# Patient Record
Sex: Female | Born: 1945 | Race: White | Hispanic: No | Smoking: Never smoker
Health system: Southern US, Community
[De-identification: ages and names within clinical notes are randomized; demographics above are authoritative.]

## PROBLEM LIST (undated history)

## (undated) DIAGNOSIS — K7460 Unspecified cirrhosis of liver: Secondary | ICD-10-CM

## (undated) DIAGNOSIS — R0600 Dyspnea, unspecified: Secondary | ICD-10-CM

## (undated) DIAGNOSIS — M109 Gout, unspecified: Secondary | ICD-10-CM

## (undated) DIAGNOSIS — R0609 Other forms of dyspnea: Secondary | ICD-10-CM

## (undated) DIAGNOSIS — E785 Hyperlipidemia, unspecified: Secondary | ICD-10-CM

## (undated) DIAGNOSIS — E119 Type 2 diabetes mellitus without complications: Secondary | ICD-10-CM

## (undated) DIAGNOSIS — E039 Hypothyroidism, unspecified: Secondary | ICD-10-CM

## (undated) DIAGNOSIS — I10 Essential (primary) hypertension: Secondary | ICD-10-CM

## (undated) DIAGNOSIS — R000 Tachycardia, unspecified: Secondary | ICD-10-CM

## (undated) DIAGNOSIS — K31819 Angiodysplasia of stomach and duodenum without bleeding: Secondary | ICD-10-CM

## (undated) HISTORY — DX: Tachycardia, unspecified: R00.0

## (undated) HISTORY — DX: Type 2 diabetes mellitus without complications: E11.9

## (undated) HISTORY — DX: Other forms of dyspnea: R06.09

## (undated) HISTORY — DX: Dyspnea, unspecified: R06.00

## (undated) HISTORY — DX: Angiodysplasia of stomach and duodenum without bleeding: K31.819

## (undated) HISTORY — DX: Essential (primary) hypertension: I10

## (undated) HISTORY — DX: Hypothyroidism, unspecified: E03.9

## (undated) HISTORY — DX: Gout, unspecified: M10.9

## (undated) HISTORY — PX: BREAST REDUCTION SURGERY: SHX8

## (undated) HISTORY — DX: Unspecified cirrhosis of liver: K74.60

## (undated) HISTORY — PX: REDUCTION MAMMAPLASTY: SUR839

## (undated) HISTORY — DX: Hyperlipidemia, unspecified: E78.5

## (undated) HISTORY — PX: OTHER SURGICAL HISTORY: SHX169

## (undated) HISTORY — PX: CHOLECYSTECTOMY: SHX55

---

## 2000-03-07 ENCOUNTER — Encounter: Payer: Self-pay | Admitting: Family Medicine

## 2000-03-07 ENCOUNTER — Encounter: Admission: RE | Admit: 2000-03-07 | Discharge: 2000-03-07 | Payer: Self-pay | Admitting: Family Medicine

## 2001-03-13 ENCOUNTER — Encounter: Payer: Self-pay | Admitting: Family Medicine

## 2001-03-13 ENCOUNTER — Encounter: Admission: RE | Admit: 2001-03-13 | Discharge: 2001-03-13 | Payer: Self-pay | Admitting: Family Medicine

## 2002-03-18 ENCOUNTER — Encounter: Payer: Self-pay | Admitting: Family Medicine

## 2002-03-18 ENCOUNTER — Encounter: Admission: RE | Admit: 2002-03-18 | Discharge: 2002-03-18 | Payer: Self-pay | Admitting: Family Medicine

## 2003-03-27 ENCOUNTER — Encounter: Payer: Self-pay | Admitting: Internal Medicine

## 2003-03-27 ENCOUNTER — Encounter: Admission: RE | Admit: 2003-03-27 | Discharge: 2003-03-27 | Payer: Self-pay | Admitting: Internal Medicine

## 2004-03-28 ENCOUNTER — Ambulatory Visit (HOSPITAL_COMMUNITY): Admission: RE | Admit: 2004-03-28 | Discharge: 2004-03-28 | Payer: Self-pay | Admitting: Internal Medicine

## 2004-03-30 ENCOUNTER — Encounter (INDEPENDENT_AMBULATORY_CARE_PROVIDER_SITE_OTHER): Payer: Self-pay | Admitting: Specialist

## 2004-03-30 ENCOUNTER — Encounter: Admission: RE | Admit: 2004-03-30 | Discharge: 2004-03-30 | Payer: Self-pay | Admitting: Internal Medicine

## 2004-09-20 ENCOUNTER — Ambulatory Visit: Payer: Self-pay | Admitting: Internal Medicine

## 2005-05-01 ENCOUNTER — Ambulatory Visit (HOSPITAL_COMMUNITY): Admission: RE | Admit: 2005-05-01 | Discharge: 2005-05-01 | Payer: Self-pay | Admitting: Internal Medicine

## 2005-07-20 ENCOUNTER — Ambulatory Visit: Payer: Self-pay | Admitting: Internal Medicine

## 2006-05-25 ENCOUNTER — Ambulatory Visit (HOSPITAL_COMMUNITY): Admission: RE | Admit: 2006-05-25 | Discharge: 2006-05-25 | Payer: Self-pay | Admitting: Family Medicine

## 2007-05-28 ENCOUNTER — Ambulatory Visit (HOSPITAL_COMMUNITY): Admission: RE | Admit: 2007-05-28 | Discharge: 2007-05-28 | Payer: Self-pay | Admitting: Family Medicine

## 2008-06-09 ENCOUNTER — Ambulatory Visit (HOSPITAL_COMMUNITY): Admission: RE | Admit: 2008-06-09 | Discharge: 2008-06-09 | Payer: Self-pay | Admitting: Family Medicine

## 2009-07-28 ENCOUNTER — Ambulatory Visit (HOSPITAL_COMMUNITY): Admission: RE | Admit: 2009-07-28 | Discharge: 2009-07-28 | Payer: Self-pay | Admitting: Family Medicine

## 2010-08-02 ENCOUNTER — Ambulatory Visit (HOSPITAL_COMMUNITY): Admission: RE | Admit: 2010-08-02 | Discharge: 2010-08-02 | Payer: Self-pay | Admitting: Family Medicine

## 2010-11-07 ENCOUNTER — Encounter
Admission: RE | Admit: 2010-11-07 | Discharge: 2010-11-07 | Payer: Self-pay | Source: Home / Self Care | Attending: Obstetrics and Gynecology | Admitting: Obstetrics and Gynecology

## 2011-01-02 ENCOUNTER — Other Ambulatory Visit: Payer: Self-pay | Admitting: Cardiovascular Disease

## 2011-01-02 DIAGNOSIS — I10 Essential (primary) hypertension: Secondary | ICD-10-CM

## 2011-01-03 ENCOUNTER — Other Ambulatory Visit: Payer: Self-pay | Admitting: *Deleted

## 2011-01-03 DIAGNOSIS — I10 Essential (primary) hypertension: Secondary | ICD-10-CM

## 2011-01-03 NOTE — Telephone Encounter (Signed)
Pt requesting refill on

## 2011-01-05 NOTE — Telephone Encounter (Signed)
[facility] Cardiology

## 2011-07-18 ENCOUNTER — Other Ambulatory Visit: Payer: Self-pay | Admitting: Family Medicine

## 2011-07-18 DIAGNOSIS — R7989 Other specified abnormal findings of blood chemistry: Secondary | ICD-10-CM

## 2011-07-20 ENCOUNTER — Ambulatory Visit
Admission: RE | Admit: 2011-07-20 | Discharge: 2011-07-20 | Disposition: A | Discharge status: Home / Self Care | Payer: Medicare Other | Source: Ambulatory Visit | Attending: Family Medicine | Admitting: Family Medicine

## 2011-07-20 DIAGNOSIS — R7989 Other specified abnormal findings of blood chemistry: Secondary | ICD-10-CM

## 2012-01-06 ENCOUNTER — Other Ambulatory Visit: Payer: Self-pay | Admitting: Cardiovascular Disease

## 2012-01-08 NOTE — Telephone Encounter (Signed)
Pt needs appointment then refill can be made Fax Received. Refill Completed. Alvis Pulcini Chowoe (R.M.A)   

## 2012-03-21 ENCOUNTER — Encounter: Payer: Self-pay | Admitting: *Deleted

## 2012-04-17 ENCOUNTER — Other Ambulatory Visit (HOSPITAL_COMMUNITY): Payer: Self-pay | Admitting: Family Medicine

## 2012-04-17 DIAGNOSIS — Z1231 Encounter for screening mammogram for malignant neoplasm of breast: Secondary | ICD-10-CM

## 2012-05-07 ENCOUNTER — Ambulatory Visit (HOSPITAL_COMMUNITY)
Admission: RE | Admit: 2012-05-07 | Discharge: 2012-05-07 | Disposition: A | Discharge status: Home / Self Care | Payer: Medicare Other | Source: Ambulatory Visit | Attending: Family Medicine | Admitting: Family Medicine

## 2012-05-07 DIAGNOSIS — Z1231 Encounter for screening mammogram for malignant neoplasm of breast: Secondary | ICD-10-CM | POA: Insufficient documentation

## 2012-12-02 ENCOUNTER — Encounter: Payer: Self-pay | Admitting: Cardiovascular Disease

## 2013-06-24 ENCOUNTER — Other Ambulatory Visit (HOSPITAL_COMMUNITY): Payer: Self-pay | Admitting: Family Medicine

## 2013-06-24 DIAGNOSIS — Z1231 Encounter for screening mammogram for malignant neoplasm of breast: Secondary | ICD-10-CM

## 2013-07-03 ENCOUNTER — Ambulatory Visit (HOSPITAL_COMMUNITY)
Admission: RE | Admit: 2013-07-03 | Discharge: 2013-07-03 | Disposition: A | Discharge status: Home / Self Care | Payer: Medicare Other | Source: Ambulatory Visit | Attending: Family Medicine | Admitting: Family Medicine

## 2013-07-03 ENCOUNTER — Other Ambulatory Visit (HOSPITAL_COMMUNITY): Payer: Self-pay | Admitting: Family Medicine

## 2013-07-03 DIAGNOSIS — Z1231 Encounter for screening mammogram for malignant neoplasm of breast: Secondary | ICD-10-CM

## 2013-07-16 ENCOUNTER — Encounter: Payer: Self-pay | Admitting: Gastroenterology

## 2014-07-23 ENCOUNTER — Other Ambulatory Visit (HOSPITAL_COMMUNITY): Payer: Self-pay | Admitting: Family Medicine

## 2014-07-23 DIAGNOSIS — Z1231 Encounter for screening mammogram for malignant neoplasm of breast: Secondary | ICD-10-CM

## 2014-08-05 ENCOUNTER — Ambulatory Visit (HOSPITAL_COMMUNITY)
Admission: RE | Admit: 2014-08-05 | Discharge: 2014-08-05 | Disposition: A | Discharge status: Home / Self Care | Payer: Medicare HMO | Source: Ambulatory Visit | Attending: Family Medicine | Admitting: Family Medicine

## 2014-08-05 DIAGNOSIS — Z1231 Encounter for screening mammogram for malignant neoplasm of breast: Secondary | ICD-10-CM

## 2015-12-03 ENCOUNTER — Other Ambulatory Visit: Payer: Self-pay | Admitting: Family Medicine

## 2015-12-03 DIAGNOSIS — E2839 Other primary ovarian failure: Secondary | ICD-10-CM

## 2015-12-03 DIAGNOSIS — Z1231 Encounter for screening mammogram for malignant neoplasm of breast: Secondary | ICD-10-CM

## 2015-12-03 DIAGNOSIS — R5381 Other malaise: Secondary | ICD-10-CM

## 2015-12-28 ENCOUNTER — Ambulatory Visit
Admission: RE | Admit: 2015-12-28 | Discharge: 2015-12-28 | Disposition: A | Discharge status: Home / Self Care | Payer: Medicare Other | Source: Ambulatory Visit | Attending: Family Medicine | Admitting: Family Medicine

## 2015-12-28 DIAGNOSIS — E2839 Other primary ovarian failure: Secondary | ICD-10-CM

## 2015-12-28 DIAGNOSIS — Z1231 Encounter for screening mammogram for malignant neoplasm of breast: Secondary | ICD-10-CM

## 2017-08-21 ENCOUNTER — Other Ambulatory Visit: Payer: Self-pay | Admitting: Family Medicine

## 2017-08-21 DIAGNOSIS — Z1231 Encounter for screening mammogram for malignant neoplasm of breast: Secondary | ICD-10-CM

## 2017-09-20 ENCOUNTER — Ambulatory Visit: Payer: Medicare Other

## 2017-10-19 ENCOUNTER — Ambulatory Visit
Admission: RE | Admit: 2017-10-19 | Discharge: 2017-10-19 | Disposition: A | Discharge status: Home / Self Care | Payer: Medicare Other | Source: Ambulatory Visit | Attending: Family Medicine | Admitting: Family Medicine

## 2017-10-19 DIAGNOSIS — Z1231 Encounter for screening mammogram for malignant neoplasm of breast: Secondary | ICD-10-CM

## 2019-09-30 ENCOUNTER — Other Ambulatory Visit: Payer: Self-pay | Admitting: Family Medicine

## 2019-09-30 DIAGNOSIS — Z1231 Encounter for screening mammogram for malignant neoplasm of breast: Secondary | ICD-10-CM

## 2020-09-09 ENCOUNTER — Other Ambulatory Visit: Payer: Self-pay | Admitting: Family Medicine

## 2020-09-09 DIAGNOSIS — Z1231 Encounter for screening mammogram for malignant neoplasm of breast: Secondary | ICD-10-CM

## 2020-09-10 ENCOUNTER — Ambulatory Visit
Admission: RE | Admit: 2020-09-10 | Discharge: 2020-09-10 | Disposition: A | Discharge status: Home / Self Care | Payer: Medicare Other | Source: Ambulatory Visit | Attending: Family Medicine | Admitting: Family Medicine

## 2020-09-10 ENCOUNTER — Other Ambulatory Visit: Payer: Self-pay

## 2020-09-10 DIAGNOSIS — Z1231 Encounter for screening mammogram for malignant neoplasm of breast: Secondary | ICD-10-CM

## 2020-10-29 DIAGNOSIS — I10 Essential (primary) hypertension: Secondary | ICD-10-CM | POA: Diagnosis not present

## 2020-10-29 DIAGNOSIS — E782 Mixed hyperlipidemia: Secondary | ICD-10-CM | POA: Diagnosis not present

## 2020-10-29 DIAGNOSIS — E1165 Type 2 diabetes mellitus with hyperglycemia: Secondary | ICD-10-CM | POA: Diagnosis not present

## 2020-10-29 DIAGNOSIS — E1169 Type 2 diabetes mellitus with other specified complication: Secondary | ICD-10-CM | POA: Diagnosis not present

## 2020-10-29 DIAGNOSIS — N1831 Chronic kidney disease, stage 3a: Secondary | ICD-10-CM | POA: Diagnosis not present

## 2020-10-29 DIAGNOSIS — E039 Hypothyroidism, unspecified: Secondary | ICD-10-CM | POA: Diagnosis not present

## 2020-12-03 DIAGNOSIS — E039 Hypothyroidism, unspecified: Secondary | ICD-10-CM | POA: Diagnosis not present

## 2020-12-03 DIAGNOSIS — E1169 Type 2 diabetes mellitus with other specified complication: Secondary | ICD-10-CM | POA: Diagnosis not present

## 2020-12-03 DIAGNOSIS — E1165 Type 2 diabetes mellitus with hyperglycemia: Secondary | ICD-10-CM | POA: Diagnosis not present

## 2020-12-03 DIAGNOSIS — I10 Essential (primary) hypertension: Secondary | ICD-10-CM | POA: Diagnosis not present

## 2020-12-03 DIAGNOSIS — E782 Mixed hyperlipidemia: Secondary | ICD-10-CM | POA: Diagnosis not present

## 2020-12-03 DIAGNOSIS — N1831 Chronic kidney disease, stage 3a: Secondary | ICD-10-CM | POA: Diagnosis not present

## 2021-02-04 DIAGNOSIS — E782 Mixed hyperlipidemia: Secondary | ICD-10-CM | POA: Diagnosis not present

## 2021-02-04 DIAGNOSIS — D649 Anemia, unspecified: Secondary | ICD-10-CM | POA: Diagnosis not present

## 2021-02-04 DIAGNOSIS — E1169 Type 2 diabetes mellitus with other specified complication: Secondary | ICD-10-CM | POA: Diagnosis not present

## 2021-02-04 DIAGNOSIS — E1165 Type 2 diabetes mellitus with hyperglycemia: Secondary | ICD-10-CM | POA: Diagnosis not present

## 2021-02-04 DIAGNOSIS — I10 Essential (primary) hypertension: Secondary | ICD-10-CM | POA: Diagnosis not present

## 2021-02-04 DIAGNOSIS — E039 Hypothyroidism, unspecified: Secondary | ICD-10-CM | POA: Diagnosis not present

## 2021-02-04 DIAGNOSIS — N1831 Chronic kidney disease, stage 3a: Secondary | ICD-10-CM | POA: Diagnosis not present

## 2021-04-29 DIAGNOSIS — Z7984 Long term (current) use of oral hypoglycemic drugs: Secondary | ICD-10-CM | POA: Diagnosis not present

## 2021-04-29 DIAGNOSIS — M109 Gout, unspecified: Secondary | ICD-10-CM | POA: Diagnosis not present

## 2021-04-29 DIAGNOSIS — I10 Essential (primary) hypertension: Secondary | ICD-10-CM | POA: Diagnosis not present

## 2021-04-29 DIAGNOSIS — E782 Mixed hyperlipidemia: Secondary | ICD-10-CM | POA: Diagnosis not present

## 2021-04-29 DIAGNOSIS — Z9181 History of falling: Secondary | ICD-10-CM | POA: Diagnosis not present

## 2021-04-29 DIAGNOSIS — E1169 Type 2 diabetes mellitus with other specified complication: Secondary | ICD-10-CM | POA: Diagnosis not present

## 2021-04-29 DIAGNOSIS — E039 Hypothyroidism, unspecified: Secondary | ICD-10-CM | POA: Diagnosis not present

## 2021-04-30 ENCOUNTER — Encounter (HOSPITAL_COMMUNITY): Payer: Self-pay | Admitting: Student

## 2021-04-30 ENCOUNTER — Other Ambulatory Visit: Payer: Self-pay

## 2021-04-30 ENCOUNTER — Inpatient Hospital Stay (HOSPITAL_COMMUNITY)
Admission: EM | Admit: 2021-04-30 | Discharge: 2021-05-03 | DRG: 378 | Disposition: A | Discharge status: Home / Self Care | Payer: Medicare Other | Source: Ambulatory Visit | Attending: Student | Admitting: Student

## 2021-04-30 DIAGNOSIS — K31811 Angiodysplasia of stomach and duodenum with bleeding: Principal | ICD-10-CM | POA: Diagnosis present

## 2021-04-30 DIAGNOSIS — R195 Other fecal abnormalities: Secondary | ICD-10-CM | POA: Diagnosis not present

## 2021-04-30 DIAGNOSIS — R17 Unspecified jaundice: Secondary | ICD-10-CM | POA: Diagnosis not present

## 2021-04-30 DIAGNOSIS — D649 Anemia, unspecified: Secondary | ICD-10-CM

## 2021-04-30 DIAGNOSIS — Z79890 Hormone replacement therapy (postmenopausal): Secondary | ICD-10-CM | POA: Diagnosis not present

## 2021-04-30 DIAGNOSIS — K31819 Angiodysplasia of stomach and duodenum without bleeding: Secondary | ICD-10-CM | POA: Diagnosis not present

## 2021-04-30 DIAGNOSIS — D696 Thrombocytopenia, unspecified: Secondary | ICD-10-CM | POA: Diagnosis present

## 2021-04-30 DIAGNOSIS — D689 Coagulation defect, unspecified: Secondary | ICD-10-CM | POA: Diagnosis not present

## 2021-04-30 DIAGNOSIS — E876 Hypokalemia: Secondary | ICD-10-CM | POA: Diagnosis present

## 2021-04-30 DIAGNOSIS — Z6833 Body mass index (BMI) 33.0-33.9, adult: Secondary | ICD-10-CM | POA: Diagnosis not present

## 2021-04-30 DIAGNOSIS — Z79899 Other long term (current) drug therapy: Secondary | ICD-10-CM | POA: Diagnosis not present

## 2021-04-30 DIAGNOSIS — W1830XA Fall on same level, unspecified, initial encounter: Secondary | ICD-10-CM | POA: Diagnosis present

## 2021-04-30 DIAGNOSIS — K7460 Unspecified cirrhosis of liver: Secondary | ICD-10-CM | POA: Diagnosis present

## 2021-04-30 DIAGNOSIS — M109 Gout, unspecified: Secondary | ICD-10-CM | POA: Diagnosis not present

## 2021-04-30 DIAGNOSIS — E785 Hyperlipidemia, unspecified: Secondary | ICD-10-CM | POA: Diagnosis present

## 2021-04-30 DIAGNOSIS — Z7982 Long term (current) use of aspirin: Secondary | ICD-10-CM | POA: Diagnosis not present

## 2021-04-30 DIAGNOSIS — Z791 Long term (current) use of non-steroidal anti-inflammatories (NSAID): Secondary | ICD-10-CM | POA: Diagnosis not present

## 2021-04-30 DIAGNOSIS — I10 Essential (primary) hypertension: Secondary | ICD-10-CM | POA: Diagnosis not present

## 2021-04-30 DIAGNOSIS — K769 Liver disease, unspecified: Secondary | ICD-10-CM | POA: Diagnosis not present

## 2021-04-30 DIAGNOSIS — D62 Acute posthemorrhagic anemia: Secondary | ICD-10-CM | POA: Diagnosis present

## 2021-04-30 DIAGNOSIS — E039 Hypothyroidism, unspecified: Secondary | ICD-10-CM | POA: Diagnosis not present

## 2021-04-30 DIAGNOSIS — K922 Gastrointestinal hemorrhage, unspecified: Secondary | ICD-10-CM | POA: Diagnosis not present

## 2021-04-30 DIAGNOSIS — R7989 Other specified abnormal findings of blood chemistry: Secondary | ICD-10-CM | POA: Diagnosis not present

## 2021-04-30 DIAGNOSIS — B159 Hepatitis A without hepatic coma: Secondary | ICD-10-CM | POA: Diagnosis present

## 2021-04-30 DIAGNOSIS — Z20822 Contact with and (suspected) exposure to covid-19: Secondary | ICD-10-CM | POA: Diagnosis present

## 2021-04-30 DIAGNOSIS — Z9049 Acquired absence of other specified parts of digestive tract: Secondary | ICD-10-CM

## 2021-04-30 DIAGNOSIS — Z6834 Body mass index (BMI) 34.0-34.9, adult: Secondary | ICD-10-CM

## 2021-04-30 DIAGNOSIS — E661 Drug-induced obesity: Secondary | ICD-10-CM | POA: Diagnosis not present

## 2021-04-30 DIAGNOSIS — K648 Other hemorrhoids: Secondary | ICD-10-CM | POA: Diagnosis present

## 2021-04-30 DIAGNOSIS — D509 Iron deficiency anemia, unspecified: Secondary | ICD-10-CM | POA: Diagnosis not present

## 2021-04-30 DIAGNOSIS — K760 Fatty (change of) liver, not elsewhere classified: Secondary | ICD-10-CM | POA: Diagnosis present

## 2021-04-30 DIAGNOSIS — E669 Obesity, unspecified: Secondary | ICD-10-CM | POA: Diagnosis present

## 2021-04-30 DIAGNOSIS — Z8249 Family history of ischemic heart disease and other diseases of the circulatory system: Secondary | ICD-10-CM

## 2021-04-30 DIAGNOSIS — K644 Residual hemorrhoidal skin tags: Secondary | ICD-10-CM | POA: Diagnosis present

## 2021-04-30 HISTORY — DX: Gastrointestinal hemorrhage, unspecified: K92.2

## 2021-04-30 LAB — CBC WITH DIFFERENTIAL/PLATELET
Abs Immature Granulocytes: 0.02 10*3/uL (ref 0.00–0.07)
Basophils Absolute: 0.1 10*3/uL (ref 0.0–0.1)
Basophils Relative: 1 %
Eosinophils Absolute: 0.4 10*3/uL (ref 0.0–0.5)
Eosinophils Relative: 5 %
HCT: 27.2 % — ABNORMAL LOW (ref 36.0–46.0)
Hemoglobin: 8.2 g/dL — ABNORMAL LOW (ref 12.0–15.0)
Immature Granulocytes: 0 %
Lymphocytes Relative: 34 %
Lymphs Abs: 2.8 10*3/uL (ref 0.7–4.0)
MCH: 24.8 pg — ABNORMAL LOW (ref 26.0–34.0)
MCHC: 30.1 g/dL (ref 30.0–36.0)
MCV: 82.4 fL (ref 80.0–100.0)
Monocytes Absolute: 0.6 10*3/uL (ref 0.1–1.0)
Monocytes Relative: 8 %
Neutro Abs: 4.2 10*3/uL (ref 1.7–7.7)
Neutrophils Relative %: 52 %
Platelets: 165 10*3/uL (ref 150–400)
RBC: 3.3 MIL/uL — ABNORMAL LOW (ref 3.87–5.11)
RDW: 20.2 % — ABNORMAL HIGH (ref 11.5–15.5)
WBC: 8.1 10*3/uL (ref 4.0–10.5)
nRBC: 0 % (ref 0.0–0.2)

## 2021-04-30 LAB — COMPREHENSIVE METABOLIC PANEL
ALT: 31 U/L (ref 0–44)
AST: 69 U/L — ABNORMAL HIGH (ref 15–41)
Albumin: 2.8 g/dL — ABNORMAL LOW (ref 3.5–5.0)
Alkaline Phosphatase: 89 U/L (ref 38–126)
Anion gap: 9 (ref 5–15)
BUN: 13 mg/dL (ref 8–23)
CO2: 21 mmol/L — ABNORMAL LOW (ref 22–32)
Calcium: 9.2 mg/dL (ref 8.9–10.3)
Chloride: 108 mmol/L (ref 98–111)
Creatinine, Ser: 0.87 mg/dL (ref 0.44–1.00)
GFR, Estimated: >60 mL/min (ref >60)
Glucose, Bld: 121 mg/dL — ABNORMAL HIGH (ref 70–99)
Potassium: 3.9 mmol/L (ref 3.5–5.1)
Sodium: 138 mmol/L (ref 135–145)
Total Bilirubin: 3.5 mg/dL — ABNORMAL HIGH (ref 0.3–1.2)
Total Protein: 6.8 g/dL (ref 6.5–8.1)

## 2021-04-30 LAB — SAMPLE TO BLOOD BANK

## 2021-04-30 LAB — POC OCCULT BLOOD, ED: Fecal Occult Bld: POSITIVE — AB

## 2021-04-30 NOTE — H&P (Signed)
History and Physical    Ann Mcconnell [id number] DOB: [birth year] DOA: 04/30/2021  PCP: Maury Dus, MD  Patient coming from: Home  I have personally briefly reviewed patient's old medical records in [facility].  Chief Complaint: low hemoglobin, sent by doctor's office  HPI: Ann Mcconnell is a 75 y.o. female with medical history significant for hypothyroidism, hypertension, who presents to the emergency department on 04/30/2021 due to being sent by her doctor's office because of a finding of low hemoglobin on labs.  Patient initially went to her doctor's office due to progressively worsening fatigue and dyspnea on exertion.  She reports the fatigue and dyspnea began a year ago but they have both been progressively worsening and constantly present. Symptoms are alleviated by nothing and exacerbated by exertion/walking. Associated symptoms: She has decreased appetite and decreased oral intake but no weight loss. She has lightheadedness. Occasional palpitations especially after walking. No chest pain. No cough or wheezing. No abdominal pain, nausea, vomiting, diarrhea, bloody stool, or black/tarry stool. She has leg swelling that has been present for awhile. She feels unsteady on her feet and fell about 1 week ago, was unable to get up without assistance. She was evaluated by primary care and had labs drawn. On the day of admission she was called and told to go to the emergency department because her hemoglobin was low. Patient reports no history of Gi bleeding, GERD, or anemia.She had a colonoscopy in the past by [facility]; she thinks it was normal. She had an EGD in the past and does not think there were any abnormalities.    ED Course: Hemoglobin was 8.2, Total bilirubin 3.5. Patient was hemoccult positive.   Review of Systems: As per HPI otherwise all other systems reviewed and are unremarable.  NEUROLOGICAL: No headache or focal weakness.   Past Medical History:  Diagnosis  Date   DOE (dyspnea on exertion)    Gout    HLD (hyperlipidemia)    HTN (hypertension)    Hypothyroidism    Tachycardia     Past Surgical History:  Procedure Laterality Date   Arm Surgery     BREAST REDUCTION SURGERY     CHOLECYSTECTOMY     REDUCTION MAMMAPLASTY      Social History  reports that she has never smoked. She has never used smokeless tobacco. She reports that she does not drink alcohol and does not use drugs.  No Known Allergies  Family History  Problem Relation Age of Onset   Heart attack Other    Lymphoma Other    COPD Other    Thyroid disease Other    Sleep apnea Other      Home Medications  Prior to Admission medications   Medication Sig Start Date End Date Taking? Authorizing Provider  allopurinol (ZYLOPRIM) 300 MG tablet Take 300 mg by mouth daily.    [provider]  amLODipine (NORVASC) 5 MG tablet Take 5 mg by mouth daily.    [provider]  aspirin 81 MG tablet Take 81 mg by mouth daily.    [provider]  busPIRone (BUSPAR) 10 MG tablet Take 10 mg by mouth daily.    [provider]  indomethacin (INDOCIN) 50 MG capsule Take 50 mg by mouth 3 (three) times daily with meals.    [provider]  levothyroxine (SYNTHROID, LEVOTHROID) 50 MCG tablet Take 50 mcg by mouth daily.    [provider]  lisinopril (PRINIVIL,ZESTRIL) 20 MG tablet TAKE ONE  TABLET BY MOUTH TWICE DAILY 01/06/12   Nahser, Wonda Cheng, MD  metoprolol succinate (TOPROL-XL) 50 MG 24 hr tablet Take 25 mg by mouth daily. Take with or immediately following a meal.    [provider]  pravastatin (PRAVACHOL) 40 MG tablet Take 40 mg by mouth daily.    [provider]    Physical Exam: Vitals:   04/30/21 1316 04/30/21 1318 04/30/21 1716 04/30/21 2219  BP: 120/61  (!) 148/105 (!) 154/69  Pulse: 66  60 62  Resp: '18  18 16  '$ Temp: 98.7 F (37.1 C)     TempSrc: Oral     SpO2: 99%  100% 100%  Weight:  99.8 kg     Height:  '5\' 7"'$  (1.702 m)      Constitutional: NAD, calm, comfortable, ill-appearing. Vitals:   04/30/21 1316 04/30/21 1318 04/30/21 1716 04/30/21 2219  BP: 120/61  (!) 148/105 (!) 154/69  Pulse: 66  60 62  Resp: '18  18 16  '$ Temp: 98.7 F (37.1 C)     TempSrc: Oral     SpO2: 99%  100% 100%  Weight:  99.8 kg    Height:  '5\' 7"'$  (1.702 m)     Eyes: Pupils equal and round, lids and conjunctivae without icterus or erythema. ENMT: Mucous membranes are dry. Posterior pharynx clear of any exudate or lesions. Nares patent without discharge or bleeding.  Normocephalic, atraumatic.  Normal dentition.  Neck: normal, supple, no masses, trachea midline.  Thyroid nontender, no masses appreciated, no thyromegaly. Respiratory: clear to auscultation bilaterally. Chest wall movements are symmetric. No wheezing, no crackles.  No rhonchi.  Normal respiratory effort. No accessory muscle use.  Cardiovascular: Regular rate and rhythm, no murmurs / rubs / gallops. Pulses: DP pulses 2+ bilaterally. No carotid bruits.  Capillary refill less than 3 seconds. Edema: 1+ bilaterally. Murmur 2/6 systolic. GI: soft, non-distended, normal active bowel sounds. No hepatosplenomegaly. No rigidity, rebound, or guarding. Non-tender. No masses palpated.  Musculoskeletal: no clubbing / cyanosis. No joint deformity upper and lower extremities. Good ROM, no contractures. Normal muscle tone.  No tenderness or deformity in the back bilaterally. Integument: no rashes, lesions, ulcers. No induration. Clean, dry, intact. Pale. Neurologic: CN 2-12 grossly intact. Sensation grossly intact to light touch. DTR 2+ bilaterally.  Babinski: Toes downgoing bilaterally.  Strength 5/5 in all 4.  Intact rapid alternating movements bilaterally.  No pronator drift. Psychiatric: Normal judgment and insight. Alert and oriented x 3. Normal mood.  Normal and appropriate affect. Lymphatic: No cervical lymphadenopathy. No supraclavicular  lymphadenopathy.   Labs on Admission: I have personally reviewed the following labs and imaging studies.  CBC: Recent Labs  Lab 04/30/21 1411  WBC 8.1  NEUTROABS 4.2  HGB 8.2*  HCT 27.2*  MCV 82.4  PLT 123XX123    Basic Metabolic Panel: Recent Labs  Lab 04/30/21 1411  NA 138  K 3.9  CL 108  CO2 21*  GLUCOSE 121*  BUN 13  CREATININE 0.87  CALCIUM 9.2    GFR: Estimated Creatinine Clearance: 67.8 mL/min (by C-G formula based on SCr of 0.87 mg/dL).  Liver Function Tests: Recent Labs  Lab 04/30/21 1411  AST 69*  ALT 31  ALKPHOS 89  BILITOT 3.5*  PROT 6.8  ALBUMIN 2.8*     Radiological Exams on Admission: No results found. No imaging on admission.   Assessment/Plan Principal Problem:   Acute upper GI bleed Active Problems:   Anemia associated with acute blood loss  Elevated bilirubin   Hypothyroidism (acquired)   Primary hypertension   Gout    Principal Problem:    Acute upper GI bleed May be the cause of some of her symptoms. Has dyspnea on exertion. Patient is significantly symptomatic at this time. Hemoglobin level is low.  Plan:   Start IV pantoprazole q12 hours.  Monitor H/H.  Consult GI. If Hemoglobin is less than 7, then: Transfuse PRBCs.    Active Problems:    Anemia associated with acute blood loss Plan: Monitor H/H.  If Hemoglobin is less than 7, then: Transfuse PRBCs.     Elevated bilirubin Plan: GI consult. Recheck in AM.    Hypothyroidism (acquired) Plan: Synthroid.    Primary hypertension Plan: Continue home medications.    Gout Plan: Continue home allopurinol.    DVT prophylaxis: Lovenox.  Code Status:   Full Code Family Communication:  with granddaughter at bedside    Disposition Plan:   Patient is from:  Home  Anticipated DC to:  Home  Anticipated DC date:  05/01/21  Anticipated DC barriers: Timing of GI procedures and results  Consults called:  Call GI consult in AM. Patient reports that although she is a  patient of [facility] that she had a colonoscopy by [facility].   Admission status:  Observation   Severity of Illness: The appropriate patient status for this patient is OBSERVATION. Observation status is judged to be reasonable and necessary in order to provide the required intensity of service to ensure the patient's safety. The patient's presenting symptoms, physical exam findings, and initial radiographic and laboratory data in the context of their medical condition is felt to place them at decreased risk for further clinical deterioration. Furthermore, it is anticipated that the patient will be medically stable for discharge from the hospital within 2 midnights of admission. The following factors support the patient status of observation.   " The patient's presenting symptoms include dyspnea, lightheadedness, fatigue. " The physical exam findings include pale. " The initial radiographic and laboratory data are notable for Hb 8.2 and hemoccult positive stool.    Tacey Ruiz MD [facility]  How to contact the [facility] Attending or Consulting provider [place] or covering provider during after hours [place], for this patient?   Check the care team in [facility] and look for a) attending/consulting [facility] provider listed and b) the [facility] team listed Log into [email or web address] and use [place]'s universal password to access. If you do not have the password, please contact the hospital operator. Locate the [facility] provider you are looking for under [facility] and page to a number that you can be directly reached. If you still have difficulty reaching the provider, please page the [facility] (Director on Call) for the Hospitalists listed on amion for assistance.  04/30/2021, 11:01 PM

## 2021-04-30 NOTE — ED Triage Notes (Signed)
States she was referred to the ED by EMS, was experiencing some weakness so she went to her PCP yesterday, got a call that her HMG, RBCs and other labs were abnormal and she should come to the ED. Denies blood in stool or abd pain.

## 2021-04-30 NOTE — ED Provider Notes (Signed)
[facility] DEPT Provider Note   CSN: [id number] Arrival date & time: 04/30/21  1256     History Chief Complaint  Patient presents with   abnormal labs    Ann Mcconnell is a 75 y.o. female.  Patient presents to the emergency department with a chief complaint of low hemoglobin.  She was notified by her doctor's office that her hemoglobin was low yesterday.  She states that she had blood work done after feeling fatigued.  She also reports shortness of breath with activity.  She states that if she stands for too long, her heart races.  States that she became weak and fell to the ground approximately 1 week ago.  She denies seeing any dark, black, or bloody stools.  She takes a baby aspirin daily, but is not otherwise anticoagulated.  She states that last colonoscopy was performed by [facility].  The history is provided by the patient. No language interpreter was used.      Past Medical History:  Diagnosis Date   DOE (dyspnea on exertion)    Gout    HLD (hyperlipidemia)    HTN (hypertension)    Hypothyroidism    Tachycardia     There are no problems to display for this patient.   Past Surgical History:  Procedure Laterality Date   Arm Surgery     BREAST REDUCTION SURGERY     CHOLECYSTECTOMY     REDUCTION MAMMAPLASTY       OB History   No obstetric history on file.     Family History  Problem Relation Age of Onset   Heart attack Other    Lymphoma Other    COPD Other    Thyroid disease Other    Sleep apnea Other     Social History   Tobacco Use   Smoking status: Never  Substance Use Topics   Alcohol use: No    Home Medications Prior to Admission medications   Medication Sig Start Date End Date Taking? Authorizing Provider  allopurinol (ZYLOPRIM) 300 MG tablet Take 300 mg by mouth daily.    [provider]  amLODipine (NORVASC) 5 MG tablet Take 5 mg by mouth daily.    [provider]  aspirin 81 MG  tablet Take 81 mg by mouth daily.    [provider]  busPIRone (BUSPAR) 10 MG tablet Take 10 mg by mouth daily.    [provider]  indomethacin (INDOCIN) 50 MG capsule Take 50 mg by mouth 3 (three) times daily with meals.    [provider]  levothyroxine (SYNTHROID, LEVOTHROID) 50 MCG tablet Take 50 mcg by mouth daily.    [provider]  lisinopril (PRINIVIL,ZESTRIL) 20 MG tablet TAKE ONE TABLET BY MOUTH TWICE DAILY 01/06/12   Nahser, Wonda Cheng, MD  metoprolol succinate (TOPROL-XL) 50 MG 24 hr tablet Take 25 mg by mouth daily. Take with or immediately following a meal.    [provider]  pravastatin (PRAVACHOL) 40 MG tablet Take 40 mg by mouth daily.    [provider]    Allergies    Patient has no known allergies.  Review of Systems   Review of Systems  All other systems reviewed and are negative.  Physical Exam Updated Vital Signs BP (!) 154/69   Pulse 62   Temp 98.7 F (37.1 C) (Oral)   Resp 16   Ht '5\' 7"'$  (1.702 m)   Wt 99.8 kg   SpO2 100%  BMI 34.46 kg/m   Physical Exam Vitals and nursing note reviewed.  Constitutional:      General: She is not in acute distress.    Appearance: She is well-developed.  HENT:     Head: Normocephalic and atraumatic.  Eyes:     Conjunctiva/sclera: Conjunctivae normal.  Cardiovascular:     Rate and Rhythm: Normal rate and regular rhythm.     Heart sounds: No murmur heard. Pulmonary:     Effort: Pulmonary effort is normal. No respiratory distress.     Breath sounds: Normal breath sounds.  Abdominal:     Palpations: Abdomen is soft.     Tenderness: There is no abdominal tenderness.  Genitourinary:    CommentsAsencion Partridge, nurse tech, chaperone present for DRE Musculoskeletal:     Cervical back: Neck supple.  Skin:    General: Skin is warm and dry.  Neurological:     Mental Status: She is alert and oriented to person, place, and time.  Psychiatric:        Mood and Affect: Mood  normal.        Behavior: Behavior normal.    ED Results / Procedures / Treatments   Labs (all labs ordered are listed, but only abnormal results are displayed) Labs Reviewed  COMPREHENSIVE METABOLIC PANEL - Abnormal; Notable for the following components:      Result Value   CO2 21 (*)    Glucose, Bld 121 (*)    Albumin 2.8 (*)    AST 69 (*)    Total Bilirubin 3.5 (*)    All other components within normal limits  CBC WITH DIFFERENTIAL/PLATELET - Abnormal; Notable for the following components:   RBC 3.30 (*)    Hemoglobin 8.2 (*)    HCT 27.2 (*)    MCH 24.8 (*)    RDW 20.2 (*)    All other components within normal limits  POC OCCULT BLOOD, ED - Abnormal; Notable for the following components:   Fecal Occult Bld POSITIVE (*)    All other components within normal limits  SAMPLE TO BLOOD BANK    EKG None  Radiology No results found.  Procedures Procedures   Medications Ordered in ED Medications - No data to display  ED Course  I have reviewed the triage vital signs and the nursing notes.  Pertinent labs & imaging results that were available during my care of the patient were reviewed by me and considered in my medical decision making (see chart for details).    MDM Rules/Calculators/A&P                           Patient here with weakness and fatigue.  Notified by her doctor's office yesterday that her hemoglobin was low.  Had a fall due to weakness approximately 1 week ago.  Reports shortness of breath and racing heart with activity.  Hemoglobin is noted to be 8.2.  She is Hemoccult positive.  Last colonoscopy on record was with  and she states that she has used [facility] despite her PCP being with [facility].  Will consult hospitalist for admission for GI bleed and symptomatic anemia.  Consultants: Appreciate Dr. Dena Billet for admitting.   Secure chat sent to Dr. Havery Moros, [facility], requesting AM consult. Final Clinical Impression(s) / ED Diagnoses Final  diagnoses:  Gastrointestinal hemorrhage, unspecified gastrointestinal hemorrhage type  Symptomatic anemia    Rx / DC Orders ED Discharge Orders     None  Montine Circle, PA-C 04/30/21 2306    Regan Lemming, MD 04/30/21 [phone]

## 2021-04-30 NOTE — ED Provider Notes (Signed)
Emergency Medicine Provider Triage Evaluation Note  Ann Mcconnell , a 75 y.o. female  was evaluated in triage.  Pt complains of abnormal labs. Received call from PCP that hgb & plt were low and told to come to the ED. Has had weeks of fatigue, feels dizzy with activity sometimes.  Review of Systems  Positive: Fatigue, dizzy Negative: Chest pain, syncope, melena, hematochezia.   Physical Exam  BP 120/61 (BP Location: Right Arm)   Pulse 66   Temp 98.7 F (37.1 C) (Oral)   Resp 18   Ht '5\' 7"'$  (1.702 m)   Wt 99.8 kg   SpO2 99%   BMI 34.46 kg/m  Gen:   Awake, no distress   Resp:  Normal effort  MSK:   Moves extremities without difficulty   Medical Decision Making  Medically screening exam initiated at 1:22 PM.  Appropriate orders placed.  Ann Mcconnell was informed that the remainder of the evaluation will be completed by another provider, this initial triage assessment does not replace that evaluation, and the importance of remaining in the ED until their evaluation is complete.  Abnormal labs outpatient- unable to view labs in chart- basic labs ordered, sample sent to blood bank for possible type and screen.    Leafy Kindle 04/30/21 1324    Regan Lemming, MD 04/30/21 1437

## 2021-05-01 ENCOUNTER — Observation Stay (HOSPITAL_COMMUNITY): Payer: Medicare Other

## 2021-05-01 ENCOUNTER — Encounter (HOSPITAL_COMMUNITY): Payer: Self-pay | Admitting: Internal Medicine

## 2021-05-01 DIAGNOSIS — K7460 Unspecified cirrhosis of liver: Secondary | ICD-10-CM | POA: Diagnosis not present

## 2021-05-01 DIAGNOSIS — Z9049 Acquired absence of other specified parts of digestive tract: Secondary | ICD-10-CM | POA: Diagnosis not present

## 2021-05-01 DIAGNOSIS — R195 Other fecal abnormalities: Secondary | ICD-10-CM | POA: Insufficient documentation

## 2021-05-01 DIAGNOSIS — K769 Liver disease, unspecified: Secondary | ICD-10-CM | POA: Diagnosis not present

## 2021-05-01 DIAGNOSIS — D649 Anemia, unspecified: Secondary | ICD-10-CM | POA: Diagnosis not present

## 2021-05-01 DIAGNOSIS — R7989 Other specified abnormal findings of blood chemistry: Secondary | ICD-10-CM | POA: Diagnosis not present

## 2021-05-01 LAB — HEMOGLOBIN AND HEMATOCRIT, BLOOD
HCT: 25.8 % — ABNORMAL LOW (ref 36.0–46.0)
HCT: 30.6 % — ABNORMAL LOW (ref 36.0–46.0)
Hemoglobin: 7.9 g/dL — ABNORMAL LOW (ref 12.0–15.0)
Hemoglobin: 9.1 g/dL — ABNORMAL LOW (ref 12.0–15.0)

## 2021-05-01 LAB — COMPREHENSIVE METABOLIC PANEL
ALT: 26 U/L (ref 0–44)
AST: 59 U/L — ABNORMAL HIGH (ref 15–41)
Albumin: 2.3 g/dL — ABNORMAL LOW (ref 3.5–5.0)
Alkaline Phosphatase: 74 U/L (ref 38–126)
Anion gap: 10 (ref 5–15)
BUN: 16 mg/dL (ref 8–23)
CO2: 20 mmol/L — ABNORMAL LOW (ref 22–32)
Calcium: 8.7 mg/dL — ABNORMAL LOW (ref 8.9–10.3)
Chloride: 107 mmol/L (ref 98–111)
Creatinine, Ser: 0.98 mg/dL (ref 0.44–1.00)
GFR, Estimated: >60 mL/min (ref >60)
Glucose, Bld: 93 mg/dL (ref 70–99)
Potassium: 3.4 mmol/L — ABNORMAL LOW (ref 3.5–5.1)
Sodium: 137 mmol/L (ref 135–145)
Total Bilirubin: 3.1 mg/dL — ABNORMAL HIGH (ref 0.3–1.2)
Total Protein: 5.6 g/dL — ABNORMAL LOW (ref 6.5–8.1)

## 2021-05-01 LAB — CBC
HCT: 22.1 % — ABNORMAL LOW (ref 36.0–46.0)
Hemoglobin: 6.7 g/dL — CL (ref 12.0–15.0)
MCH: 25 pg — ABNORMAL LOW (ref 26.0–34.0)
MCHC: 30.3 g/dL (ref 30.0–36.0)
MCV: 82.5 fL (ref 80.0–100.0)
Platelets: 113 10*3/uL — ABNORMAL LOW (ref 150–400)
RBC: 2.68 MIL/uL — ABNORMAL LOW (ref 3.87–5.11)
RDW: 20.1 % — ABNORMAL HIGH (ref 11.5–15.5)
WBC: 6.1 10*3/uL (ref 4.0–10.5)
nRBC: 0 % (ref 0.0–0.2)

## 2021-05-01 LAB — PROTIME-INR
INR: 1.7 — ABNORMAL HIGH (ref 0.8–1.2)
Prothrombin Time: 20.1 seconds — ABNORMAL HIGH (ref 11.4–15.2)

## 2021-05-01 LAB — LACTATE DEHYDROGENASE: LDH: 99 U/L (ref 98–192)

## 2021-05-01 LAB — FERRITIN: Ferritin: 15 ng/mL (ref 11–307)

## 2021-05-01 LAB — APTT: aPTT: 42 seconds — ABNORMAL HIGH (ref 24–36)

## 2021-05-01 LAB — PREPARE RBC (CROSSMATCH)

## 2021-05-01 LAB — FOLATE: Folate: 11.9 ng/mL (ref >5.9)

## 2021-05-01 LAB — IRON AND TIBC
Iron: 83 ug/dL (ref 28–170)
Saturation Ratios: 23 % (ref 10.4–31.8)
TIBC: 354 ug/dL (ref 250–450)
UIBC: 271 ug/dL

## 2021-05-01 LAB — VITAMIN B12: Vitamin B-12: 1334 pg/mL — ABNORMAL HIGH (ref 180–914)

## 2021-05-01 LAB — BILIRUBIN, DIRECT: Bilirubin, Direct: 1.1 mg/dL — ABNORMAL HIGH (ref 0.0–0.2)

## 2021-05-01 LAB — RETICULOCYTES
Immature Retic Fract: 30.7 % — ABNORMAL HIGH (ref 2.3–15.9)
RBC.: 2.61 MIL/uL — ABNORMAL LOW (ref 3.87–5.11)
Retic Count, Absolute: 49.3 10*3/uL (ref 19.0–186.0)
Retic Ct Pct: 1.9 % (ref 0.4–3.1)

## 2021-05-01 LAB — RESP PANEL BY RT-PCR (FLU A&B, COVID) ARPGX2
Influenza A by PCR: NEGATIVE
Influenza B by PCR: NEGATIVE
SARS Coronavirus 2 by RT PCR: NEGATIVE

## 2021-05-01 LAB — ABO/RH: ABO/RH(D): O POS

## 2021-05-01 LAB — MAGNESIUM: Magnesium: 1.7 mg/dL (ref 1.7–2.4)

## 2021-05-01 LAB — CK: Total CK: 110 U/L (ref 38–234)

## 2021-05-01 MED ORDER — ALLOPURINOL 300 MG PO TABS
300.0000 mg | ORAL_TABLET | Freq: Every day | ORAL | Status: DC
  Administered 2021-05-01 – 2021-05-03 (×3): 300 mg via ORAL
  Filled 2021-05-01 (×3): qty 1

## 2021-05-01 MED ORDER — PRAVASTATIN SODIUM 20 MG PO TABS
40.0000 mg | ORAL_TABLET | Freq: Every day | ORAL | Status: DC
  Administered 2021-05-01 – 2021-05-03 (×3): 40 mg via ORAL
  Filled 2021-05-01 (×3): qty 2

## 2021-05-01 MED ORDER — ONDANSETRON HCL 4 MG/2ML IJ SOLN: 4.0000 mg | Freq: Four times a day (QID) | INTRAMUSCULAR | Status: DC | PRN

## 2021-05-01 MED ORDER — PANTOPRAZOLE SODIUM 40 MG IV SOLR
40.0000 mg | Freq: Two times a day (BID) | INTRAVENOUS | Status: DC
  Administered 2021-05-01 – 2021-05-03 (×6): 40 mg via INTRAVENOUS
  Filled 2021-05-01 (×5): qty 40

## 2021-05-01 MED ORDER — ACETAMINOPHEN 650 MG RE SUPP: 650.0000 mg | Freq: Four times a day (QID) | RECTAL | Status: DC | PRN

## 2021-05-01 MED ORDER — HYDROCODONE-ACETAMINOPHEN 5-325 MG PO TABS: 1.0000 | ORAL_TABLET | ORAL | Status: DC | PRN

## 2021-05-01 MED ORDER — ALLOPURINOL 300 MG PO TABS: 300.0000 mg | ORAL_TABLET | Freq: Every day | ORAL | Status: DC

## 2021-05-01 MED ORDER — AMLODIPINE BESYLATE 5 MG PO TABS
5.0000 mg | ORAL_TABLET | Freq: Every day | ORAL | Status: DC
  Filled 2021-05-01: qty 1

## 2021-05-01 MED ORDER — ACETAMINOPHEN 325 MG PO TABS: 650.0000 mg | ORAL_TABLET | Freq: Four times a day (QID) | ORAL | Status: DC | PRN

## 2021-05-01 MED ORDER — LEVOTHYROXINE SODIUM 50 MCG PO TABS
50.0000 ug | ORAL_TABLET | Freq: Every day | ORAL | Status: DC
  Administered 2021-05-01: 50 ug via ORAL
  Filled 2021-05-01: qty 1

## 2021-05-01 MED ORDER — MAGNESIUM SULFATE 2 GM/50ML IV SOLN
2.0000 g | Freq: Once | INTRAVENOUS | Status: AC
  Administered 2021-05-01: 2 g via INTRAVENOUS
  Filled 2021-05-01: qty 50

## 2021-05-01 MED ORDER — PEG-KCL-NACL-NASULF-NA ASC-C 100 G PO SOLR
0.5000 | Freq: Once | ORAL | Status: AC
  Administered 2021-05-01: 100 g via ORAL
  Filled 2021-05-01: qty 1

## 2021-05-01 MED ORDER — LISINOPRIL 20 MG PO TABS
20.0000 mg | ORAL_TABLET | Freq: Two times a day (BID) | ORAL | Status: DC
  Administered 2021-05-01: 20 mg via ORAL
  Filled 2021-05-01 (×2): qty 1

## 2021-05-01 MED ORDER — SODIUM CHLORIDE 0.9 % IV SOLN: INTRAVENOUS | Status: DC

## 2021-05-01 MED ORDER — PEG-KCL-NACL-NASULF-NA ASC-C 100 G PO SOLR: 1.0000 | Freq: Once | ORAL | Status: DC

## 2021-05-01 MED ORDER — ONDANSETRON HCL 4 MG PO TABS: 4.0000 mg | ORAL_TABLET | Freq: Four times a day (QID) | ORAL | Status: DC | PRN

## 2021-05-01 MED ORDER — SODIUM CHLORIDE 0.9% IV SOLUTION: Freq: Once | INTRAVENOUS | Status: AC

## 2021-05-01 MED ORDER — POTASSIUM CHLORIDE CRYS ER 20 MEQ PO TBCR
40.0000 meq | EXTENDED_RELEASE_TABLET | ORAL | Status: AC
Start: 2021-05-01 — End: 2021-05-01
  Administered 2021-05-01: 40 meq via ORAL
  Filled 2021-05-01: qty 2

## 2021-05-01 MED ORDER — METOPROLOL SUCCINATE ER 25 MG PO TB24
25.0000 mg | ORAL_TABLET | Freq: Every day | ORAL | Status: DC
  Administered 2021-05-02 – 2021-05-03 (×2): 25 mg via ORAL
  Filled 2021-05-01 (×3): qty 1

## 2021-05-01 MED ORDER — MORPHINE SULFATE (PF) 2 MG/ML IV SOLN: 2.0000 mg | INTRAVENOUS | Status: DC | PRN

## 2021-05-01 MED ORDER — SENNOSIDES-DOCUSATE SODIUM 8.6-50 MG PO TABS: 1.0000 | ORAL_TABLET | Freq: Every evening | ORAL | Status: DC | PRN

## 2021-05-01 MED ORDER — BISACODYL 5 MG PO TBEC: 5.0000 mg | DELAYED_RELEASE_TABLET | Freq: Every day | ORAL | Status: DC | PRN

## 2021-05-01 MED ORDER — BUSPIRONE HCL 10 MG PO TABS
10.0000 mg | ORAL_TABLET | Freq: Every day | ORAL | Status: DC
  Administered 2021-05-01 – 2021-05-03 (×3): 10 mg via ORAL
  Filled 2021-05-01 (×3): qty 1

## 2021-05-01 MED ORDER — LEVOTHYROXINE SODIUM 100 MCG PO TABS
100.0000 ug | ORAL_TABLET | Freq: Every day | ORAL | Status: DC
  Administered 2021-05-02 – 2021-05-03 (×2): 100 ug via ORAL
  Filled 2021-05-01 (×2): qty 1

## 2021-05-01 NOTE — Progress Notes (Signed)
PROGRESS NOTE  Ann Mcconnell [id number] DOB: Dec 15, 1945   PCP: Maury Dus, MD  Patient is from: Home.  Lives with husband.  Independent at baseline.  DOA: 04/30/2021 LOS: 0  Chief complaints:  Chief Complaint  Patient presents with   abnormal labs     Brief Narrative / Interim history: 75 year old F with PMH of hypothyroidism, HTN and fatty liver disease directed to ED by PCP for low hemoglobin.  She was seen by PCP for progressive dyspnea on exertion, lightheadedness and fatigue, and had a CBC that was significant for low hemoglobin, and she was directed to ED.  Patient denies melena, hematochezia or easy bruising.  Not on blood thinner or NSAIDs.  She denies GERD or heartburn.   In ED, Hgb 8.2 but dropped further to 6.7.  Hemoccult positive.  Total bili 3.5 with indirect predominance.  GI consulted.  Subjective: Seen and examined earlier this morning.  No major events overnight of this morning.  No complaints other than weakness.  Denies chest pain or shortness of breath at rest.  Denies nausea or vomiting.  Denies melena or hematochezia.  Objective: Vitals:   05/01/21 0923 05/01/21 0923 05/01/21 1049 05/01/21 1050  BP: 120/67 (!) 123/56 (!) 114/57   Pulse: 67 66  67  Resp: 17 18    Temp: 98 F (36.7 C) 98.3 F (36.8 C)    TempSrc: Oral Oral    SpO2: 98% 98%    Weight:      Height:        Intake/Output Summary (Last 24 hours) at 05/01/2021 1055 Last data filed at 05/01/2021 0923 Gross per 24 hour  Intake 630 ml  Output --  Net 630 ml   Filed Weights   04/30/21 1318  Weight: 99.8 kg    Examination:  GENERAL: No apparent distress.  Nontoxic. HEENT: MMM.  Vision and hearing grossly intact.  NECK: Supple.  No apparent JVD.  RESP: 96% on RA.  No IWOB.  Fair aeration bilaterally. CVS:  RRR. Heart sounds normal.  ABD/GI/GU: BS+. Abd soft, NTND.  MSK/EXT:  Moves extremities. No apparent deformity. No edema.  SKIN: no apparent skin lesion or wound NEURO:  Awake, alert and oriented appropriately.  No apparent focal neuro deficit. PSYCH: Calm. Normal affect.   Procedures:  None  Microbiology summarized: [id number] and influenza PCR nonreactive.  Assessment & Plan: Acute blood loss anemia-Unknown baseline.  Differential diagnosis include GI bleed (positive Hemoccult), esophageal varices (possible cirrhosis on RUQ [place]) and hemolysis (hyperbilirubinemia with indirect prominence).  Since she has progressive symptoms over the last 6 months.  Denies melena or hematochezia.  Her normal LDH argues against hemolysis.  Anemia panel with some degree of iron deficiency.  Hemodynamically stable. Recent Labs    04/30/21 1411 05/01/21 0430  HGB 8.2* 6.7*  -Follow haptoglobin. -Save blood smear -Transfuse 1 unit -Monitor H&H -Continue IV Protonix -Appreciate help by GI-plan for EGD and Colo on 7/25  Mildly elevated AST/hyperbilirubinemia/coagulopathy-denies alcohol use.  Has history of fatty liver.  CK within normal.  RUQ ultrasound concerning for liver cirrhosis. -Check CK -Recheck hepatic panel  Hypothyroidism -Check TSH -Continue home Synthroid  Hypokalemia/hypomagnesemia -Replenish and recheck  Thrombocytopenia: Could be due to cirrhosis. Recent Labs  Lab 04/30/21 1411 05/01/21 0430  PLT 165 113*  -Continue monitoring  Class I obesity Body mass index is 34.46 kg/m.         DVT prophylaxis:  SCDs Start: 05/01/21 0319 Place and maintain sequential compression device Start: 04/30/21  2301  Code Status: Full code Family Communication: Patient and/or RN. Available if any question.  Level of care: Med-Surg Status is: Observation  The patient will require care spanning > 2 midnights and should be moved to inpatient because: Ongoing diagnostic testing needed not appropriate for outpatient work up, IV treatments appropriate due to intensity of illness or inability to take PO, and Inpatient level of care appropriate due to severity of  illness  Dispo: The patient is from: Home              Anticipated d/c is to: Home              Patient currently is not medically stable to d/c.   Difficult to place patient No       Consultants:  Gastroenterology   Sch Meds:  Scheduled Meds:  allopurinol  300 mg Oral Daily   busPIRone  10 mg Oral Daily   levothyroxine  50 mcg Oral Daily   metoprolol succinate  25 mg Oral Daily   pantoprazole (PROTONIX) IV  40 mg Intravenous BID   potassium chloride  40 mEq Oral Q4H   pravastatin  40 mg Oral Daily   Continuous Infusions:  magnesium sulfate bolus IVPB     PRN Meds:.acetaminophen **OR** acetaminophen, bisacodyl, HYDROcodone-acetaminophen, morphine injection, ondansetron **OR** ondansetron (ZOFRAN) IV, senna-docusate  Antimicrobials: Anti-infectives (From admission, onward)    None        I have personally reviewed the following labs and images: CBC: Recent Labs  Lab 04/30/21 1411 05/01/21 0430  WBC 8.1 6.1  NEUTROABS 4.2  --   HGB 8.2* 6.7*  HCT 27.2* 22.1*  MCV 82.4 82.5  PLT 165 113*   BMP &GFR Recent Labs  Lab 04/30/21 1411 05/01/21 0430 05/01/21 0720  NA 138 137  --   K 3.9 3.4*  --   CL 108 107  --   CO2 21* 20*  --   GLUCOSE 121* 93  --   BUN 13 16  --   CREATININE 0.87 0.98  --   CALCIUM 9.2 8.7*  --   MG  --   --  1.7   Estimated Creatinine Clearance: 60.2 mL/min (by C-G formula based on SCr of 0.98 mg/dL). Liver & Pancreas: Recent Labs  Lab 04/30/21 1411 05/01/21 0430  AST 69* 59*  ALT 31 26  ALKPHOS 89 74  BILITOT 3.5* 3.1*  PROT 6.8 5.6*  ALBUMIN 2.8* 2.3*   No results for input(s): LIPASE, AMYLASE in the last 168 hours. No results for input(s): AMMONIA in the last 168 hours. Diabetic: No results for input(s): HGBA1C in the last 72 hours. No results for input(s): GLUCAP in the last 168 hours. Cardiac Enzymes: Recent Labs  Lab 05/01/21 0720  CKTOTAL 110   No results for input(s): PROBNP in the last 8760  hours. Coagulation Profile: Recent Labs  Lab 05/01/21 0430  INR 1.7*   Thyroid Function Tests: No results for input(s): TSH, T4TOTAL, FREET4, T3FREE, THYROIDAB in the last 72 hours. Lipid Profile: No results for input(s): CHOL, HDL, LDLCALC, TRIG, CHOLHDL, LDLDIRECT in the last 72 hours. Anemia Panel: Recent Labs    05/01/21 0721  VITAMINB12 1,334*  FOLATE 11.9  FERRITIN 15  TIBC 354  IRON 83  RETICCTPCT 1.9   Urine analysis: No results found for: COLORURINE, APPEARANCEUR, LABSPEC, PHURINE, GLUCOSEU, HGBUR, BILIRUBINUR, KETONESUR, PROTEINUR, UROBILINOGEN, NITRITE, LEUKOCYTESUR Sepsis Labs: Invalid input(s): PROCALCITONIN, LACTICIDVEN  Microbiology: Recent Results (from the past 240 hour(s))  Resp  Panel by RT-PCR (Flu A&B, Covid) Nasopharyngeal Swab     Status: None   Collection Time: 04/30/21 10:47 PM   Specimen: Nasopharyngeal Swab; Nasopharyngeal(NP) swabs in vial transport medium  Result Value Ref Range Status   SARS Coronavirus 2 by RT PCR NEGATIVE NEGATIVE Final    Comment: (NOTE) SARS-CoV-2 target nucleic acids are NOT DETECTED.  The SARS-CoV-2 RNA is generally detectable in upper respiratory specimens during the acute phase of infection. The lowest concentration of SARS-CoV-2 viral copies this assay can detect is 138 copies/mL. A negative result does not preclude SARS-Cov-2 infection and should not be used as the sole basis for treatment or other patient management decisions. A negative result may occur with  improper specimen collection/handling, submission of specimen other than nasopharyngeal swab, presence of viral mutation(s) within the areas targeted by this assay, and inadequate number of viral copies(<138 copies/mL). A negative result must be combined with clinical observations, patient history, and epidemiological information. The expected result is Negative.  Fact Sheet for Patients:  EntrepreneurPulse.com.au  Fact Sheet for  Healthcare Providers:  IncredibleEmployment.be  This test is no t yet approved or cleared by the [place] FDA and  has been authorized for detection and/or diagnosis of SARS-CoV-2 by FDA under an Emergency Use Authorization (EUA). This EUA will remain  in effect (meaning this test can be used) for the duration of the COVID-19 declaration under Section 564(b)(1) of the Act, 21 U.S.C.section 360bbb-3(b)(1), unless the authorization is terminated  or revoked sooner.       Influenza A by PCR NEGATIVE NEGATIVE Final   Influenza B by PCR NEGATIVE NEGATIVE Final    Comment: (NOTE) The Xpert Xpress SARS-CoV-2/FLU/RSV plus assay is intended as an aid in the diagnosis of influenza from Nasopharyngeal swab specimens and should not be used as a sole basis for treatment. Nasal washings and aspirates are unacceptable for Xpert Xpress SARS-CoV-2/FLU/RSV testing.  Fact Sheet for Patients: EntrepreneurPulse.com.au  Fact Sheet for Healthcare Providers: IncredibleEmployment.be  This test is not yet approved or cleared by the [place] FDA and has been authorized for detection and/or diagnosis of SARS-CoV-2 by FDA under an Emergency Use Authorization (EUA). This EUA will remain in effect (meaning this test can be used) for the duration of the COVID-19 declaration under Section 564(b)(1) of the Act, 21 U.S.C. section 360bbb-3(b)(1), unless the authorization is terminated or revoked.  Performed at [facility], [place] [address], [place], [place] [address]     Radiology Studies: US Abdomen Complete  Result Date: 05/01/2021 CLINICAL DATA:  Elevated liver function tests and any EXAM: ABDOMEN ULTRASOUND COMPLETE COMPARISON:  [facility] 07/20/2011 FINDINGS: Gallbladder: History of cholecystectomy Common bile duct: Diameter: 5 mm Liver: Broad fissures and lobulated liver surface. No evidence of mass. Portal vein is patent on  color Doppler imaging with normal direction of blood flow towards the liver. IVC: Limited visualization which is negative for pathologic finding Pancreas: Obscured by bowel gas Spleen: 350 cc volume, upper normal.  No focal abnormality. Right Kidney: Length: 10.6. Echogenicity within normal limits. No mass or hydronephrosis visualized. Left Kidney: Length: 11.4. Echogenicity within normal limits. No mass or hydronephrosis visualized. Abdominal aorta: Obscured by bowel gas. IMPRESSION: 1. No acute finding. 2. Lobulated liver surface that is suspicious for cirrhosis. 3. Bowel gas obscures the aorta and pancreas. Electronically Signed   By: Monte Fantasia M.D.   On: 05/01/2021 09:52       Nathanyl Andujo T. Wellman  If 7PM-7AM, please contact  night-coverage [email or web address] 05/01/2021, 10:55 AM

## 2021-05-01 NOTE — Consult Note (Signed)
 Referring Provider: Dr. Taye Gonfa Primary Care Physician:  Reade, Robert, MD with [facility]  Primary Gastroenterologist:  Dr. Kaplan in 2004  Reason for Consultation: Anemia  HPI: Ann Mcconnell is a 75 y.o. female with a past medical history of hypertension, hyperlipidemia and gout. Past cholecystectomy, breast reduction surgery and left arm surgery.   She developed fatigue approximately 6 months ago which has progressively worsened.  She also noted periods of her heart racing if she stood up for too long for the past few months.  She had a near syncopal episode at home on Friday 7/22 when she became profoundly weak and fell on the floor and laid there for 1-1/2 hours until she had enough strength to stand back up.  She did not lose consciousness.  She was seen by her PCP a few days ago and laboratory studies showed anemia and she was sent directly to [facility] ED 04/30/2021 for further evaluation.  Labs in the ED showed a hemoglobin level of 8.2 (baseline Hg unknown, labs from PCP not in Epic or Care Everywhere). Hematocrit 27.2.  MCV 82.4.  Platelet 165.  WBC 8.1.  BUN 13.  Creatinine 0.87.  Alk phos 89.  Albumin 2.8.  AST 69.  ALT 31.  Total bili 3.5.  SARS coronavirus 2 negative.  FOBT positive.  Her hemoglobin level dropped to 6.7 at 4:30 AM today.  Total bili 3.1.  Direct bili 1.1.  Indirect bili 2.0 indicating hemolysis.  INR 1.7.  PTT 42.  AST down to 59.  Currently, she complains of persistent fatigue.  No chest pain or palpitations at this time.  No shortness of breath.  She has dyspnea with exertion for the past few months.  No cough or hemoptysis.  No dysphagia or heartburn.  No upper or lower abdominal pain.  No nausea or vomiting.  She is passing a normal formed brown bowel movement most days.  No rectal bleeding or black stools.  She stopped taking aspirin 6 months ago because she is thought her blood was too thin.  No NSAID use.  She underwent a colonoscopy  by Dr. Patterson 09/30/2003 which was normal.  She stated undergoing an EGD in 2004 as well, ever, these records are not in epic or Care Everywhere he cannot recall why this procedure was done or what the results showed.  No weight loss.  No fever, sweats or chills.  She is a non-smoker.  No alcohol use.  No family history of esophageal or gastric cancer.  Her son has familial polyposis with at least one cancerous polyp which she reported was paternally driven.  She lives by herself and her son lives in [place].   Past Medical History:  Diagnosis Date   DOE (dyspnea on exertion)    Gout    HLD (hyperlipidemia)    HTN (hypertension)    Hypothyroidism    Tachycardia     Past Surgical History:  Procedure Laterality Date   Arm Surgery     BREAST REDUCTION SURGERY     CHOLECYSTECTOMY     REDUCTION MAMMAPLASTY      Prior to Admission medications   Medication Sig Start Date End Date Taking? Authorizing Provider  allopurinol (ZYLOPRIM) 300 MG tablet Take 300 mg by mouth daily.    [provider]  amLODipine (NORVASC) 5 MG tablet Take 5 mg by mouth daily.    [provider]  aspirin 81 MG tablet Take 81 mg by mouth   daily.    [provider]  busPIRone (BUSPAR) 10 MG tablet Take 10 mg by mouth daily.    [provider]  indomethacin (INDOCIN) 50 MG capsule Take 50 mg by mouth 3 (three) times daily with meals.    [provider]  levothyroxine (SYNTHROID, LEVOTHROID) 50 MCG tablet Take 50 mcg by mouth daily.    [provider]  lisinopril (PRINIVIL,ZESTRIL) 20 MG tablet TAKE ONE TABLET BY MOUTH TWICE DAILY 01/06/12   Nahser, Philip J, MD  metoprolol succinate (TOPROL-XL) 100 MG 24 hr tablet Take 100 mg by mouth daily. Take with or immediately following a meal.    [provider]  pravastatin (PRAVACHOL) 40 MG tablet Take 40 mg by mouth daily.    [provider]    Current Facility-Administered Medications   Medication Dose Route Frequency Provider Last Rate Last Admin   acetaminophen (TYLENOL) tablet 650 mg  650 mg Oral Q6H PRN Garing, Kendall, MD       Or   acetaminophen (TYLENOL) suppository 650 mg  650 mg Rectal Q6H PRN Garing, Kendall, MD       allopurinol (ZYLOPRIM) tablet 300 mg  300 mg Oral Daily Garing, Kendall, MD       amLODipine (NORVASC) tablet 5 mg  5 mg Oral Daily Garing, Kendall, MD       bisacodyl (DULCOLAX) EC tablet 5 mg  5 mg Oral Daily PRN Garing, Kendall, MD       busPIRone (BUSPAR) tablet 10 mg  10 mg Oral Daily Garing, Kendall, MD       HYDROcodone-acetaminophen (NORCO/VICODIN) 5-325 MG per tablet 1-2 tablet  1-2 tablet Oral Q4H PRN Garing, Kendall, MD       levothyroxine (SYNTHROID) tablet 50 mcg  50 mcg Oral Daily Garing, Kendall, MD   50 mcg at 05/01/21 0511   lisinopril (ZESTRIL) tablet 20 mg  20 mg Oral BID Garing, Kendall, MD   20 mg at 05/01/21 0427   metoprolol succinate (TOPROL-XL) 24 hr tablet 25 mg  25 mg Oral Daily Garing, Kendall, MD       morphine 2 MG/ML injection 2 mg  2 mg Intravenous Q2H PRN Garing, Kendall, MD       ondansetron (ZOFRAN) tablet 4 mg  4 mg Oral Q6H PRN Garing, Kendall, MD       Or   ondansetron (ZOFRAN) injection 4 mg  4 mg Intravenous Q6H PRN Garing, Kendall, MD       pantoprazole (PROTONIX) injection 40 mg  40 mg Intravenous BID Garing, Kendall, MD   40 mg at 05/01/21 0059   pravastatin (PRAVACHOL) tablet 40 mg  40 mg Oral Daily Garing, Kendall, MD       senna-docusate (Senokot-S) tablet 1 tablet  1 tablet Oral QHS PRN Garing, Kendall, MD       Current Outpatient Medications  Medication Sig Dispense Refill   allopurinol (ZYLOPRIM) 300 MG tablet Take 300 mg by mouth daily.     amLODipine (NORVASC) 5 MG tablet Take 5 mg by mouth daily.     aspirin 81 MG tablet Take 81 mg by mouth daily.     busPIRone (BUSPAR) 10 MG tablet Take 10 mg by mouth daily.     indomethacin (INDOCIN) 50 MG capsule Take 50 mg by mouth 3 (three) times daily with  meals.     levothyroxine (SYNTHROID, LEVOTHROID) 50 MCG tablet Take 50 mcg by mouth daily.     lisinopril (PRINIVIL,ZESTRIL) 20 MG tablet TAKE ONE   TABLET BY MOUTH TWICE DAILY 60 tablet 0   metoprolol succinate (TOPROL-XL) 100 MG 24 hr tablet Take 100 mg by mouth daily. Take with or immediately following a meal.     pravastatin (PRAVACHOL) 40 MG tablet Take 40 mg by mouth daily.      Allergies as of 04/30/2021   (No Known Allergies)    Family History  Problem Relation Age of Onset   Heart attack Other    Lymphoma Other    COPD Other    Thyroid disease Other    Sleep apnea Other     Social History   Socioeconomic History   Marital status: Married    Spouse name: Not on file   Number of children: 1   Years of education: Not on file   Highest education level: Not on file  Occupational History   Occupation: retired  Tobacco Use   Smoking status: Never   Smokeless tobacco: Never  Substance and Sexual Activity   Alcohol use: No   Drug use: Never   Sexual activity: Not on file  Other Topics Concern   Not on file  Social History Narrative   Not on file   Social Determinants of Health   Financial Resource Strain: Not on file  Food Insecurity: Not on file  Transportation Needs: Not on file  Physical Activity: Not on file  Stress: Not on file  Social Connections: Not on file  Intimate Partner Violence: Not on file    Review of Systems: Gen: Denies fever, sweats or chills. No weight loss.  CV: See HPI. Resp: Denies cough, shortness of breath of hemoptysis.  GI: See HPI. GU : Denies urinary burning, blood in urine, heat urinary frequency or incontinence. MS: Denies joint pain, muscles aches or weakness. Derm: + Itchiness LLE.  Psych: Denies depression, anxiety or memory loss. Heme: Denies easy bruising, bleeding. Neuro:  Denies headaches, dizziness or paresthesias. Endo:  Denies any problems with DM, thyroid or adrenal function.  Physical Exam: Vital signs in last  24 hours: Temp:  [98.1 F (36.7 C)-98.7 F (37.1 C)] 98.1 F (36.7 C) (07/24 0154) Pulse Rate:  [60-70] 70 (07/24 0600) Resp:  [15-20] 15 (07/24 0600) BP: (107-154)/(51-113) 120/56 (07/24 0600) SpO2:  [96 %-100 %] 96 % (07/24 0600) Weight:  [99.8 kg] 99.8 kg (07/23 1318)   General: 75-year-old female fatigued appearing in no acute distress. Head:  Normocephalic and atraumatic. Eyes:  No scleral icterus. Conjunctiva pink. Ears:  Normal auditory acuity. Nose:  No deformity, discharge or lesions. Mouth:  Dentition intact. No ulcers or lesions.  Neck:  Supple. No lymphadenopathy or thyromegaly.  Lungs: Breath sounds clear throughout. Heart: Regular rate and rhythm, no murmurs. Abdomen: Soft, nondistended.  Fullness to the right mid abdomen without discrete palpable mass.  Nontender.  Positive bowel sounds all 4 quadrants.  No HSM. Rectal: Deferred.  FOBT positive. Musculoskeletal:  Symmetrical without gross deformities.  Pulses:  Normal pulses noted. Extremities: Right lower extremity with trace edema.  Left lower extremity with 1+ edema to tibial area with erythema and mild excoriations negative Homans' sign. Neurologic:  Alert and  oriented x4. No focal deficits.  Skin:  Intact without significant lesions or rashes. Psych:  Alert and cooperative. Normal mood and affect.  Intake/Output from previous day: No intake/output data recorded. Intake/Output this shift: No intake/output data recorded.  Lab Results: Recent Labs    04/30/21 1411 05/01/21 0430  WBC 8.1 6.1  HGB 8.2* 6.7*  HCT 27.2* 22.1*    PLT 165 113*   BMET Recent Labs    04/30/21 1411 05/01/21 0430  NA 138 137  K 3.9 3.4*  CL 108 107  CO2 21* 20*  GLUCOSE 121* 93  BUN 13 16  CREATININE 0.87 0.98  CALCIUM 9.2 8.7*   LFT Recent Labs    05/01/21 0430  PROT 5.6*  ALBUMIN 2.3*  AST 59*  ALT 26  ALKPHOS 74  BILITOT 3.1*  BILIDIR 1.1*   PT/INR Recent Labs    05/01/21 0430  LABPROT 20.1*  INR  1.7*   Hepatitis Panel No results for input(s): HEPBSAG, HCVAB, HEPAIGM, HEPBIGM in the last 72 hours.    Studies/Results: No results found.  IMPRESSION/PLAN:  1.  75-year-old with fatigue which has progressively worsened over the past 6 months with generalized weakness, 1 episode of near syncope and heart racing when standing for too long for a few months. Laboratory studies by PCP showed normocytic anemia. Admission hemoglobin 8.2 (baseline hemoglobin level unknown, comparison values not found in epic or Care Everywhere ). Hg down to 6.7 overnight.  BUN 13 -> 16. FOBT positive without overt GI bleeding.  1 unit PRBCs ordered, not yet transfused. -Check H&H posttransfusion and every 6 hours for the next 24 hours -Transfuse for hemoglobin less than 7 -NPO for now -Tentatively schedule EGD and colonoscopy for Monday 05/02/2021, await further recommendations per Dr. Armbruster -Pantoprazole 40 mg IV every 24 hours  2.  Mildly elevated AST levels. No alcohol use.  Fatty liver per sonogram 07/2011. -Abdominal sonogram rule out liver disease/cirrhosis -Repeat hepatic panel in a.m.  3. Elevated total bilirubin level, indirect bili consistent with hemolysis.  4.  Coagulopathy, unclear etiology query fatty liver disease/cirrhosis.  INR 1.7.  Not on anticoagulation.  5.  Thrombocytopenia, query fatty liver disease/cirrhosis    Lam Bjorklund M Kennedy-Smith  05/01/2021, 8:28AM      

## 2021-05-01 NOTE — ED Notes (Signed)
Patient is not in pain at this time. Patient is ready for transport.

## 2021-05-01 NOTE — H&P (View-Only) (Signed)
Referring Provider: Dr. Wendee Beavers Primary Care Physician:  Maury Dus, MD with [facility]  Primary Gastroenterologist:  Dr. Deatra Ina in 2004  Reason for Consultation: Anemia  HPI: Ann Mcconnell is a 75 y.o. female with a past medical history of hypertension, hyperlipidemia and gout. Past cholecystectomy, breast reduction surgery and left arm surgery.   She developed fatigue approximately 6 months ago which has progressively worsened.  She also noted periods of her heart racing if she stood up for too long for the past few months.  She had a near syncopal episode at home on Friday 7/22 when she became profoundly weak and fell on the floor and laid there for 1-1/2 hours until she had enough strength to stand back up.  She did not lose consciousness.  She was seen by her PCP a few days ago and laboratory studies showed anemia and she was sent directly to [facility] ED 04/30/2021 for further evaluation.  Labs in the ED showed a hemoglobin level of 8.2 (baseline Hg unknown, labs from PCP not in Epic or Care Everywhere). Hematocrit 27.2.  MCV 82.4.  Platelet 165.  WBC 8.1.  BUN 13.  Creatinine 0.87.  Alk phos 89.  Albumin 2.8.  AST 69.  ALT 31.  Total bili 3.5.  SARS coronavirus 2 negative.  FOBT positive.  Her hemoglobin level dropped to 6.7 at 4:30 AM today.  Total bili 3.1.  Direct bili 1.1.  Indirect bili 2.0 indicating hemolysis.  INR 1.7.  PTT 42.  AST down to 59.  Currently, she complains of persistent fatigue.  No chest pain or palpitations at this time.  No shortness of breath.  She has dyspnea with exertion for the past few months.  No cough or hemoptysis.  No dysphagia or heartburn.  No upper or lower abdominal pain.  No nausea or vomiting.  She is passing a normal formed brown bowel movement most days.  No rectal bleeding or black stools.  She stopped taking aspirin 6 months ago because she is thought her blood was too thin.  No NSAID use.  She underwent a colonoscopy  by Dr. Sharlett Iles 09/30/2003 which was normal.  She stated undergoing an EGD in 2004 as well, ever, these records are not in epic or Care Everywhere he cannot recall why this procedure was done or what the results showed.  No weight loss.  No fever, sweats or chills.  She is a non-smoker.  No alcohol use.  No family history of esophageal or gastric cancer.  Her son has familial polyposis with at least one cancerous polyp which she reported was paternally driven.  She lives by herself and her son lives in [place].   Past Medical History:  Diagnosis Date   DOE (dyspnea on exertion)    Gout    HLD (hyperlipidemia)    HTN (hypertension)    Hypothyroidism    Tachycardia     Past Surgical History:  Procedure Laterality Date   Arm Surgery     BREAST REDUCTION SURGERY     CHOLECYSTECTOMY     REDUCTION MAMMAPLASTY      Prior to Admission medications   Medication Sig Start Date End Date Taking? Authorizing Provider  allopurinol (ZYLOPRIM) 300 MG tablet Take 300 mg by mouth daily.    [provider]  amLODipine (NORVASC) 5 MG tablet Take 5 mg by mouth daily.    [provider]  aspirin 81 MG tablet Take 81 mg by mouth  daily.    [provider]  busPIRone (BUSPAR) 10 MG tablet Take 10 mg by mouth daily.    [provider]  indomethacin (INDOCIN) 50 MG capsule Take 50 mg by mouth 3 (three) times daily with meals.    [provider]  levothyroxine (SYNTHROID, LEVOTHROID) 50 MCG tablet Take 50 mcg by mouth daily.    [provider]  lisinopril (PRINIVIL,ZESTRIL) 20 MG tablet TAKE ONE TABLET BY MOUTH TWICE DAILY 01/06/12   Nahser, Wonda Cheng, MD  metoprolol succinate (TOPROL-XL) 100 MG 24 hr tablet Take 100 mg by mouth daily. Take with or immediately following a meal.    [provider]  pravastatin (PRAVACHOL) 40 MG tablet Take 40 mg by mouth daily.    [provider]    Current Facility-Administered Medications   Medication Dose Route Frequency Provider Last Rate Last Admin   acetaminophen (TYLENOL) tablet 650 mg  650 mg Oral Q6H PRN Tacey Ruiz, MD       Or   acetaminophen (TYLENOL) suppository 650 mg  650 mg Rectal Q6H PRN Tacey Ruiz, MD       allopurinol (ZYLOPRIM) tablet 300 mg  300 mg Oral Daily Tacey Ruiz, MD       amLODipine (NORVASC) tablet 5 mg  5 mg Oral Daily Tacey Ruiz, MD       bisacodyl (DULCOLAX) EC tablet 5 mg  5 mg Oral Daily PRN Tacey Ruiz, MD       busPIRone (BUSPAR) tablet 10 mg  10 mg Oral Daily Tacey Ruiz, MD       HYDROcodone-acetaminophen (NORCO/VICODIN) 5-325 MG per tablet 1-2 tablet  1-2 tablet Oral Q4H PRN Tacey Ruiz, MD       levothyroxine (SYNTHROID) tablet 50 mcg  50 mcg Oral Daily Tacey Ruiz, MD   50 mcg at 05/01/21 0511   lisinopril (ZESTRIL) tablet 20 mg  20 mg Oral BID Tacey Ruiz, MD   20 mg at 05/01/21 0427   metoprolol succinate (TOPROL-XL) 24 hr tablet 25 mg  25 mg Oral Daily Tacey Ruiz, MD       morphine 2 MG/ML injection 2 mg  2 mg Intravenous Q2H PRN Tacey Ruiz, MD       ondansetron (ZOFRAN) tablet 4 mg  4 mg Oral Q6H PRN Tacey Ruiz, MD       Or   ondansetron (ZOFRAN) injection 4 mg  4 mg Intravenous Q6H PRN Tacey Ruiz, MD       pantoprazole (PROTONIX) injection 40 mg  40 mg Intravenous BID Tacey Ruiz, MD   40 mg at 05/01/21 0059   pravastatin (PRAVACHOL) tablet 40 mg  40 mg Oral Daily Tacey Ruiz, MD       senna-docusate (Senokot-S) tablet 1 tablet  1 tablet Oral QHS PRN Tacey Ruiz, MD       Current Outpatient Medications  Medication Sig Dispense Refill   allopurinol (ZYLOPRIM) 300 MG tablet Take 300 mg by mouth daily.     amLODipine (NORVASC) 5 MG tablet Take 5 mg by mouth daily.     aspirin 81 MG tablet Take 81 mg by mouth daily.     busPIRone (BUSPAR) 10 MG tablet Take 10 mg by mouth daily.     indomethacin (INDOCIN) 50 MG capsule Take 50 mg by mouth 3 (three) times daily with  meals.     levothyroxine (SYNTHROID, LEVOTHROID) 50 MCG tablet Take 50 mcg by mouth daily.     lisinopril (PRINIVIL,ZESTRIL) 20 MG tablet TAKE ONE  TABLET BY MOUTH TWICE DAILY 60 tablet 0   metoprolol succinate (TOPROL-XL) 100 MG 24 hr tablet Take 100 mg by mouth daily. Take with or immediately following a meal.     pravastatin (PRAVACHOL) 40 MG tablet Take 40 mg by mouth daily.      Allergies as of 04/30/2021   (No Known Allergies)    Family History  Problem Relation Age of Onset   Heart attack Other    Lymphoma Other    COPD Other    Thyroid disease Other    Sleep apnea Other     Social History   Socioeconomic History   Marital status: Married    Spouse name: Not on file   Number of children: 1   Years of education: Not on file   Highest education level: Not on file  Occupational History   Occupation: retired  Tobacco Use   Smoking status: Never   Smokeless tobacco: Never  Substance and Sexual Activity   Alcohol use: No   Drug use: Never   Sexual activity: Not on file  Other Topics Concern   Not on file  Social History Narrative   Not on file   Social Determinants of Health   Financial Resource Strain: Not on file  Food Insecurity: Not on file  Transportation Needs: Not on file  Physical Activity: Not on file  Stress: Not on file  Social Connections: Not on file  Intimate Partner Violence: Not on file    Review of Systems: Gen: Denies fever, sweats or chills. No weight loss.  CV: See HPI. Resp: Denies cough, shortness of breath of hemoptysis.  GI: See HPI. GU : Denies urinary burning, blood in urine, heat urinary frequency or incontinence. MS: Denies joint pain, muscles aches or weakness. Derm: + Itchiness LLE.  Psych: Denies depression, anxiety or memory loss. Heme: Denies easy bruising, bleeding. Neuro:  Denies headaches, dizziness or paresthesias. Endo:  Denies any problems with DM, thyroid or adrenal function.  Physical Exam: Vital signs in last  24 hours: Temp:  [98.1 F (36.7 C)-98.7 F (37.1 C)] 98.1 F (36.7 C) (07/24 0154) Pulse Rate:  [60-70] 70 (07/24 0600) Resp:  [15-20] 15 (07/24 0600) BP: (107-154)/(51-113) 120/56 (07/24 0600) SpO2:  [96 %-100 %] 96 % (07/24 0600) Weight:  [99.8 kg] 99.8 kg (07/23 1318)   General: 75 year old female fatigued appearing in no acute distress. Head:  Normocephalic and atraumatic. Eyes:  No scleral icterus. Conjunctiva pink. Ears:  Normal auditory acuity. Nose:  No deformity, discharge or lesions. Mouth:  Dentition intact. No ulcers or lesions.  Neck:  Supple. No lymphadenopathy or thyromegaly.  Lungs: Breath sounds clear throughout. Heart: Regular rate and rhythm, no murmurs. Abdomen: Soft, nondistended.  Fullness to the right mid abdomen without discrete palpable mass.  Nontender.  Positive bowel sounds all 4 quadrants.  No HSM. Rectal: Deferred.  FOBT positive. Musculoskeletal:  Symmetrical without gross deformities.  Pulses:  Normal pulses noted. Extremities: Right lower extremity with trace edema.  Left lower extremity with 1+ edema to tibial area with erythema and mild excoriations negative Homans' sign. Neurologic:  Alert and  oriented x4. No focal deficits.  Skin:  Intact without significant lesions or rashes. Psych:  Alert and cooperative. Normal mood and affect.  Intake/Output from previous day: No intake/output data recorded. Intake/Output this shift: No intake/output data recorded.  Lab Results: Recent Labs    04/30/21 1411 05/01/21 0430  WBC 8.1 6.1  HGB 8.2* 6.7*  HCT 27.2* 22.1*  PLT 165 113*   BMET Recent Labs    04/30/21 1411 05/01/21 0430  NA 138 137  K 3.9 3.4*  CL 108 107  CO2 21* 20*  GLUCOSE 121* 93  BUN 13 16  CREATININE 0.87 0.98  CALCIUM 9.2 8.7*   LFT Recent Labs    05/01/21 0430  PROT 5.6*  ALBUMIN 2.3*  AST 59*  ALT 26  ALKPHOS 74  BILITOT 3.1*  BILIDIR 1.1*   PT/INR Recent Labs    05/01/21 0430  LABPROT 20.1*  INR  1.7*   Hepatitis Panel No results for input(s): HEPBSAG, HCVAB, HEPAIGM, HEPBIGM in the last 72 hours.    Studies/Results: No results found.  IMPRESSION/PLAN:  64.  75 year old with fatigue which has progressively worsened over the past 6 months with generalized weakness, 1 episode of near syncope and heart racing when standing for too long for a few months. Laboratory studies by PCP showed normocytic anemia. Admission hemoglobin 8.2 (baseline hemoglobin level unknown, comparison values not found in epic or Care Everywhere ). Hg down to 6.7 overnight.  BUN 13 -> 16. FOBT positive without overt GI bleeding.  1 unit PRBCs ordered, not yet transfused. -Check H&H posttransfusion and every 6 hours for the next 24 hours -Transfuse for hemoglobin less than 7 -NPO for now -Tentatively schedule EGD and colonoscopy for Monday 05/02/2021, await further recommendations per Dr. Havery Moros -Pantoprazole 40 mg IV every 24 hours  2.  Mildly elevated AST levels. No alcohol use.  Fatty liver per sonogram 07/2011. -Abdominal sonogram rule out liver disease/cirrhosis -Repeat hepatic panel in a.m.  3. Elevated total bilirubin level, indirect bili consistent with hemolysis.  4.  Coagulopathy, unclear etiology query fatty liver disease/cirrhosis.  INR 1.7.  Not on anticoagulation.  5.  Thrombocytopenia, query fatty liver disease/cirrhosis    Noralyn Pick  05/01/2021, 8:28AM

## 2021-05-01 NOTE — ED Notes (Addendum)
Critical Result Time: 0503 Test: HGB Value: 6.7  MD notified: Sedonia Small, MD Action: Awaiting orders

## 2021-05-01 NOTE — ED Notes (Signed)
Consent signed.

## 2021-05-02 ENCOUNTER — Observation Stay (HOSPITAL_COMMUNITY): Payer: Medicare Other | Admitting: Anesthesiology

## 2021-05-02 ENCOUNTER — Encounter (HOSPITAL_COMMUNITY): Payer: Self-pay | Admitting: Internal Medicine

## 2021-05-02 ENCOUNTER — Encounter (HOSPITAL_COMMUNITY)
Admission: EM | Disposition: A | Discharge status: Home / Self Care | Payer: Self-pay | Source: Ambulatory Visit | Attending: Student

## 2021-05-02 DIAGNOSIS — Z7982 Long term (current) use of aspirin: Secondary | ICD-10-CM | POA: Diagnosis not present

## 2021-05-02 DIAGNOSIS — K922 Gastrointestinal hemorrhage, unspecified: Secondary | ICD-10-CM

## 2021-05-02 DIAGNOSIS — D509 Iron deficiency anemia, unspecified: Secondary | ICD-10-CM | POA: Diagnosis present

## 2021-05-02 DIAGNOSIS — I10 Essential (primary) hypertension: Secondary | ICD-10-CM | POA: Diagnosis present

## 2021-05-02 DIAGNOSIS — M109 Gout, unspecified: Secondary | ICD-10-CM | POA: Diagnosis present

## 2021-05-02 DIAGNOSIS — R17 Unspecified jaundice: Secondary | ICD-10-CM | POA: Diagnosis not present

## 2021-05-02 DIAGNOSIS — K760 Fatty (change of) liver, not elsewhere classified: Secondary | ICD-10-CM | POA: Diagnosis present

## 2021-05-02 DIAGNOSIS — E785 Hyperlipidemia, unspecified: Secondary | ICD-10-CM | POA: Diagnosis present

## 2021-05-02 DIAGNOSIS — E876 Hypokalemia: Secondary | ICD-10-CM | POA: Diagnosis present

## 2021-05-02 DIAGNOSIS — Z791 Long term (current) use of non-steroidal anti-inflammatories (NSAID): Secondary | ICD-10-CM | POA: Diagnosis not present

## 2021-05-02 DIAGNOSIS — K31811 Angiodysplasia of stomach and duodenum with bleeding: Secondary | ICD-10-CM | POA: Diagnosis present

## 2021-05-02 DIAGNOSIS — E669 Obesity, unspecified: Secondary | ICD-10-CM | POA: Diagnosis present

## 2021-05-02 DIAGNOSIS — D696 Thrombocytopenia, unspecified: Secondary | ICD-10-CM | POA: Diagnosis present

## 2021-05-02 DIAGNOSIS — R195 Other fecal abnormalities: Secondary | ICD-10-CM | POA: Diagnosis not present

## 2021-05-02 DIAGNOSIS — B159 Hepatitis A without hepatic coma: Secondary | ICD-10-CM | POA: Diagnosis present

## 2021-05-02 DIAGNOSIS — Z9049 Acquired absence of other specified parts of digestive tract: Secondary | ICD-10-CM | POA: Diagnosis not present

## 2021-05-02 DIAGNOSIS — K31819 Angiodysplasia of stomach and duodenum without bleeding: Secondary | ICD-10-CM

## 2021-05-02 DIAGNOSIS — Z6834 Body mass index (BMI) 34.0-34.9, adult: Secondary | ICD-10-CM | POA: Diagnosis not present

## 2021-05-02 DIAGNOSIS — K644 Residual hemorrhoidal skin tags: Secondary | ICD-10-CM | POA: Diagnosis present

## 2021-05-02 DIAGNOSIS — W1830XA Fall on same level, unspecified, initial encounter: Secondary | ICD-10-CM | POA: Diagnosis present

## 2021-05-02 DIAGNOSIS — E039 Hypothyroidism, unspecified: Secondary | ICD-10-CM | POA: Diagnosis present

## 2021-05-02 DIAGNOSIS — Z20822 Contact with and (suspected) exposure to covid-19: Secondary | ICD-10-CM | POA: Diagnosis present

## 2021-05-02 DIAGNOSIS — Z79890 Hormone replacement therapy (postmenopausal): Secondary | ICD-10-CM | POA: Diagnosis not present

## 2021-05-02 DIAGNOSIS — D649 Anemia, unspecified: Secondary | ICD-10-CM | POA: Diagnosis present

## 2021-05-02 DIAGNOSIS — D689 Coagulation defect, unspecified: Secondary | ICD-10-CM | POA: Diagnosis present

## 2021-05-02 DIAGNOSIS — K648 Other hemorrhoids: Secondary | ICD-10-CM | POA: Diagnosis present

## 2021-05-02 DIAGNOSIS — D62 Acute posthemorrhagic anemia: Secondary | ICD-10-CM | POA: Diagnosis present

## 2021-05-02 DIAGNOSIS — Z79899 Other long term (current) drug therapy: Secondary | ICD-10-CM | POA: Diagnosis not present

## 2021-05-02 DIAGNOSIS — K7460 Unspecified cirrhosis of liver: Secondary | ICD-10-CM | POA: Diagnosis present

## 2021-05-02 HISTORY — PX: HOT HEMOSTASIS: SHX5433

## 2021-05-02 HISTORY — PX: ESOPHAGOGASTRODUODENOSCOPY (EGD) WITH PROPOFOL: SHX5813

## 2021-05-02 HISTORY — PX: COLONOSCOPY WITH PROPOFOL: SHX5780

## 2021-05-02 HISTORY — DX: Gastrointestinal hemorrhage, unspecified: K92.2

## 2021-05-02 LAB — MAGNESIUM: Magnesium: 2 mg/dL (ref 1.7–2.4)

## 2021-05-02 LAB — COMPREHENSIVE METABOLIC PANEL
ALT: 24 U/L (ref 0–44)
AST: 51 U/L — ABNORMAL HIGH (ref 15–41)
Albumin: 2.6 g/dL — ABNORMAL LOW (ref 3.5–5.0)
Alkaline Phosphatase: 82 U/L (ref 38–126)
Anion gap: 9 (ref 5–15)
BUN: 15 mg/dL (ref 8–23)
CO2: 19 mmol/L — ABNORMAL LOW (ref 22–32)
Calcium: 8.9 mg/dL (ref 8.9–10.3)
Chloride: 115 mmol/L — ABNORMAL HIGH (ref 98–111)
Creatinine, Ser: 0.94 mg/dL (ref 0.44–1.00)
GFR, Estimated: >60 mL/min (ref >60)
Glucose, Bld: 82 mg/dL (ref 70–99)
Potassium: 4 mmol/L (ref 3.5–5.1)
Sodium: 143 mmol/L (ref 135–145)
Total Bilirubin: 3.7 mg/dL — ABNORMAL HIGH (ref 0.3–1.2)
Total Protein: 6.1 g/dL — ABNORMAL LOW (ref 6.5–8.1)

## 2021-05-02 LAB — PROTIME-INR
INR: 1.7 — ABNORMAL HIGH (ref 0.8–1.2)
Prothrombin Time: 19.9 seconds — ABNORMAL HIGH (ref 11.4–15.2)

## 2021-05-02 LAB — HEMOGLOBIN AND HEMATOCRIT, BLOOD
HCT: 26 % — ABNORMAL LOW (ref 36.0–46.0)
HCT: 26.5 % — ABNORMAL LOW (ref 36.0–46.0)
Hemoglobin: 7.6 g/dL — ABNORMAL LOW (ref 12.0–15.0)
Hemoglobin: 7.9 g/dL — ABNORMAL LOW (ref 12.0–15.0)

## 2021-05-02 LAB — HEPATITIS C ANTIBODY: HCV Ab: NONREACTIVE

## 2021-05-02 LAB — CBC
HCT: 25.6 % — ABNORMAL LOW (ref 36.0–46.0)
Hemoglobin: 7.6 g/dL — ABNORMAL LOW (ref 12.0–15.0)
MCH: 24.7 pg — ABNORMAL LOW (ref 26.0–34.0)
MCHC: 29.7 g/dL — ABNORMAL LOW (ref 30.0–36.0)
MCV: 83.1 fL (ref 80.0–100.0)
Platelets: 111 10*3/uL — ABNORMAL LOW (ref 150–400)
RBC: 3.08 MIL/uL — ABNORMAL LOW (ref 3.87–5.11)
RDW: 20.2 % — ABNORMAL HIGH (ref 11.5–15.5)
WBC: 5.7 10*3/uL (ref 4.0–10.5)
nRBC: 0 % (ref 0.0–0.2)

## 2021-05-02 LAB — HEPATITIS B SURFACE ANTIGEN: Hepatitis B Surface Ag: NONREACTIVE

## 2021-05-02 LAB — TSH: TSH: 0.578 u[IU]/mL (ref 0.350–4.500)

## 2021-05-02 LAB — SAVE SMEAR(SSMR), FOR PROVIDER SLIDE REVIEW

## 2021-05-02 LAB — HEPATITIS B CORE ANTIBODY, TOTAL: Hep B Core Total Ab: NONREACTIVE

## 2021-05-02 LAB — HAPTOGLOBIN: Haptoglobin: 63 mg/dL (ref 42–346)

## 2021-05-02 LAB — HEPATITIS A ANTIBODY, TOTAL: hep A Total Ab: REACTIVE — AB

## 2021-05-02 LAB — PATHOLOGIST SMEAR REVIEW

## 2021-05-02 SURGERY — ESOPHAGOGASTRODUODENOSCOPY (EGD) WITH PROPOFOL
Anesthesia: Monitor Anesthesia Care

## 2021-05-02 MED ORDER — PHENYLEPHRINE 40 MCG/ML (10ML) SYRINGE FOR IV PUSH (FOR BLOOD PRESSURE SUPPORT)
PREFILLED_SYRINGE | INTRAVENOUS | Status: DC | PRN
  Administered 2021-05-02: 160 ug via INTRAVENOUS
  Administered 2021-05-02 (×3): 120 ug via INTRAVENOUS

## 2021-05-02 MED ORDER — LACTATED RINGERS IV SOLN: INTRAVENOUS | Status: DC | PRN

## 2021-05-02 MED ORDER — LIDOCAINE 2% (20 MG/ML) 5 ML SYRINGE
INTRAMUSCULAR | Status: DC | PRN
  Administered 2021-05-02: 100 mg via INTRAVENOUS

## 2021-05-02 MED ORDER — PROPOFOL 10 MG/ML IV BOLUS
INTRAVENOUS | Status: DC | PRN
  Administered 2021-05-02: 50 mg via INTRAVENOUS

## 2021-05-02 MED ORDER — EPHEDRINE SULFATE-NACL 50-0.9 MG/10ML-% IV SOSY
PREFILLED_SYRINGE | INTRAVENOUS | Status: DC | PRN
  Administered 2021-05-02 (×3): 5 mg via INTRAVENOUS

## 2021-05-02 MED ORDER — PROPOFOL 500 MG/50ML IV EMUL
INTRAVENOUS | Status: DC | PRN
  Administered 2021-05-02: 150 ug/kg/min via INTRAVENOUS

## 2021-05-02 SURGICAL SUPPLY — 25 items

## 2021-05-02 NOTE — Anesthesia Postprocedure Evaluation (Signed)
Anesthesia Post Note  Patient: Ann Mcconnell  Procedure(s) Performed: ESOPHAGOGASTRODUODENOSCOPY (EGD) WITH PROPOFOL COLONOSCOPY WITH PROPOFOL HOT HEMOSTASIS (ARGON PLASMA COAGULATION/BICAP)     Patient location during evaluation: PACU Anesthesia Type: MAC Level of consciousness: awake and alert Pain management: pain level controlled Vital Signs Assessment: post-procedure vital signs reviewed and stable Respiratory status: spontaneous breathing, nonlabored ventilation, respiratory function stable and patient connected to nasal cannula oxygen Cardiovascular status: stable and blood pressure returned to baseline Postop Assessment: no apparent nausea or vomiting Anesthetic complications: no   No notable events documented.  Last Vitals:  Vitals:   05/02/21 1331 05/02/21 1341  BP: (!) 142/42 (!) 140/57  Pulse: 74 73  Resp: 15 17  Temp:    SpO2: 99% 97%    Last Pain:  Vitals:   05/02/21 1341  TempSrc:   PainSc: 0-No pain                 Tiajuana Amass

## 2021-05-02 NOTE — Plan of Care (Signed)
Plan of care initiated and reviewed with the patient. 

## 2021-05-02 NOTE — Op Note (Signed)
[facility] Patient Name: Ann Mcconnell Procedure Date: 05/02/2021 MRN: [id number] Attending MD: Mauri Pole , MD Date of Birth: [birth year] CSN: [id number] Age: 75 Admit Type: Inpatient Procedure:                Colonoscopy Indications:              Evaluation of unexplained GI bleeding presenting                            with fecal occult blood Providers:                Mauri Pole, MD, Kary Kos RN, RN, Cherylynn Ridges, TechnicianTodd Gerilyn Nestle CRNA, Tyna Jaksch                            Technician Referring MD:              Medicines:                Monitored Anesthesia Care Complications:            No immediate complications. Estimated Blood Loss:     Estimated blood loss was minimal. Procedure:                Pre-Anesthesia Assessment:                           - Prior to the procedure, a History and Physical                            was performed, and patient medications and                            allergies were reviewed. The patient's tolerance of                            previous anesthesia was also reviewed. The risks                            and benefits of the procedure and the sedation                            options and risks were discussed with the patient.                            All questions were answered, and informed consent                            was obtained. Prior Anticoagulants: The patient has                            taken no previous anticoagulant or antiplatelet                            agents. ASA  Grade Assessment: III - A patient with                            severe systemic disease. After reviewing the risks                            and benefits, the patient was deemed in                            satisfactory condition to undergo the procedure.                           After obtaining informed consent, the colonoscope                            was passed  under direct vision. Throughout the                            procedure, the patient's blood pressure, pulse, and                            oxygen saturations were monitored continuously. The                            [id number] Olympus pediatric colonscope                            was introduced through the anus and advanced to the                            the cecum, identified by appendiceal orifice and                            ileocecal valve. The colonoscopy was performed                            without difficulty. The patient tolerated the                            procedure well. The quality of the bowel                            preparation was adequate. The ileocecal valve,                            appendiceal orifice, and rectum were photographed. Scope In: 12:54:02 PM Scope Out: 1:11:14 PM Total Procedure Duration: 0 hours 17 minutes 12 seconds  Findings:      The perianal and digital rectal examinations were normal.      Non-bleeding external and internal hemorrhoids were found during       retroflexion. The hemorrhoids were medium-sized.      The exam was otherwise without abnormality. Impression:               - Non-bleeding external and internal hemorrhoids.                           -  The examination was otherwise normal.                           - No specimens collected. Moderate Sedation:      N/A Recommendation:           - Patient has a contact number available for                            emergencies. The signs and symptoms of potential                            delayed complications were discussed with the                            patient. Return to normal activities tomorrow.                            Written discharge instructions were provided to the                            patient.                           - Resume previous diet.                           - Continue present medications.                           - No repeat  colonoscopy due to age. Procedure Code(s):        --- Professional ---                           [phone], Colonoscopy, flexible; diagnostic, including                            collection of specimen(s) by brushing or washing,                            when performed (separate procedure) Diagnosis Code(s):        --- Professional ---                           K64.8, Other hemorrhoids                           R19.5, Other fecal abnormalities CPT copyright 2019 [employer]. All rights reserved. The codes documented in this report are preliminary and upon coder review may  be revised to meet current compliance requirements. Mauri Pole, MD 05/02/2021 1:32:59 PM This report has been signed electronically. Number of Addenda: 0

## 2021-05-02 NOTE — Op Note (Signed)
[facility] Patient Name: Ann Mcconnell Procedure Date: 05/02/2021 MRN: [id number] Attending MD: Mauri Pole , MD Date of Birth: 1946/09/30 CSN: [id number] Age: 75 Admit Type: Inpatient Procedure:                Upper GI endoscopy Indications:              Suspected upper gastrointestinal bleeding in                            patient with unexplained iron deficiency anemia Providers:                Mauri Pole, MD, Kary Kos RN, RN, Cherylynn Ridges, Technician, Tyna Jaksch Darlina Rumpf CRNA Referring MD:              Medicines:                Monitored Anesthesia Care Complications:            No immediate complications. Estimated Blood Loss:     Estimated blood loss was minimal. Procedure:                Pre-Anesthesia Assessment:                           - Prior to the procedure, a History and Physical                            was performed, and patient medications and                            allergies were reviewed. The patient's tolerance of                            previous anesthesia was also reviewed. The risks                            and benefits of the procedure and the sedation                            options and risks were discussed with the patient.                            All questions were answered, and informed consent                            was obtained. Prior Anticoagulants: The patient has                            taken no previous anticoagulant or antiplatelet  agents. ASA Grade Assessment: III - A patient with                            severe systemic disease. After reviewing the risks                            and benefits, the patient was deemed in                            satisfactory condition to undergo the procedure.                           After obtaining informed consent, the endoscope was                             passed under direct vision. Throughout the                            procedure, the patient's blood pressure, pulse, and                            oxygen saturations were monitored continuously. The                            [id number] Olympus gastroscope was                            introduced through the mouth, and advanced to the                            second part of duodenum. The upper GI endoscopy was                            accomplished without difficulty. The patient                            tolerated the procedure well. Findings:      The Z-line was regular and was found 36 cm from the incisors.      No gross lesions were noted in the entire esophagus. 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 Coagulation for hemostasis       using argon plasma was successful.      No gastric varices      The examined duodenum was normal. Impression:               - Z-line regular, 36 cm from the incisors.                           - No gross lesions in esophagus.                           - Gastric antral vascular ectasia. Treated  with                            argon plasma coagulation (APC). Likely etiology for                            GI blood loss and anemia                           - Normal examined duodenum.                           - No specimens collected. Moderate Sedation:      Not Applicable - Patient had care per Anesthesia. Recommendation:           - Patient has a contact number available for                            emergencies. The signs and symptoms of potential                            delayed complications were discussed with the                            patient. Return to normal activities tomorrow.                            Written discharge instructions were provided to the                            patient.                            - Resume previous diet.                           - Continue present medications.                           - See the other procedure note for documentation of                            additional recommendations. Procedure Code(s):        --- Professional ---                           [phone], Esophagogastroduodenoscopy, flexible,                            transoral; with control of bleeding, any method Diagnosis Code(s):        --- Professional ---                           [id number], Angiodysplasia of stomach and duodenum                            without bleeding  D50.9, Iron deficiency anemia, unspecified CPT copyright 2019 American Medical Association. All rights reserved. The codes documented in this report are preliminary and upon coder review may  be revised to meet current compliance requirements. Mauri Pole, MD 05/02/2021 1:28:39 PM This report has been signed electronically. Number of Addenda: 0

## 2021-05-02 NOTE — Anesthesia Preprocedure Evaluation (Signed)
Anesthesia Evaluation  Patient identified by MRN, date of birth, ID band Patient awake    Reviewed: Allergy & Precautions, NPO status , Patient's Chart, lab work & pertinent test results  Airway Mallampati: II  TM Distance: >3 FB     Dental  (+) Dental Advisory Given   Pulmonary neg pulmonary ROS,    breath sounds clear to auscultation       Cardiovascular hypertension,  Rhythm:Regular Rate:Normal     Neuro/Psych negative neurological ROS     GI/Hepatic Neg liver ROS, Gi bleed    Endo/Other  Hypothyroidism   Renal/GU negative Renal ROS     Musculoskeletal   Abdominal   Peds  Hematology  (+) anemia ,   Anesthesia Other Findings   Reproductive/Obstetrics                             Anesthesia Physical Anesthesia Plan  ASA: 3  Anesthesia Plan: MAC   Post-op Pain Management:    Induction:   PONV Risk Score and Plan: 2 and Propofol infusion, Ondansetron and Treatment may vary due to age or medical condition  Airway Management Planned: Natural Airway and Nasal Cannula  Additional Equipment:   Intra-op Plan:   Post-operative Plan:   Informed Consent: I have reviewed the patients History and Physical, chart, labs and discussed the procedure including the risks, benefits and alternatives for the proposed anesthesia with the patient or authorized representative who has indicated his/her understanding and acceptance.       Plan Discussed with: CRNA  Anesthesia Plan Comments:         Anesthesia Quick Evaluation

## 2021-05-02 NOTE — Transfer of Care (Signed)
Immediate Anesthesia Transfer of Care Note  Patient: Ann Mcconnell  Procedure(s) Performed: ESOPHAGOGASTRODUODENOSCOPY (EGD) WITH PROPOFOL COLONOSCOPY WITH PROPOFOL HOT HEMOSTASIS (ARGON PLASMA COAGULATION/BICAP)  Patient Location: PACU and Endoscopy Unit  Anesthesia Type:MAC  Level of Consciousness: awake, alert  and oriented  Airway & Oxygen Therapy: Patient Spontanous Breathing and Patient connected to face mask  Post-op Assessment: Report given to RN and Post -op Vital signs reviewed and stable  Post vital signs: Reviewed and stable  Last Vitals:  Vitals Value Taken Time  BP 149/40 05/02/21 1318  Temp    Pulse 79 05/02/21 1319  Resp 19 05/02/21 1319  SpO2 100 % 05/02/21 1319  Vitals shown include unvalidated device data.  Last Pain:  Vitals:   05/02/21 1133  TempSrc: Oral  PainSc: 0-No pain         Complications: No notable events documented.

## 2021-05-02 NOTE — Progress Notes (Signed)
PROGRESS NOTE  Ann Mcconnell [id number] DOB: [birth year]   PCP: Maury Dus, MD  Patient is from: Home.  Lives with husband.  Independent at baseline.  DOA: 04/30/2021 LOS: 0  Chief complaints:  Chief Complaint  Patient presents with   abnormal labs     Brief Narrative / Interim history: 75 year old F with PMH of hypothyroidism, HTN and fatty liver disease directed to ED by PCP for low hemoglobin.  She was seen by PCP for progressive dyspnea on exertion, lightheadedness and fatigue, and had a CBC that was significant for low hemoglobin, and she was directed to ED.  Patient denies melena, hematochezia or easy bruising.  Not on blood thinner or NSAIDs.  She denies GERD or heartburn.   In ED, Hgb 8.2 but dropped further to 6.7.  Hemoccult positive.  Total bili 3.5 with indirect predominance.  Transfused 1 unit with appropriate response.  GI consulted. 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  Treated with APC. Colo with nonbleeding internal and external hemorrhoids.  Subjective: Seen and examined earlier this morning before she went down for endoscopy.  No major events overnight of this morning.  No overt GI bleed during bowel prep.  Feels better this morning.  She states she was not as weak.  Denies chest pain or shortness of breath.  Objective: Vitals:   05/02/21 1319 05/02/21 1331 05/02/21 1341 05/02/21 1407  BP: (!) 149/90 (!) 142/42 (!) 140/57 135/63  Pulse: 73 74 73 74  Resp: '15 15 17 16  '$ Temp: 97.9 F (36.6 C)   (!) 97.5 F (36.4 C)  TempSrc: Axillary   Oral  SpO2: 100% 99% 97% 100%  Weight:      Height:        Intake/Output Summary (Last 24 hours) at 05/02/2021 1556 Last data filed at 05/02/2021 1400 Gross per 24 hour  Intake 1276.35 ml  Output 1700 ml  Net -423.65 ml   Filed Weights   04/30/21 1318 05/02/21 1133  Weight: 99.8 kg 99.8 kg    Examination:  GENERAL: No apparent distress.  Nontoxic. HEENT: MMM.  Vision and hearing grossly intact.   NECK: Supple.  No apparent JVD.  RESP: 98% on RA.  No IWOB.  Fair aeration bilaterally. CVS:  RRR. Heart sounds normal.  ABD/GI/GU: BS+. Abd soft, NTND.  MSK/EXT:  Moves extremities. No apparent deformity. No edema.  SKIN: no apparent skin lesion or wound NEURO: Awake and alert. Oriented appropriately.  No apparent focal neuro deficit. PSYCH: Calm. Normal affect.   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  Treated with APC.  7/25-Colo with nonbleeding internal and external hemorrhoids.  Microbiology summarized: COVID-19 and influenza PCR nonreactive.  Assessment & Plan: Acute blood loss anemia likely due to upper GIB-unknown baseline.  EGD and colonoscopy as above.  Possible cirrhosis on RUQ which could be contributing as well.  Has hyperbilirubinemia with indirect predominance suggesting hemolysis but normal LDH argues against this.  Anemia panel with some degree of iron deficiency.  Appropriate response after 1 unit.  H&H relatively stable. Recent Labs    04/30/21 1411 05/01/21 0430 05/01/21 1550 05/01/21 2022 05/02/21 0327 05/02/21 0747  HGB 8.2* 6.7* 7.9* 9.1* 7.6* 7.6*  -Follow haptoglobin. -Continue PPI -Soft diet -Recheck H&H in the morning   Mildly elevated AST/hyperbilirubinemia/coagulopathy-denies alcohol use.  Has history of fatty liver.  CK within normal.  RUQ ultrasound concerning for liver cirrhosis.  Acute hepatitis A IgM  positive. -Check CK -Recheck LFT in the morning  Hypothyroidism: TSH within normal. -Continue home Synthroid  Hypokalemia/hypomagnesemia: Resolved. -Replenish and recheck  Thrombocytopenia: Could be due to cirrhosis.  Stable. Recent Labs  Lab 04/30/21 1411 05/01/21 0430 05/02/21 0327  PLT 165 113* 111*  -Continue monitoring  Class I obesity Body mass index is 34.46 kg/m.         DVT prophylaxis:  SCDs Start: 05/01/21 0319 Place and maintain sequential compression device Start: 04/30/21 2301  Code  Status: Full code Family Communication: Patient and/or RN. Available if any question.  Level of care: Med-Surg Status is: Observation  The patient will require care spanning > 2 midnights and should be moved to inpatient because: Ongoing diagnostic testing needed not appropriate for outpatient work up and Inpatient level of care appropriate due to severity of illness  Dispo: The patient is from: Home              Anticipated d/c is to: Home likely on 7/26 if H&H stable              Patient currently is not medically stable to d/c.   Difficult to place patient No       Consultants:  Gastroenterology   Sch Meds:  Scheduled Meds:  allopurinol  300 mg Oral Daily   busPIRone  10 mg Oral Daily   levothyroxine  100 mcg Oral QAC breakfast   metoprolol succinate  25 mg Oral Daily   pantoprazole (PROTONIX) IV  40 mg Intravenous BID   pravastatin  40 mg Oral Daily   Continuous Infusions:   PRN Meds:.acetaminophen **OR** acetaminophen, bisacodyl, HYDROcodone-acetaminophen, morphine injection, ondansetron **OR** ondansetron (ZOFRAN) IV, senna-docusate  Antimicrobials: Anti-infectives (From admission, onward)    None        I have personally reviewed the following labs and images: CBC: Recent Labs  Lab 04/30/21 1411 05/01/21 0430 05/01/21 1550 05/01/21 2022 05/02/21 0327 05/02/21 0747  WBC 8.1 6.1  --   --  5.7  --   NEUTROABS 4.2  --   --   --   --   --   HGB 8.2* 6.7* 7.9* 9.1* 7.6* 7.6*  HCT 27.2* 22.1* 25.8* 30.6* 25.6* 26.0*  MCV 82.4 82.5  --   --  83.1  --   PLT 165 113*  --   --  111*  --    BMP &GFR Recent Labs  Lab 04/30/21 1411 05/01/21 0430 05/01/21 0720 05/02/21 0327  NA 138 137  --  143  K 3.9 3.4*  --  4.0  CL 108 107  --  115*  CO2 21* 20*  --  19*  GLUCOSE 121* 93  --  82  BUN 13 16  --  15  CREATININE 0.87 0.98  --  0.94  CALCIUM 9.2 8.7*  --  8.9  MG  --   --  1.7 2.0   Estimated Creatinine Clearance: 62.8 mL/min (by C-G formula based  on SCr of 0.94 mg/dL). Liver & Pancreas: Recent Labs  Lab 04/30/21 1411 05/01/21 0430 05/02/21 0327  AST 69* 59* 51*  ALT '31 26 24  '$ ALKPHOS 89 74 82  BILITOT 3.5* 3.1* 3.7*  PROT 6.8 5.6* 6.1*  ALBUMIN 2.8* 2.3* 2.6*   No results for input(s): LIPASE, AMYLASE in the last 168 hours. No results for input(s): AMMONIA in the last 168 hours. Diabetic: No results for input(s): HGBA1C in the last 72 hours. No results for input(s): GLUCAP in the  last 168 hours. Cardiac Enzymes: Recent Labs  Lab 05/01/21 0720  CKTOTAL 110   No results for input(s): PROBNP in the last 8760 hours. Coagulation Profile: Recent Labs  Lab 05/01/21 0430 05/02/21 0327  INR 1.7* 1.7*   Thyroid Function Tests: Recent Labs    05/02/21 0326  TSH 0.578   Lipid Profile: No results for input(s): CHOL, HDL, LDLCALC, TRIG, CHOLHDL, LDLDIRECT in the last 72 hours. Anemia Panel: Recent Labs    05/01/21 0721  VITAMINB12 1,334*  FOLATE 11.9  FERRITIN 15  TIBC 354  IRON 83  RETICCTPCT 1.9   Urine analysis: No results found for: COLORURINE, APPEARANCEUR, LABSPEC, PHURINE, GLUCOSEU, HGBUR, BILIRUBINUR, KETONESUR, PROTEINUR, UROBILINOGEN, NITRITE, LEUKOCYTESUR Sepsis Labs: Invalid input(s): PROCALCITONIN, [place]  Microbiology: Recent Results (from the past 240 hour(s))  Resp Panel by RT-PCR (Flu A&B, Covid) Nasopharyngeal Swab     Status: None   Collection Time: 04/30/21 10:47 PM   Specimen: Nasopharyngeal Swab; Nasopharyngeal(NP) swabs in vial transport medium  Result Value Ref Range Status   SARS Coronavirus 2 by RT PCR NEGATIVE NEGATIVE Final    Comment: (NOTE) SARS-CoV-2 target nucleic acids are NOT DETECTED.  The SARS-CoV-2 RNA is generally detectable in upper respiratory specimens during the acute phase of infection. The lowest concentration of SARS-CoV-2 viral copies this assay can detect is 138 copies/mL. A negative result does not preclude SARS-Cov-2 infection and should not be used  as the sole basis for treatment or other patient management decisions. A negative result may occur with  improper specimen collection/handling, submission of specimen other than nasopharyngeal swab, presence of viral mutation(s) within the areas targeted by this assay, and inadequate number of viral copies(<138 copies/mL). A negative result must be combined with clinical observations, patient history, and epidemiological information. The expected result is Negative.  Fact Sheet for Patients:  EntrepreneurPulse.com.au  Fact Sheet for Healthcare Providers:  IncredibleEmployment.be  This test is no t yet approved or cleared by the [place] FDA and  has been authorized for detection and/or diagnosis of SARS-CoV-2 by FDA under an Emergency Use Authorization (EUA). This EUA will remain  in effect (meaning this test can be used) for the duration of the COVID-19 declaration under Section 564(b)(1) of the Act, 21 U.S.C.section 360bbb-3(b)(1), unless the authorization is terminated  or revoked sooner.       Influenza A by PCR NEGATIVE NEGATIVE Final   Influenza B by PCR NEGATIVE NEGATIVE Final    Comment: (NOTE) The Xpert Xpress SARS-CoV-2/FLU/RSV plus assay is intended as an aid in the diagnosis of influenza from Nasopharyngeal swab specimens and should not be used as a sole basis for treatment. Nasal washings and aspirates are unacceptable for Xpert Xpress SARS-CoV-2/FLU/RSV testing.  Fact Sheet for Patients: EntrepreneurPulse.com.au  Fact Sheet for Healthcare Providers: IncredibleEmployment.be  This test is not yet approved or cleared by the [place] FDA and has been authorized for detection and/or diagnosis of SARS-CoV-2 by FDA under an Emergency Use Authorization (EUA). This EUA will remain in effect (meaning this test can be used) for the duration of the COVID-19 declaration under Section 564(b)(1)  of the Act, 21 U.S.C. section 360bbb-3(b)(1), unless the authorization is terminated or revoked.  Performed at [facility], [place] [address] [place], [place] [address]     Radiology Studies: No results found.     Scot Shiraishi T. McCook  If 7PM-7AM, please contact night-coverage [email or web address] 05/02/2021, 3:56 PM

## 2021-05-02 NOTE — Interval H&P Note (Signed)
History and Physical Interval Note:  05/02/2021 12:35 PM  Ann Mcconnell  has presented today for surgery, with the diagnosis of anemia, + FOBT.  The various methods of treatment have been discussed with the patient and family. After consideration of risks, benefits and other options for treatment, the patient has consented to  Procedure(s): ESOPHAGOGASTRODUODENOSCOPY (EGD) WITH PROPOFOL (N/A) COLONOSCOPY WITH PROPOFOL (N/A) as a surgical intervention.  The patient's history has been reviewed, patient examined, no change in status, stable for surgery.  I have reviewed the patient's chart and labs.  Questions were answered to the patient's satisfaction.     Candice Tobey

## 2021-05-03 ENCOUNTER — Encounter (HOSPITAL_COMMUNITY): Payer: Self-pay | Admitting: Gastroenterology

## 2021-05-03 ENCOUNTER — Encounter: Payer: Self-pay | Admitting: Nurse Practitioner

## 2021-05-03 DIAGNOSIS — K922 Gastrointestinal hemorrhage, unspecified: Secondary | ICD-10-CM | POA: Diagnosis not present

## 2021-05-03 DIAGNOSIS — D62 Acute posthemorrhagic anemia: Secondary | ICD-10-CM

## 2021-05-03 DIAGNOSIS — K31811 Angiodysplasia of stomach and duodenum with bleeding: Secondary | ICD-10-CM | POA: Diagnosis not present

## 2021-05-03 DIAGNOSIS — Z6833 Body mass index (BMI) 33.0-33.9, adult: Secondary | ICD-10-CM

## 2021-05-03 DIAGNOSIS — R17 Unspecified jaundice: Secondary | ICD-10-CM

## 2021-05-03 DIAGNOSIS — I10 Essential (primary) hypertension: Secondary | ICD-10-CM

## 2021-05-03 DIAGNOSIS — E661 Drug-induced obesity: Secondary | ICD-10-CM

## 2021-05-03 DIAGNOSIS — K31819 Angiodysplasia of stomach and duodenum without bleeding: Secondary | ICD-10-CM

## 2021-05-03 DIAGNOSIS — E039 Hypothyroidism, unspecified: Secondary | ICD-10-CM

## 2021-05-03 LAB — CBC
HCT: 24.8 % — ABNORMAL LOW (ref 36.0–46.0)
Hemoglobin: 7.4 g/dL — ABNORMAL LOW (ref 12.0–15.0)
MCH: 25 pg — ABNORMAL LOW (ref 26.0–34.0)
MCHC: 29.8 g/dL — ABNORMAL LOW (ref 30.0–36.0)
MCV: 83.8 fL (ref 80.0–100.0)
Platelets: 97 10*3/uL — ABNORMAL LOW (ref 150–400)
RBC: 2.96 MIL/uL — ABNORMAL LOW (ref 3.87–5.11)
RDW: 20.5 % — ABNORMAL HIGH (ref 11.5–15.5)
WBC: 4.7 10*3/uL (ref 4.0–10.5)
nRBC: 0 % (ref 0.0–0.2)

## 2021-05-03 LAB — COMPREHENSIVE METABOLIC PANEL
ALT: 23 U/L (ref 0–44)
AST: 48 U/L — ABNORMAL HIGH (ref 15–41)
Albumin: 2.3 g/dL — ABNORMAL LOW (ref 3.5–5.0)
Alkaline Phosphatase: 82 U/L (ref 38–126)
Anion gap: 3 — ABNORMAL LOW (ref 5–15)
BUN: 14 mg/dL (ref 8–23)
CO2: 21 mmol/L — ABNORMAL LOW (ref 22–32)
Calcium: 8.5 mg/dL — ABNORMAL LOW (ref 8.9–10.3)
Chloride: 113 mmol/L — ABNORMAL HIGH (ref 98–111)
Creatinine, Ser: 1.03 mg/dL — ABNORMAL HIGH (ref 0.44–1.00)
GFR, Estimated: 57 mL/min — ABNORMAL LOW (ref >60)
Glucose, Bld: 124 mg/dL — ABNORMAL HIGH (ref 70–99)
Potassium: 3.6 mmol/L (ref 3.5–5.1)
Sodium: 137 mmol/L (ref 135–145)
Total Bilirubin: 2.4 mg/dL — ABNORMAL HIGH (ref 0.3–1.2)
Total Protein: 5.7 g/dL — ABNORMAL LOW (ref 6.5–8.1)

## 2021-05-03 LAB — HEPATITIS B SURFACE ANTIGEN: Hepatitis B Surface Ag: NONREACTIVE

## 2021-05-03 LAB — PHOSPHORUS: Phosphorus: 3.1 mg/dL (ref 2.5–4.6)

## 2021-05-03 LAB — PREPARE RBC (CROSSMATCH)

## 2021-05-03 LAB — HEPATITIS B SURFACE ANTIBODY, QUANTITATIVE: Hep B S AB Quant (Post): <3.1 m[IU]/mL — ABNORMAL LOW (ref >9.9)

## 2021-05-03 LAB — AFP TUMOR MARKER: AFP, Serum, Tumor Marker: 3.5 ng/mL (ref 0.0–9.2)

## 2021-05-03 LAB — MAGNESIUM: Magnesium: 1.8 mg/dL (ref 1.7–2.4)

## 2021-05-03 LAB — ALPHA-1-ANTITRYPSIN: A-1 Antitrypsin, Ser: 134 mg/dL (ref 101–187)

## 2021-05-03 LAB — IGG: IgG (Immunoglobin G), Serum: 1805 mg/dL — ABNORMAL HIGH (ref 586–1602)

## 2021-05-03 LAB — CK: Total CK: 161 U/L (ref 38–234)

## 2021-05-03 LAB — HEMOGLOBIN AND HEMATOCRIT, BLOOD
HCT: 29 % — ABNORMAL LOW (ref 36.0–46.0)
Hemoglobin: 9.1 g/dL — ABNORMAL LOW (ref 12.0–15.0)

## 2021-05-03 LAB — ANA: Anti Nuclear Antibody (ANA): NEGATIVE

## 2021-05-03 MED ORDER — SODIUM CHLORIDE 0.9% IV SOLUTION: Freq: Once | INTRAVENOUS | Status: AC

## 2021-05-03 MED ORDER — PANTOPRAZOLE SODIUM 40 MG PO TBEC: 40.0000 mg | DELAYED_RELEASE_TABLET | Freq: Every morning | ORAL | 1 refills | Status: DC

## 2021-05-03 MED ORDER — LISINOPRIL-HYDROCHLOROTHIAZIDE 20-12.5 MG PO TABS: 1.0000 | ORAL_TABLET | Freq: Every day | ORAL | Status: DC

## 2021-05-03 NOTE — Progress Notes (Signed)
Progress Note  Chief Complaint:    anemia     ASSESSMENT / PLAN:    Brief History: 75 yo female with past medical history significant for HTN, HL, gout, cholecystectomy. She was admitted 7/23 for symptomatic anemia.   # Upper GI bleed secondary to GAVE s/p APC yesterday. Patient says she never did have any signs of overt bleeding. Discussed procedure findings with her.   # Acute blood loss anemia ( hgb 6.7) , received a unit of PRBC yesterday and is currently getting a 2nd unit.  --If post transfusion H+H is acceptable patient can be discharged from GI standpoint.  --Follow up with Carl Best, PA in our office on 8/29 at 10 am.  --Advised to avoid NSAIDS --Upon discharge she should continue pantoprazole 40 mg q am ( post APC) until seen in our offce.   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 No evidence for hepatic decompensation. Etiology of cirrhosis unclear, probably NASH but workup in progress. Patient says she was told 8 year ago that she had cirrhosis.  --[place] screening: none on EGD this admission  --[place] screening. No focal liver lesions on [place]. AFP  normal  --Awaiting hepatic serologic workup, will review results at office follow up.  --Doesn't need HAV vaccine, labs for HBV pending.  --Continue with outpatient workup up   05/02/21 Colonoscopy  --Non-bleeding external and internal hemorrhoids. - The examination was otherwise normal. - No specimens collected/22 colonoscopy    05/02/21 EGD  -Z-line regular, 36 cm from the incisors. - No gross lesions in esophagus. - Gastric antral vascular ectasia. Treated with argon plasma coagulation (APC). Likely etiology for GI blood loss and anemia - Normal examined duodenum. - No specimens collected     SUBJECTIVE:   Feels fine. No complaints / issues    OBJECTIVE:    Scheduled inpatient medications:   allopurinol  300 mg Oral Daily   busPIRone  10 mg Oral Daily    levothyroxine  100 mcg Oral QAC breakfast   metoprolol succinate  25 mg Oral Daily   pantoprazole (PROTONIX) IV  40 mg Intravenous BID   pravastatin  40 mg Oral Daily   Continuous inpatient infusions:  PRN inpatient medications: acetaminophen **OR** acetaminophen, bisacodyl, HYDROcodone-acetaminophen, morphine injection, ondansetron **OR** ondansetron (ZOFRAN) IV, senna-docusate  Vital signs in last 24 hours: Temp:  [97.5 F (36.4 C)-98.5 F (36.9 C)] 97.9 F (36.6 C) (07/26 0815) Pulse Rate:  [68-77] 70 (07/26 0815) Resp:  [15-20] 20 (07/26 0815) BP: (128-152)/(42-90) 150/63 (07/26 0815) SpO2:  [97 %-100 %] 100 % (07/26 0815) Weight:  [97.7 kg-99.8 kg] 97.7 kg (07/26 0604) Last BM Date: 05/01/21  Intake/Output Summary (Last 24 hours) at 05/03/2021 0854 Last data filed at 05/03/2021 0816 Gross per 24 hour  Intake 1380 ml  Output 0 ml  Net 1380 ml     Physical Exam:  General: Alert female in NAD Heart:  Regular rate and rhythm. No lower extremity edema Pulmonary: Normal respiratory effort Abdomen: Soft, nondistended, nontender. Normal bowel sounds.  Neurologic: Alert and oriented Psych: Pleasant. Cooperative.   Filed Weights   04/30/21 1318 05/02/21 1133 05/03/21 0604  Weight: 99.8 kg 99.8 kg 97.7 kg    Intake/Output from previous day: 07/25 0701 - 07/26 0700 In: 1140 [P.O.:740; I.V.:400] Out: 0  Intake/Output this shift: Total I/O In: 240 [P.O.:240] Out: -     Lab Results: Recent Labs    05/01/21 0430 05/01/21 1550 05/02/21 0327  05/02/21 0747 05/02/21 1834 05/03/21 0354  WBC 6.1  --  5.7  --   --  4.7  HGB 6.7*   < > 7.6* 7.6* 7.9* 7.4*  HCT 22.1*   < > 25.6* 26.0* 26.5* 24.8*  PLT 113*  --  111*  --   --  97*   < > = values in this interval not displayed.   BMET Recent Labs    05/01/21 0430 05/02/21 0327 05/03/21 0354  NA 137 143 137  K 3.4* 4.0 3.6  CL 107 115* 113*  CO2 20* 19* 21*  GLUCOSE 93 82 124*  BUN '16 15 14  '$ CREATININE 0.98  0.94 1.03*  CALCIUM 8.7* 8.9 8.5*   LFT Recent Labs    05/01/21 0430 05/02/21 0327 05/03/21 0354  PROT 5.6*   < > 5.7*  ALBUMIN 2.3*   < > 2.3*  AST 59*   < > 48*  ALT 26   < > 23  ALKPHOS 74   < > 82  BILITOT 3.1*   < > 2.4*  BILIDIR 1.1*  --   --    < > = values in this interval not displayed.   PT/INR Recent Labs    05/01/21 0430 05/02/21 0327  LABPROT 20.1* 19.9*  INR 1.7* 1.7*   Hepatitis Panel Recent Labs    05/02/21 0326 05/02/21 0327  HEPBSAG NON REACTIVE  --   HCVAB  --  NON REACTIVE    US Abdomen Complete  Result Date: 05/01/2021 CLINICAL DATA:  Elevated liver function tests and any EXAM: ABDOMEN ULTRASOUND COMPLETE COMPARISON:  [facility] 07/20/2011 FINDINGS: Gallbladder: History of cholecystectomy Common bile duct: Diameter: 5 mm Liver: Broad fissures and lobulated liver surface. No evidence of mass. Portal vein is patent on color Doppler imaging with normal direction of blood flow towards the liver. IVC: Limited visualization which is negative for pathologic finding Pancreas: Obscured by bowel gas Spleen: 350 cc volume, upper normal.  No focal abnormality. Right Kidney: Length: 10.6. Echogenicity within normal limits. No mass or hydronephrosis visualized. Left Kidney: Length: 11.4. Echogenicity within normal limits. No mass or hydronephrosis visualized. Abdominal aorta: Obscured by bowel gas. IMPRESSION: 1. No acute finding. 2. Lobulated liver surface that is suspicious for cirrhosis. 3. Bowel gas obscures the aorta and pancreas. Electronically Signed   By: Monte Fantasia M.D.   On: 05/01/2021 09:52        Principal Problem:   Gastrointestinal hemorrhage Active Problems:   Anemia associated with acute blood loss   Elevated bilirubin   Hypothyroidism (acquired)   Primary hypertension   Gout   Symptomatic anemia   GAVE (gastric antral vascular ectasia)   Acute upper GI bleed     LOS: 1 day   Tye Savoy ,NP 05/03/2021, 8:54 AM

## 2021-05-03 NOTE — Discharge Summary (Signed)
Physician Discharge Summary  Ann Mcconnell [id number] DOB: Apr 27, 1946 DOA: 04/30/2021  PCP: Maury Dus, MD  Admit date: 04/30/2021 Discharge date: 05/03/2021  Admitted From: Home Disposition: Home  Recommendations for Outpatient Follow-up:  Follow ups as below. Please obtain CBC/BMP/Mag at follow up May benefit from hepatitis B vaccine Please follow up on the following pending results: Mitochondrial antibodies and anti-smooth muscle antibody pending.  Home Health: Not indicated Equipment/Devices: Not indicated  Discharge Condition: Stable CODE STATUS: Full code   Follow-up Information     Noralyn Pick, NP Follow up on 06/06/2021.   Specialty: Gastroenterology Why: at 10: 00 am. Please arrive 10 minutes early Contact information: [place] [address] [phone]         Maury Dus, MD. Schedule an appointment as soon as possible for a visit in 1 week(s).   Specialty: Family Medicine Why: To have your blood levels and blood pressure checked Contact information: [place] [address] [phone]                 Hospital Course: 75 year old F with PMH of hypothyroidism, HTN and fatty liver disease directed to ED by PCP for low hemoglobin.  She was seen by PCP for progressive dyspnea on exertion, lightheadedness and fatigue, and had a CBC that was significant for low hemoglobin, and she was directed to ED.  Patient denies melena, hematochezia or easy bruising.  Not on blood thinner or NSAIDs.  She denies GERD or heartburn.   In ED, Hgb 8.2 but dropped further to 6.7.  Hemoccult positive.  Total bili 3.5 with indirect predominance. GI consulted.  Transfused 2 units with appropriate response.  LDH and haptoglobin negative.  RUQ ultrasound raised concern for cirrhosis which was not noted on EGD.  AFP tumor marker negative as well.  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  Treated with APC.   Colonoscopy with nonbleeding internal and external hemorrhoids.  Acute hepatitis panel positive for hepatitis A IgM.  See individual problem list below for more hospital course.  Discharge Diagnoses:  Acute blood loss anemia likely due to upper GIB-unknown baseline.  EGD and colonoscopy as above.  Possible cirrhosis on RUQ which could be contributing as well.  Has hyperbilirubinemia with indirect predominance suggesting hemolysis but normal LDH and haptoglobin argues against this.  Anemia panel with some degree of iron deficiency.  Appropriate response after 2 units. Recent Labs    04/30/21 1411 05/01/21 0430 05/01/21 1550 05/01/21 2022 05/02/21 0327 05/02/21 0747 05/02/21 1834 05/03/21 0354 05/03/21 1338  HGB 8.2* 6.7* 7.9* 9.1* 7.6* 7.6* 7.9* 7.4* 9.1*  -Discharged on p.o. Protonix 40 mg daily per GI recommendation -Advised to avoid any over-the-counter pain medication -GI follow-up arranged.     Mildly elevated AST/hyperbilirubinemia/coagulopathy-denies alcohol use.  Has history of fatty liver.  CK within normal.  RUQ ultrasound concerning for liver cirrhosis but not noted on EGD.  Acute hepatitis A IgM positive. -Recheck LFT at follow-up   Hypothyroidism: TSH within normal. -Continue home Synthroid   Hypokalemia/hypomagnesemia: Resolved.   Thrombocytopenia: Could be due to cirrhosis.  Stable. -Recheck CBC at follow-up   Class I obesity Body mass index is 33.73 kg/m.  -Encourage lifestyle change to lose weight          Discharge Exam: Vitals:   05/03/21 1117 05/03/21 1346  BP: 140/65 132/68  Pulse: 69 67  Resp: 20 16  Temp: 98.3 F (  36.8 C) 97.9 F (36.6 C)  SpO2: 100% 98%    GENERAL: No apparent distress.  Nontoxic. HEENT: MMM.  Vision and hearing grossly intact.  NECK: Supple.  No apparent JVD.  RESP: On RA.  No IWOB.  Fair aeration bilaterally. CVS:  RRR. Heart sounds normal.  ABD/GI/GU: Bowel sounds present. Soft. Non tender.  MSK/EXT:  Moves  extremities. No apparent deformity. No edema.  SKIN: no apparent skin lesion or wound NEURO: Awake, alert and oriented appropriately.  No apparent focal neuro deficit. PSYCH: Calm. Normal affect.  Discharge Instructions  Discharge Instructions     Call MD for:  difficulty breathing, headache or visual disturbances   Complete by: As directed    Call MD for:  extreme fatigue   Complete by: As directed    Call MD for:  persistant dizziness or light-headedness   Complete by: As directed    Call MD for:  severe uncontrolled pain   Complete by: As directed    Diet - low sodium heart healthy   Complete by: As directed    Discharge instructions   Complete by: As directed    It has been a pleasure taking care of you!  You were hospitalized with generalized weakness, dizziness and shortness of breath likely due to blood loss anemia from gastrointestinal bleed from abnormal blood vessel in the stomach.  This has been treated surgically.  Your symptoms are not anemia improved after blood transfusion.  We are discharging you on Protonix as recommended by gastroenterologist.  It is very important that you take your medications as prescribed.  Please review your new medication list and the directions on your medications before you take them.  Avoid any over-the-counter pain medication.  Follow-up with your primary care doctor in about a week to have your blood levels rechecked.  Follow-up with your gastroenterologist as recommended to you.    Take care,   Increase activity slowly   Complete by: As directed       Allergies as of 05/03/2021   No Known Allergies      Medication List     STOP taking these medications    lisinopril 20 MG tablet Commonly known as: ZESTRIL       TAKE these medications    allopurinol 300 MG tablet Commonly known as: ZYLOPRIM Take 300 mg by mouth daily.   amLODipine 5 MG tablet Commonly known as: NORVASC Take 5 mg by mouth daily.   busPIRone 10 MG  tablet Commonly known as: BUSPAR Take 10 mg by mouth daily.   CALCIUM PO Take 1 Dose by mouth daily.   citalopram 40 MG tablet Commonly known as: CELEXA Take 40 mg by mouth daily.   FISH OIL PO Take 1 Dose by mouth daily.   GLUCOSAMINE PO Take 1 Dose by mouth daily.   levothyroxine 100 MCG tablet Commonly known as: SYNTHROID Take 100 mcg by mouth daily. What changed: Another medication with the same name was removed. Continue taking this medication, and follow the directions you see here.   lisinopril-hydrochlorothiazide 20-12.5 MG tablet Commonly known as: ZESTORETIC Take 1 tablet by mouth daily. Start taking on: May 09, 2021 What changed: These instructions start on May 09, 2021. If you are unsure what to do until then, ask your doctor or other care provider.   metFORMIN 500 MG 24 hr tablet Commonly known as: GLUCOPHAGE-XR Take 1,000 mg by mouth daily.   metoprolol succinate 100 MG 24 hr tablet Commonly known as: TOPROL-XL  Take 100 mg by mouth daily. Take with or immediately following a meal.   pantoprazole 40 MG tablet Commonly known as: Protonix Take 1 tablet (40 mg total) by mouth in the morning.   pravastatin 40 MG tablet Commonly known as: PRAVACHOL Take 40 mg by mouth daily.   VITAMIN B-12 PO Take 1 Dose by mouth daily.   VITAMIN D3 PO Take 1 Dose by mouth daily.        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  Treated with APC.  7/25-Colo with nonbleeding internal and external hemorrhoids.    US Abdomen Complete  Result Date: 05/01/2021 CLINICAL DATA:  Elevated liver function tests and any EXAM: ABDOMEN ULTRASOUND COMPLETE COMPARISON:  [facility] 07/20/2011 FINDINGS: Gallbladder: History of cholecystectomy Common bile duct: Diameter: 5 mm Liver: Broad fissures and lobulated liver surface. No evidence of mass. Portal vein is patent on color Doppler imaging with normal direction of blood  flow towards the liver. IVC: Limited visualization which is negative for pathologic finding Pancreas: Obscured by bowel gas Spleen: 350 cc volume, upper normal.  No focal abnormality. Right Kidney: Length: 10.6. Echogenicity within normal limits. No mass or hydronephrosis visualized. Left Kidney: Length: 11.4. Echogenicity within normal limits. No mass or hydronephrosis visualized. Abdominal aorta: Obscured by bowel gas. IMPRESSION: 1. No acute finding. 2. Lobulated liver surface that is suspicious for cirrhosis. 3. Bowel gas obscures the aorta and pancreas. Electronically Signed   By: Monte Fantasia M.D.   On: 05/01/2021 09:52       The results of significant diagnostics from this hospitalization (including imaging, microbiology, ancillary and laboratory) are listed below for reference.     Microbiology: Recent Results (from the past 240 hour(s))  Resp Panel by RT-PCR (Flu A&B, Covid) Nasopharyngeal Swab     Status: None   Collection Time: 04/30/21 10:47 PM   Specimen: Nasopharyngeal Swab; Nasopharyngeal(NP) swabs in vial transport medium  Result Value Ref Range Status   SARS Coronavirus 2 by RT PCR NEGATIVE NEGATIVE Final    Comment: (NOTE) SARS-CoV-2 target nucleic acids are NOT DETECTED.  The SARS-CoV-2 RNA is generally detectable in upper respiratory specimens during the acute phase of infection. The lowest concentration of SARS-CoV-2 viral copies this assay can detect is 138 copies/mL. A negative result does not preclude SARS-Cov-2 infection and should not be used as the sole basis for treatment or other patient management decisions. A negative result may occur with  improper specimen collection/handling, submission of specimen other than nasopharyngeal swab, presence of viral mutation(s) within the areas targeted by this assay, and inadequate number of viral copies(<138 copies/mL). A negative result must be combined with clinical observations, patient history, and  epidemiological information. The expected result is Negative.  Fact Sheet for Patients:  EntrepreneurPulse.com.au  Fact Sheet for Healthcare Providers:  IncredibleEmployment.be  This test is no t yet approved or cleared by the [place] FDA and  has been authorized for detection and/or diagnosis of SARS-CoV-2 by FDA under an Emergency Use Authorization (EUA). This EUA will remain  in effect (meaning this test can be used) for the duration of the COVID-19 declaration under Section 564(b)(1) of the Act, 21 U.S.C.section 360bbb-3(b)(1), unless the authorization is terminated  or revoked sooner.       Influenza A by PCR NEGATIVE NEGATIVE Final   Influenza B by PCR NEGATIVE NEGATIVE Final    Comment: (NOTE) The Xpert Xpress SARS-CoV-2/FLU/RSV plus assay is intended as an aid in  the diagnosis of influenza from Nasopharyngeal swab specimens and should not be used as a sole basis for treatment. Nasal washings and aspirates are unacceptable for Xpert Xpress SARS-CoV-2/FLU/RSV testing.  Fact Sheet for Patients: EntrepreneurPulse.com.au  Fact Sheet for Healthcare Providers: IncredibleEmployment.be  This test is not yet approved or cleared by the [place] FDA and has been authorized for detection and/or diagnosis of SARS-CoV-2 by FDA under an Emergency Use Authorization (EUA). This EUA will remain in effect (meaning this test can be used) for the duration of the COVID-19 declaration under Section 564(b)(1) of the Act, 21 U.S.C. section 360bbb-3(b)(1), unless the authorization is terminated or revoked.  Performed at [facility], [place] [address] [place], [place] [address]      Labs:  CBC: Recent Labs  Lab 04/30/21 1411 05/01/21 0430 05/01/21 1550 05/02/21 0327 05/02/21 0747 05/02/21 1834 05/03/21 0354 05/03/21 1338  WBC 8.1 6.1  --  5.7  --   --  4.7  --   NEUTROABS 4.2   --   --   --   --   --   --   --   HGB 8.2* 6.7*   < > 7.6* 7.6* 7.9* 7.4* 9.1*  HCT 27.2* 22.1*   < > 25.6* 26.0* 26.5* 24.8* 29.0*  MCV 82.4 82.5  --  83.1  --   --  83.8  --   PLT 165 113*  --  111*  --   --  97*  --    < > = values in this interval not displayed.   BMP &GFR Recent Labs  Lab 04/30/21 1411 05/01/21 0430 05/01/21 0720 05/02/21 0327 05/03/21 0354  NA 138 137  --  143 137  K 3.9 3.4*  --  4.0 3.6  CL 108 107  --  115* 113*  CO2 21* 20*  --  19* 21*  GLUCOSE 121* 93  --  82 124*  BUN 13 16  --  15 14  CREATININE 0.87 0.98  --  0.94 1.03*  CALCIUM 9.2 8.7*  --  8.9 8.5*  MG  --   --  1.7 2.0 1.8  PHOS  --   --   --   --  3.1   Estimated Creatinine Clearance: 56.6 mL/min (A) (by C-G formula based on SCr of 1.03 mg/dL (H)). Liver & Pancreas: Recent Labs  Lab 04/30/21 1411 05/01/21 0430 05/02/21 0327 05/03/21 0354  AST 69* 59* 51* 48*  ALT '31 26 24 23  '$ ALKPHOS 89 74 82 82  BILITOT 3.5* 3.1* 3.7* 2.4*  PROT 6.8 5.6* 6.1* 5.7*  ALBUMIN 2.8* 2.3* 2.6* 2.3*   No results for input(s): LIPASE, AMYLASE in the last 168 hours. No results for input(s): AMMONIA in the last 168 hours. Diabetic: No results for input(s): HGBA1C in the last 72 hours. No results for input(s): GLUCAP in the last 168 hours. Cardiac Enzymes: Recent Labs  Lab 05/01/21 0720 05/03/21 0354  CKTOTAL 110 161   No results for input(s): PROBNP in the last 8760 hours. Coagulation Profile: Recent Labs  Lab 05/01/21 0430 05/02/21 0327  INR 1.7* 1.7*   Thyroid Function Tests: Recent Labs    05/02/21 0326  TSH 0.578   Lipid Profile: No results for input(s): CHOL, HDL, LDLCALC, TRIG, CHOLHDL, LDLDIRECT in the last 72 hours. Anemia Panel: Recent Labs    05/01/21 0721  VITAMINB12 1,334*  FOLATE 11.9  FERRITIN 15  TIBC 354  IRON 83  RETICCTPCT 1.9  Urine analysis: No results found for: COLORURINE, APPEARANCEUR, LABSPEC, PHURINE, GLUCOSEU, HGBUR, BILIRUBINUR, KETONESUR,  PROTEINUR, UROBILINOGEN, NITRITE, LEUKOCYTESUR Sepsis Labs: Invalid input(s): PROCALCITONIN, LACTICIDVEN   Time coordinating discharge: 40 minutes  SIGNED:  Mercy Riding, MD  [facility] 05/03/2021, 5:11 PM  If 7PM-7AM, please contact night-coverage [email or web address]

## 2021-05-04 LAB — TYPE AND SCREEN
ABO/RH(D): O POS
Antibody Screen: NEGATIVE
Unit division: 0
Unit division: 0

## 2021-05-04 LAB — BPAM RBC
Blood Product Expiration Date: 202208222359
Blood Product Expiration Date: 202208232359
ISSUE DATE / TIME: 202207240857
ISSUE DATE / TIME: 202207260750
Unit Type and Rh: 5100
Unit Type and Rh: 5100

## 2021-05-04 LAB — ANTI-SMOOTH MUSCLE ANTIBODY, IGG: F-Actin IgG: 8 Units (ref 0–19)

## 2021-05-04 LAB — MITOCHONDRIAL ANTIBODIES: Mitochondrial M2 Ab, IgG: <20 Units (ref 0.0–20.0)

## 2021-05-11 DIAGNOSIS — R296 Repeated falls: Secondary | ICD-10-CM | POA: Diagnosis not present

## 2021-05-11 DIAGNOSIS — D500 Iron deficiency anemia secondary to blood loss (chronic): Secondary | ICD-10-CM | POA: Diagnosis not present

## 2021-05-17 DIAGNOSIS — E1169 Type 2 diabetes mellitus with other specified complication: Secondary | ICD-10-CM | POA: Diagnosis not present

## 2021-05-17 DIAGNOSIS — E039 Hypothyroidism, unspecified: Secondary | ICD-10-CM | POA: Diagnosis not present

## 2021-05-17 DIAGNOSIS — D649 Anemia, unspecified: Secondary | ICD-10-CM | POA: Diagnosis not present

## 2021-05-17 DIAGNOSIS — E1165 Type 2 diabetes mellitus with hyperglycemia: Secondary | ICD-10-CM | POA: Diagnosis not present

## 2021-05-17 DIAGNOSIS — I10 Essential (primary) hypertension: Secondary | ICD-10-CM | POA: Diagnosis not present

## 2021-05-17 DIAGNOSIS — D500 Iron deficiency anemia secondary to blood loss (chronic): Secondary | ICD-10-CM | POA: Diagnosis not present

## 2021-05-17 DIAGNOSIS — E782 Mixed hyperlipidemia: Secondary | ICD-10-CM | POA: Diagnosis not present

## 2021-05-17 DIAGNOSIS — N1831 Chronic kidney disease, stage 3a: Secondary | ICD-10-CM | POA: Diagnosis not present

## 2021-05-18 ENCOUNTER — Encounter: Payer: Self-pay | Admitting: Neurology

## 2021-06-05 NOTE — Progress Notes (Signed)
06/05/2021 Ann Mcconnell [id number] [birth year]   CHIEF COMPLAINT: Anemia, hospital follow up  HISTORY OF PRESENT ILLNESS:  Ann Mcconnell is a 75 year old female with a past medical history of hypertension, hyperlipidemia, hypothyroidism, gout and fatty liver.  Past cholecystectomy, breast reduction surgery and left arm surgery.  She presents to our office today for follow up regarding her recent hospitalization for anemia. She is accompanied by her son. She presented to the ED on 04/30/2021 secondary to experiencing fatigue, heart racing with a near syncopal episode on 04/29/2021.  I saw her in the hospital on 05/01/2021, refer to my GI consult note for comprehensive history review. Labs in the ED showed a Hemoglobin level of 8.2 (baseline Hg unknown, labs from PCP not in Epic or Care Everywhere). Hematocrit 27.2.  MCV 82.4.  Platelet 165.  WBC 8.1.  BUN 13.  Creatinine 0.87.  Alk phos 89.  Albumin 2.8.  AST 69.  ALT 31.  Total bili 3.5.  SARS coronavirus 2 negative.  FOBT positive. No overt GI bleeding. Her hemoglobin level dropped to 6.7 and she was transfused 1 unit or PRBCs. INR 1.7. She underwent an EGD and colonoscopy by Dr. Silverio Decamp on 05/02/2021. 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 Likely etiology for GI blood loss and anemia. The colonoscopy showed internal and external hemorrhoids otherwise was negative. Her Hg level dropped to 7.4 and she was transfused a 2nd unit of PRBCs on 7/26. Post transfusion Hg 9.1 and she was discharged home. She was advised to continue taking Pantoprazole 30m QD for a few more weeks as instructed by Dr. CCandis Schatzat the time of discharge.   Currently, she reports having good days and bad days.  Her good days consist of walking without trembling or weakness.  She describes bad days when  feeling shaky and weak.  She has 3-4 good days weekly since being discharged from the hospital.  She denies having  any nausea or vomiting.  No upper or lower abdominal pain.  Her appetite is poor.  She does not feel as if she is lost any weight.  She has swelling in her legs which has improved since her hospital discharge.  She is passing normal formed brown bowel movement daily.  No rectal bleeding or black stools.  No chest pain, palpitations or shortness of breath.  She reports having a history of fatty liver but denies ever being told she had cirrhosis until her recent hospitalization.    CBC Latest Ref Rng & Units 05/03/2021 05/03/2021 05/02/2021  WBC 4.0 - 10.5 K/uL - 4.7 -  Hemoglobin 12.0 - 15.0 g/dL 9.1(L) 7.4(L) 7.9(L)  Hematocrit 36.0 - 46.0 % 29.0(L) 24.8(L) 26.5(L)  Platelets 150 - 400 K/uL - 97(L) -    CMP Latest Ref Rng & Units 05/03/2021 05/02/2021 05/01/2021  Glucose 70 - 99 mg/dL 124(H) 82 93  BUN 8 - 23 mg/dL _0 Creatinine 0.44 - 1.00 mg/dL 1.03(H) 0.94 0.98  Sodium 135 - 145 mmol/L 137 143 137  Potassium 3.5 - 5.1 mmol/L 3.6 4.0 3.4(L)  Chloride 98 - 111 mmol/L 113(H) 115(H) 107  CO2 22 - 32 mmol/L 21(L) 19(L) 20(L)  Calcium 8.9 - 10.3 mg/dL 8.5(L) 8.9 8.7(L)  Total Protein 6.5 - 8.1 g/dL 5.7(L) 6.1(L) 5.6(L)  Total Bilirubin 0.3 - 1.2 mg/dL 2.4(H) 3.7(H) 3.1(H)  Alkaline Phos 38 - 126 U/L 82 82 74  AST 15 - 41 U/L  48(H) 51(H) 59(H)  ALT 0 - 44 U/L _0 EGD 05/02/2021: - Z-line regular, 36 cm from the incisors. - No gross lesions in esophagus. - Gastric antral vascular ectasia. Treated with argon plasma coagulation (APC). Likely etiology for GI blood loss and anemia - Normal examined duodenum. - No specimens collected.  Colonoscopy 05/02/2021: - Non-bleeding external and internal hemorrhoids. - The examination was otherwise normal. - No specimens collected. - No repeat colonoscopy due to age   Abdominal sonogram 05/01/2021: 1. No acute finding. 2. Lobulated liver surface that is suspicious for cirrhosis. 3. Bowel gas obscures the aorta and pancreas. 4. Spleen:  350 cc volume, upper normal.  No focal abnormality.    Past Medical History:  Diagnosis Date   DOE (dyspnea on exertion)    Gout    HLD (hyperlipidemia)    HTN (hypertension)    Hypothyroidism    Tachycardia    Past Surgical History:  Procedure Laterality Date   Arm Surgery     BREAST REDUCTION SURGERY     CHOLECYSTECTOMY     COLONOSCOPY WITH PROPOFOL N/A 05/02/2021   Procedure: COLONOSCOPY WITH PROPOFOL;  Surgeon: Mauri Pole, MD;  Location: [facility];  Service: Endoscopy;  Laterality: N/A;   ESOPHAGOGASTRODUODENOSCOPY (EGD) WITH PROPOFOL N/A 05/02/2021   Procedure: ESOPHAGOGASTRODUODENOSCOPY (EGD) WITH PROPOFOL;  Surgeon: Mauri Pole, MD;  Location: [facility];  Service: Endoscopy;  Laterality: N/A;   HOT HEMOSTASIS N/A 05/02/2021   Procedure: HOT HEMOSTASIS (ARGON PLASMA COAGULATION/BICAP);  Surgeon: Mauri Pole, MD;  Location: [facility];  Service: Endoscopy;  Laterality: N/A;   REDUCTION MAMMAPLASTY     Social History: She lives by her self.  Non-smoker.  No alcohol use.  Family History: No family history of esophageal or gastric cancer.  Her son has familial polyposis with at least one cancerous polyp which she reported was paternally driven.   No Known Allergies    Outpatient Encounter Medications as of 06/06/2021  Medication Sig   allopurinol (ZYLOPRIM) 300 MG tablet Take 300 mg by mouth daily.   amLODipine (NORVASC) 5 MG tablet Take 5 mg by mouth daily.   busPIRone (BUSPAR) 10 MG tablet Take 10 mg by mouth daily.   CALCIUM PO Take 1 Dose by mouth daily.   Cholecalciferol (VITAMIN D3 PO) Take 1 Dose by mouth daily.   citalopram (CELEXA) 40 MG tablet Take 40 mg by mouth daily.   Cyanocobalamin (VITAMIN B-12 PO) Take 1 Dose by mouth daily.   Glucosamine HCl (GLUCOSAMINE PO) Take 1 Dose by mouth daily.   levothyroxine (SYNTHROID) 100 MCG tablet Take 100 mcg by mouth daily.   lisinopril-hydrochlorothiazide (ZESTORETIC) 20-12.5 MG tablet Take  1 tablet by mouth daily.   metFORMIN (GLUCOPHAGE-XR) 500 MG 24 hr tablet Take 1,000 mg by mouth daily.   metoprolol succinate (TOPROL-XL) 100 MG 24 hr tablet Take 100 mg by mouth daily. Take with or immediately following a meal.   Omega-3 Fatty Acids (FISH OIL PO) Take 1 Dose by mouth daily.   pantoprazole (PROTONIX) 40 MG tablet Take 1 tablet (40 mg total) by mouth in the morning.   pravastatin (PRAVACHOL) 40 MG tablet Take 40 mg by mouth daily.   No facility-administered encounter medications on file as of 06/06/2021.     REVIEW OF SYSTEMS:  Gen: Denies fever, sweats or chills. No weight loss.  CV: Denies chest pain, palpitations or edema. Resp: Denies cough, shortness of breath of hemoptysis.  GI: See  HPI.   GU : Denies urinary burning, blood in urine, increased urinary frequency or incontinence. MS: + Weak knees, back pain.  Derm: Denies rash, itchiness, skin lesions or unhealing ulcers. Psych: + Mild memory issues/forgetful at times.  Heme: Denies bruising, bleeding. Neuro:  Denies headaches, dizziness or paresthesias. Endo:  Denies any problems with DM, thyroid or adrenal function.   PHYSICAL EXAM: BP (!) 130/50   Pulse 64   Ht 5' 7" (1.702 m)   Wt 220 lb 2 oz (99.8 kg)   BMI 34.48 kg/m  Wt Readings from Last 3 Encounters:  06/06/21 220 lb 2 oz (99.8 kg)  05/03/21 215 lb 6.2 oz (97.7 kg)    General: fatigued appearing 75 year old female in NAD using a walker to ambulate with assistance.  Head: Normocephalic and atraumatic. Eyes:  Sclerae non-icteric, conjunctive pink. Ears: Normal auditory acuity. Mouth: Dentition intact. No ulcers or lesions.  Neck: Supple, no lymphadenopathy or thyromegaly.  Lungs: Clear bilaterally to auscultation without wheezes, crackles or rhonchi. Heart: Regular rate and rhythm. No murmur, rub or gallop appreciated.  Abdomen: Soft, nontender, protuberant, non distended. No ascites. No masses. No hepatosplenomegaly. Normoactive bowel sounds x 4  quadrants.  Rectal: Deferred.  Musculoskeletal: Symmetrical with no gross deformities. Skin: Warm and dry. No rash or lesions on visible extremities. Extremities: Bilateral lower extremities with 1+ edema. Neurological: Alert oriented x 4.  Speech is clear.  Generalized weakness. Psychological:  Alert and cooperative. Normal mood and affect.  ASSESSMENT AND PLAN:  1) 75 year old female admitted to the hospital 04/30/2021 with generalized weakness and anemia. Admission Hg 8.2 -> 6.7. Transfused 1 units of PRBCs -> Hg 7.9 -> 9.1 -> 7.6 -> 7.4 -> transfused a 2nd unit of PRBCs on 7/26. 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 Likely etiology for GI blood loss and anemia. The colonoscopy showed internal and external hemorrhoids otherwise was negative. Hg level was 9.1 at time of discharge 05/03/2021. No overt GI bleeding.  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 ANA, AMA, SMA and A1AT levels were normal. IgG level elevated at 1805. Hepatitis B surface antigen negative. Hepatitis C antibody negative. AFP 3.5.  -IgG level, LKM -1 antibody (Liver Kidney Microsomal antibody)  -CMP -Hepatitis B vaccination series, first injection today  -Surveillance abdominal sonogram  and AFP due Jan 2023  3) Coagulopathy secondary to cirrhosis  -PT/INR  4) Thrombocytopenia, secondary to cirrhosis. No splenomegaly per abd dono, spleen 350 cc volume, upper normal.  No focal abnormality     CC:  Reade, Robert, MD    

## 2021-06-06 ENCOUNTER — Ambulatory Visit (INDEPENDENT_AMBULATORY_CARE_PROVIDER_SITE_OTHER): Payer: Medicare Other | Admitting: Nurse Practitioner

## 2021-06-06 ENCOUNTER — Encounter: Payer: Self-pay | Admitting: Nurse Practitioner

## 2021-06-06 ENCOUNTER — Other Ambulatory Visit (INDEPENDENT_AMBULATORY_CARE_PROVIDER_SITE_OTHER): Payer: Medicare Other

## 2021-06-06 ENCOUNTER — Telehealth: Payer: Self-pay | Admitting: Nurse Practitioner

## 2021-06-06 VITALS — BP 130/50 | HR 64 | Ht 67.0 in | Wt 220.1 lb

## 2021-06-06 DIAGNOSIS — D649 Anemia, unspecified: Secondary | ICD-10-CM

## 2021-06-06 DIAGNOSIS — K7460 Unspecified cirrhosis of liver: Secondary | ICD-10-CM | POA: Diagnosis not present

## 2021-06-06 DIAGNOSIS — Z23 Encounter for immunization: Secondary | ICD-10-CM

## 2021-06-06 LAB — CBC
HCT: 24.1 % — ABNORMAL LOW (ref 36.0–46.0)
Hemoglobin: 7.7 g/dL — CL (ref 12.0–15.0)
MCHC: 32.5 g/dL (ref 30.0–36.0)
MCV: 86.2 fl (ref 78.0–100.0)
Platelets: 160 10*3/uL (ref 150.0–400.0)
RBC: 2.8 Mil/uL — ABNORMAL LOW (ref 3.87–5.11)
RDW: 26.1 % — ABNORMAL HIGH (ref 11.5–15.5)
WBC: 9.2 10*3/uL (ref 4.0–10.5)

## 2021-06-06 LAB — COMPREHENSIVE METABOLIC PANEL
ALT: 23 U/L (ref 0–35)
AST: 41 U/L — ABNORMAL HIGH (ref 0–37)
Albumin: 2.9 g/dL — ABNORMAL LOW (ref 3.5–5.2)
Alkaline Phosphatase: 111 U/L (ref 39–117)
BUN: 27 mg/dL — ABNORMAL HIGH (ref 6–23)
CO2: 20 mEq/L (ref 19–32)
Calcium: 9 mg/dL (ref 8.4–10.5)
Chloride: 106 mEq/L (ref 96–112)
Creatinine, Ser: 1.45 mg/dL — ABNORMAL HIGH (ref 0.40–1.20)
GFR: 35.34 mL/min — ABNORMAL LOW (ref >60.00)
Glucose, Bld: 150 mg/dL — ABNORMAL HIGH (ref 70–99)
Potassium: 3.9 mEq/L (ref 3.5–5.1)
Sodium: 138 mEq/L (ref 135–145)
Total Bilirubin: 1.4 mg/dL — ABNORMAL HIGH (ref 0.2–1.2)
Total Protein: 6.9 g/dL (ref 6.0–8.3)

## 2021-06-06 LAB — PROTIME-INR
INR: 1.4 ratio — ABNORMAL HIGH (ref 0.8–1.0)
Prothrombin Time: 15.4 s — ABNORMAL HIGH (ref 9.6–13.1)

## 2021-06-06 MED ORDER — PANTOPRAZOLE SODIUM 40 MG PO TBEC: 40.0000 mg | DELAYED_RELEASE_TABLET | Freq: Every morning | ORAL | 0 refills | Status: AC

## 2021-06-06 NOTE — Addendum Note (Signed)
Addended by: Thedore Mins D on: 06/06/2021 03:50 PM   Modules accepted: Orders

## 2021-06-06 NOTE — Patient Instructions (Addendum)
LABS:  Lab work has been ordered for you today. Our lab is located in the basement. Press "B" on the elevator. The lab is located at the first door on the left as you exit the elevator.  HEALTHCARE LAWS AND MY CHART RESULTS: Due to recent changes in healthcare laws, you may see the results of your imaging and laboratory studies on MyChart before your provider has had a chance to review them.   We understand that in some cases there may be results that are confusing or concerning to you. Not all laboratory results come back in the same time frame and the provider may be waiting for multiple results in order to interpret others.  Please give [place] 48 hours in order for your provider to thoroughly review all the results before contacting the office for clarification of your results.   Continue Pantoprazole 40 MG once a day.  You have received a Hepatitis vaccine today, please return on 07/07/21 at 10:00 Am for a nurse visit to reviece the second injection.  We have scheduled you a follow up with Dr. Havery Moros on 07/28/21 at 10:30 am.  It was great seeing you today! Thank you for entrusting me with your care and choosing [facility].  Noralyn Pick, CRNP  The [place] [facility] providers would like to encourage you to use [facility] to communicate with providers for non-urgent requests or questions.  Due to long hold times on the telephone, sending your provider a message by [facility] may be faster and more efficient way to get a response. Please allow 48 business hours for a response.  Please remember that this is for non-urgent requests/questions.

## 2021-06-06 NOTE — Telephone Encounter (Signed)
I called the patient and discussed her hemoglobin level has dropped to 7.7, await iron levels.  If her iron levels are low I will order iron IV.  I will plan to repeat H&H in a few days.  If her hemoglobin level drops further she may require a blood transfusion.  She was advised to go to the emergency room if she develops any chest pain, palpitations, profound fatigue or shortness of breath.  She denies having any of the symptoms at this time.  I also discussed her creatinine level was elevated today and to hold her lisinopril/hydrochlorothiazide which is diuretic until she contacts her PCP Dr. Alyson Ingles she needs to be done today.  I informed the patient she is to restart Pantoprazole 40 mg daily and my nurse will send a new prescription to her pharmacy.  I also called her son Gene and left a detailed message with the above info.  Gene manages her medications and fills her pillbox at home.  Lauren, pls send RX for Pantoprazole '40mg'$  one po QD # 90, no additional refills to her pharmacy. Thx

## 2021-06-06 NOTE — Telephone Encounter (Signed)
Thanks [employer].Chart reviewed. Let's see what her iron indices show and if low would recommend IV iron. She will need close monitoring of her Hgb closely. This is likely from [place] given recent endoscopic evaluation. I would resume PPI for now. She should hold her diuretic for now and contact her PCP about her renal function if they want to follow up her BMET in a few days. If any overt bleeding she needs to let us know. Let me know results of iron studies and we can discuss plan.

## 2021-06-06 NOTE — Telephone Encounter (Signed)
Dr. Havery Moros, pls review office visit note today. I received a call from the lab. Her Hg level dropped to 7.7. Please let me know if you want her to have a blood transfusion. No overt GI bleeding.  INR 1.4. Iron panel pending. Creatinine elevated 1.45. She was hemodynamically stable at the time of her office appointment today.

## 2021-06-06 NOTE — Telephone Encounter (Signed)
Rx has been sent to verified pharmacy. Spoke with patient and made her aware. Patient verbalized understanding,nothing further needed at this time.

## 2021-06-06 NOTE — Progress Notes (Signed)
Agree with assessment as outlined. Patient denies overt bleeding but has recurrent anemia with Hgb in the 7s. Awaiting iron studies. If she has iron deficiency anemia will recommend IV iron. If any recurrence of overt bleeding or progressive anemia will need repeat EGD at the hospital. She will need to have CBC closely monitored and would repeat Hgb at end of the week to make sure stable. She may need RBC transfusion if this trends down further or she has persistent symptoms from the anemia. 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 Colleen please let me know results of iron studies so we can discuss her plan.

## 2021-06-07 ENCOUNTER — Other Ambulatory Visit: Payer: Self-pay | Admitting: *Deleted

## 2021-06-07 DIAGNOSIS — D649 Anemia, unspecified: Secondary | ICD-10-CM

## 2021-06-07 MED ORDER — FERROUS SULFATE 325 (65 FE) MG PO TABS: 325.0000 mg | ORAL_TABLET | Freq: Two times a day (BID) | ORAL | Status: DC

## 2021-06-07 NOTE — Progress Notes (Signed)
Rx sent to pharmacy. Patient is aware, also aware that IV iron is to be scheduled as well. Patient aware and acknowledges orders. Will appt is schedule

## 2021-06-08 ENCOUNTER — Other Ambulatory Visit: Payer: Self-pay | Admitting: *Deleted

## 2021-06-08 LAB — IRON,TIBC AND FERRITIN PANEL
%SAT: 6 % (calc) — ABNORMAL LOW (ref 16–45)
Ferritin: 11 ng/mL — ABNORMAL LOW (ref 16–288)
Iron: 25 ug/dL — ABNORMAL LOW (ref 45–160)
TIBC: 419 mcg/dL (calc) (ref 250–450)

## 2021-06-08 LAB — ANTI-MICROSOMAL ANTIBODY LIVER / KIDNEY: LKM1 Ab: <=20 U (ref <=20.0)

## 2021-06-08 LAB — IGG: IgG (Immunoglobin G), Serum: 2125 mg/dL — ABNORMAL HIGH (ref 600–1540)

## 2021-06-09 ENCOUNTER — Telehealth: Payer: Self-pay | Admitting: Nurse Practitioner

## 2021-06-09 DIAGNOSIS — K7460 Unspecified cirrhosis of liver: Secondary | ICD-10-CM

## 2021-06-09 DIAGNOSIS — D649 Anemia, unspecified: Secondary | ICD-10-CM

## 2021-06-09 NOTE — Telephone Encounter (Signed)
Ann Mcconnell,  refer to labs collected 06/06/2021 and lab notes dated 06/07/2021 with my prior message as follows:    "Contact patient and son Ann Mcconnell with lab results, inform patient iron level is low and IV iron ordered and repeat H/H due on Thurs 9/1.    Send her to the lab on Thurs 9/1 am for a repeat H/H (must be done prior to the holiday weekend".   Ann Mcconnell, pls contact patient and son early Fri 06/10/2021 am for a stat Hg/Hct level since this was not done today. It would be best to have this result back asap prior to the holiday weekend.   THX

## 2021-06-10 ENCOUNTER — Other Ambulatory Visit (INDEPENDENT_AMBULATORY_CARE_PROVIDER_SITE_OTHER): Payer: Medicare Other

## 2021-06-10 ENCOUNTER — Other Ambulatory Visit: Payer: Self-pay

## 2021-06-10 ENCOUNTER — Other Ambulatory Visit: Payer: Self-pay | Admitting: Nurse Practitioner

## 2021-06-10 ENCOUNTER — Telehealth: Payer: Self-pay | Admitting: Nurse Practitioner

## 2021-06-10 ENCOUNTER — Telehealth: Payer: Self-pay | Admitting: Pharmacy Technician

## 2021-06-10 DIAGNOSIS — K7460 Unspecified cirrhosis of liver: Secondary | ICD-10-CM | POA: Diagnosis not present

## 2021-06-10 DIAGNOSIS — D649 Anemia, unspecified: Secondary | ICD-10-CM

## 2021-06-10 LAB — HEMOGLOBIN AND HEMATOCRIT, BLOOD
HCT: 23.2 % — CL (ref 36.0–46.0)
Hemoglobin: 7.5 g/dL — CL (ref 12.0–15.0)

## 2021-06-10 MED ORDER — FERROUS SULFATE 325 (65 FE) MG PO TABS: 325.0000 mg | ORAL_TABLET | Freq: Two times a day (BID) | ORAL | Status: DC

## 2021-06-10 NOTE — Telephone Encounter (Signed)
Spoke with patient, made her aware she needs to in office to have lab work drawn. Patient verbalized understanding and said she be here today.

## 2021-06-10 NOTE — Telephone Encounter (Signed)
Pt medication has been sent to pharmacy Called patient. Advised patient of provider's approval for requested procedure, as well as any comments/instructions from provider.   Provided patient w/ verbal instructions concerning pre-, intra- and post-procedure preparation and instructions.  Patient verbalized understanding of the above.   Patient has also been mailed a letter containing the following instructions  Aware. Marland Kitchen

## 2021-06-10 NOTE — Telephone Encounter (Signed)
Ann Mcconnell, I called the patient' son Ann Mcconnell, I discussed mom's Hg down to 7.7 down to 7.5 today. She did not receive the prescription for Ferrous Sulfate.   Please send RX for Ferrous Sulfate '325mg'$  one tab po bid # 60, 2 refills to the [facility] on [address]. Patient to start asap.  Thank you.   Order for IV iron submitted, waiting for Josem Kaufmann  Son again informed to to take his mother to the ED if she develops any chest pain, palpitations, dizziness, shortness of breath or profound fatigue.  Dr. Havery Moros, Juluis Rainier

## 2021-06-10 NOTE — Telephone Encounter (Signed)
Dr. Archie Patten Submission: NO AUTH REQUIRED Payer: UHC/MEDICARE Medication & CPT/J Code(s) submitted: VENOFER PO:[id number] Route of submission (phone, fax, portal): PHONE Auth type: Buy/Bill Units/visits requested: 5 Reference number: [id number] AraL.

## 2021-06-14 ENCOUNTER — Other Ambulatory Visit: Payer: Self-pay | Admitting: *Deleted

## 2021-06-14 DIAGNOSIS — D649 Anemia, unspecified: Secondary | ICD-10-CM

## 2021-06-14 DIAGNOSIS — K7460 Unspecified cirrhosis of liver: Secondary | ICD-10-CM

## 2021-06-14 NOTE — Telephone Encounter (Signed)
Noted  

## 2021-06-15 ENCOUNTER — Telehealth: Payer: Self-pay | Admitting: Nurse Practitioner

## 2021-06-17 ENCOUNTER — Other Ambulatory Visit (INDEPENDENT_AMBULATORY_CARE_PROVIDER_SITE_OTHER): Payer: Medicare Other

## 2021-06-17 DIAGNOSIS — K7460 Unspecified cirrhosis of liver: Secondary | ICD-10-CM

## 2021-06-17 DIAGNOSIS — D649 Anemia, unspecified: Secondary | ICD-10-CM

## 2021-06-17 LAB — HEMOGLOBIN AND HEMATOCRIT, BLOOD
HCT: 25.9 % — ABNORMAL LOW (ref 36.0–46.0)
Hemoglobin: 8.2 g/dL — ABNORMAL LOW (ref 12.0–15.0)

## 2021-06-19 NOTE — Telephone Encounter (Signed)
Entry error

## 2021-06-29 ENCOUNTER — Ambulatory Visit (INDEPENDENT_AMBULATORY_CARE_PROVIDER_SITE_OTHER): Payer: Medicare Other

## 2021-06-29 ENCOUNTER — Other Ambulatory Visit: Payer: Self-pay

## 2021-06-29 VITALS — BP 117/72 | HR 54 | Temp 98.4°F | Resp 18 | Wt 218.4 lb

## 2021-06-29 DIAGNOSIS — K7460 Unspecified cirrhosis of liver: Secondary | ICD-10-CM | POA: Diagnosis not present

## 2021-06-29 DIAGNOSIS — K922 Gastrointestinal hemorrhage, unspecified: Secondary | ICD-10-CM

## 2021-06-29 DIAGNOSIS — K31819 Angiodysplasia of stomach and duodenum without bleeding: Secondary | ICD-10-CM

## 2021-06-29 MED ORDER — ALTEPLASE 2 MG IJ SOLR: 2.0000 mg | Freq: Once | INTRAMUSCULAR | Status: DC | PRN

## 2021-06-29 MED ORDER — SODIUM CHLORIDE 0.9% FLUSH: 3.0000 mL | Freq: Once | INTRAVENOUS | Status: DC | PRN

## 2021-06-29 MED ORDER — METHYLPREDNISOLONE SODIUM SUCC 125 MG IJ SOLR: 125.0000 mg | Freq: Once | INTRAMUSCULAR | Status: DC | PRN

## 2021-06-29 MED ORDER — ACETAMINOPHEN 325 MG PO TABS
650.0000 mg | ORAL_TABLET | Freq: Once | ORAL | Status: AC
  Administered 2021-06-29: 650 mg via ORAL
  Filled 2021-06-29: qty 2

## 2021-06-29 MED ORDER — HEPARIN SOD (PORK) LOCK FLUSH 100 UNIT/ML IV SOLN: 500.0000 [IU] | Freq: Once | INTRAVENOUS | Status: DC | PRN

## 2021-06-29 MED ORDER — SODIUM CHLORIDE 0.9% FLUSH: 10.0000 mL | Freq: Once | INTRAVENOUS | Status: DC | PRN

## 2021-06-29 MED ORDER — DIPHENHYDRAMINE HCL 50 MG/ML IJ SOLN: 50.0000 mg | Freq: Once | INTRAMUSCULAR | Status: DC | PRN

## 2021-06-29 MED ORDER — SODIUM CHLORIDE 0.9 % IV SOLN
200.0000 mg | Freq: Once | INTRAVENOUS | Status: AC
  Administered 2021-06-29: 200 mg via INTRAVENOUS
  Filled 2021-06-29: qty 10

## 2021-06-29 MED ORDER — HEPARIN SOD (PORK) LOCK FLUSH 100 UNIT/ML IV SOLN: 250.0000 [IU] | Freq: Once | INTRAVENOUS | Status: DC | PRN

## 2021-06-29 MED ORDER — DIPHENHYDRAMINE HCL 25 MG PO CAPS
50.0000 mg | ORAL_CAPSULE | Freq: Once | ORAL | Status: AC
  Administered 2021-06-29: 50 mg via ORAL
  Filled 2021-06-29: qty 2

## 2021-06-29 MED ORDER — ANTICOAGULANT SODIUM CITRATE 4% (200MG/5ML) IV SOLN
5.0000 mL | Freq: Once | Status: DC | PRN
Start: 2021-06-29 — End: 2021-06-29

## 2021-06-29 MED ORDER — ALBUTEROL SULFATE HFA 108 (90 BASE) MCG/ACT IN AERS: 2.0000 | INHALATION_SPRAY | Freq: Once | RESPIRATORY_TRACT | Status: DC | PRN

## 2021-06-29 MED ORDER — SODIUM CHLORIDE 0.9 % IV SOLN: Freq: Once | INTRAVENOUS | Status: DC | PRN

## 2021-06-29 MED ORDER — FAMOTIDINE IN NACL 20-0.9 MG/50ML-% IV SOLN: 20.0000 mg | Freq: Once | INTRAVENOUS | Status: DC | PRN

## 2021-06-29 MED ORDER — EPINEPHRINE 0.3 MG/0.3ML IJ SOAJ: 0.3000 mg | Freq: Once | INTRAMUSCULAR | Status: DC | PRN

## 2021-06-29 NOTE — Progress Notes (Signed)
Diagnosis: Iron Deficiency Anemia  Provider:  Marshell Garfinkel, MD  Procedure: Infusion  IV Type: Peripheral, IV Location: L Hand  Venofer (Iron Sucrose), Dose: 200 mg  Infusion Start Time: [id number]  Infusion Stop Time: 1215  Post Infusion IV Care: Observation period completed and Peripheral IV Discontinued  Discharge: Condition: Good, Destination: Home . AVS provided to patient.   Performed by:  Charlie Pitter, RN

## 2021-07-01 ENCOUNTER — Other Ambulatory Visit: Payer: Self-pay

## 2021-07-01 ENCOUNTER — Ambulatory Visit (INDEPENDENT_AMBULATORY_CARE_PROVIDER_SITE_OTHER): Payer: Medicare Other

## 2021-07-01 VITALS — BP 117/74 | HR 54 | Temp 98.8°F | Resp 18 | Ht 66.0 in | Wt 219.4 lb

## 2021-07-01 DIAGNOSIS — K31819 Angiodysplasia of stomach and duodenum without bleeding: Secondary | ICD-10-CM | POA: Diagnosis not present

## 2021-07-01 DIAGNOSIS — K7460 Unspecified cirrhosis of liver: Secondary | ICD-10-CM | POA: Diagnosis not present

## 2021-07-01 DIAGNOSIS — K922 Gastrointestinal hemorrhage, unspecified: Secondary | ICD-10-CM | POA: Diagnosis not present

## 2021-07-01 MED ORDER — ALTEPLASE 2 MG IJ SOLR: 2.0000 mg | Freq: Once | INTRAMUSCULAR | Status: DC | PRN

## 2021-07-01 MED ORDER — HEPARIN SOD (PORK) LOCK FLUSH 100 UNIT/ML IV SOLN: 250.0000 [IU] | Freq: Once | INTRAVENOUS | Status: DC | PRN

## 2021-07-01 MED ORDER — METHYLPREDNISOLONE SODIUM SUCC 125 MG IJ SOLR: 125.0000 mg | Freq: Once | INTRAMUSCULAR | Status: DC | PRN

## 2021-07-01 MED ORDER — HEPARIN SOD (PORK) LOCK FLUSH 100 UNIT/ML IV SOLN: 500.0000 [IU] | Freq: Once | INTRAVENOUS | Status: DC | PRN

## 2021-07-01 MED ORDER — EPINEPHRINE 0.3 MG/0.3ML IJ SOAJ: 0.3000 mg | Freq: Once | INTRAMUSCULAR | Status: DC | PRN

## 2021-07-01 MED ORDER — ACETAMINOPHEN 325 MG PO TABS
650.0000 mg | ORAL_TABLET | Freq: Once | ORAL | Status: AC
  Administered 2021-07-01: 325 mg via ORAL

## 2021-07-01 MED ORDER — ANTICOAGULANT SODIUM CITRATE 4% (200MG/5ML) IV SOLN
5.0000 mL | Freq: Once | Status: DC | PRN
  Filled 2021-07-01: qty 5

## 2021-07-01 MED ORDER — SODIUM CHLORIDE 0.9% FLUSH: 10.0000 mL | Freq: Once | INTRAVENOUS | Status: DC | PRN

## 2021-07-01 MED ORDER — ALBUTEROL SULFATE HFA 108 (90 BASE) MCG/ACT IN AERS: 2.0000 | INHALATION_SPRAY | Freq: Once | RESPIRATORY_TRACT | Status: DC | PRN

## 2021-07-01 MED ORDER — SODIUM CHLORIDE 0.9 % IV SOLN
200.0000 mg | Freq: Once | INTRAVENOUS | Status: AC
  Administered 2021-07-01: 200 mg via INTRAVENOUS
  Filled 2021-07-01: qty 10

## 2021-07-01 MED ORDER — FAMOTIDINE IN NACL 20-0.9 MG/50ML-% IV SOLN: 20.0000 mg | Freq: Once | INTRAVENOUS | Status: DC | PRN

## 2021-07-01 MED ORDER — SODIUM CHLORIDE 0.9% FLUSH: 3.0000 mL | Freq: Once | INTRAVENOUS | Status: DC | PRN

## 2021-07-01 MED ORDER — DIPHENHYDRAMINE HCL 25 MG PO CAPS
50.0000 mg | ORAL_CAPSULE | Freq: Once | ORAL | Status: AC
  Administered 2021-07-01: 25 mg via ORAL

## 2021-07-01 MED ORDER — SODIUM CHLORIDE 0.9 % IV SOLN: Freq: Once | INTRAVENOUS | Status: DC | PRN

## 2021-07-01 MED ORDER — DIPHENHYDRAMINE HCL 50 MG/ML IJ SOLN: 50.0000 mg | Freq: Once | INTRAMUSCULAR | Status: DC | PRN

## 2021-07-01 NOTE — Progress Notes (Signed)
Diagnosis: Iron Deficiency Anemia  Provider:  Marshell Garfinkel, MD  Procedure: Infusion  IV Type: Peripheral, IV Location: R Hand  Venofer (Iron Sucrose), Dose: 200 mg  Infusion Start Time: [id number]  Infusion Stop Time: [id number]  Post Infusion IV Care: Peripheral IV Discontinued  Discharge: Condition: Good, Destination: Home . AVS provided to patient.   Performed by:  Shereena Berquist, Sherlon Handing, LPN

## 2021-07-04 ENCOUNTER — Other Ambulatory Visit: Payer: Self-pay

## 2021-07-04 ENCOUNTER — Ambulatory Visit (INDEPENDENT_AMBULATORY_CARE_PROVIDER_SITE_OTHER): Payer: Medicare Other | Admitting: *Deleted

## 2021-07-04 VITALS — BP 123/73 | HR 55 | Temp 98.2°F | Resp 18 | Ht 66.0 in | Wt 222.2 lb

## 2021-07-04 DIAGNOSIS — K922 Gastrointestinal hemorrhage, unspecified: Secondary | ICD-10-CM | POA: Diagnosis not present

## 2021-07-04 DIAGNOSIS — K7460 Unspecified cirrhosis of liver: Secondary | ICD-10-CM

## 2021-07-04 DIAGNOSIS — K31819 Angiodysplasia of stomach and duodenum without bleeding: Secondary | ICD-10-CM

## 2021-07-04 MED ORDER — ACETAMINOPHEN 325 MG PO TABS
650.0000 mg | ORAL_TABLET | Freq: Once | ORAL | Status: AC
  Administered 2021-07-04: 325 mg via ORAL
  Filled 2021-07-04: qty 2

## 2021-07-04 MED ORDER — FAMOTIDINE IN NACL 20-0.9 MG/50ML-% IV SOLN: 20.0000 mg | Freq: Once | INTRAVENOUS | Status: DC | PRN

## 2021-07-04 MED ORDER — SODIUM CHLORIDE 0.9% FLUSH: 3.0000 mL | Freq: Once | INTRAVENOUS | Status: DC | PRN

## 2021-07-04 MED ORDER — SODIUM CHLORIDE 0.9% FLUSH: 10.0000 mL | Freq: Once | INTRAVENOUS | Status: DC | PRN

## 2021-07-04 MED ORDER — HEPARIN SOD (PORK) LOCK FLUSH 100 UNIT/ML IV SOLN: 500.0000 [IU] | Freq: Once | INTRAVENOUS | Status: DC | PRN

## 2021-07-04 MED ORDER — SODIUM CHLORIDE 0.9 % IV SOLN
200.0000 mg | Freq: Once | INTRAVENOUS | Status: AC
  Administered 2021-07-04: 200 mg via INTRAVENOUS
  Filled 2021-07-04: qty 10

## 2021-07-04 MED ORDER — METHYLPREDNISOLONE SODIUM SUCC 125 MG IJ SOLR: 125.0000 mg | Freq: Once | INTRAMUSCULAR | Status: DC | PRN

## 2021-07-04 MED ORDER — ANTICOAGULANT SODIUM CITRATE 4% (200MG/5ML) IV SOLN
5.0000 mL | Freq: Once | Status: DC | PRN
  Filled 2021-07-04: qty 5

## 2021-07-04 MED ORDER — DIPHENHYDRAMINE HCL 50 MG/ML IJ SOLN: 50.0000 mg | Freq: Once | INTRAMUSCULAR | Status: DC | PRN

## 2021-07-04 MED ORDER — ALTEPLASE 2 MG IJ SOLR: 2.0000 mg | Freq: Once | INTRAMUSCULAR | Status: DC | PRN

## 2021-07-04 MED ORDER — DIPHENHYDRAMINE HCL 25 MG PO CAPS
50.0000 mg | ORAL_CAPSULE | Freq: Once | ORAL | Status: AC
  Administered 2021-07-04: 25 mg via ORAL
  Filled 2021-07-04: qty 2

## 2021-07-04 MED ORDER — SODIUM CHLORIDE 0.9 % IV SOLN: Freq: Once | INTRAVENOUS | Status: DC | PRN

## 2021-07-04 MED ORDER — EPINEPHRINE 0.3 MG/0.3ML IJ SOAJ: 0.3000 mg | Freq: Once | INTRAMUSCULAR | Status: DC | PRN

## 2021-07-04 MED ORDER — HEPARIN SOD (PORK) LOCK FLUSH 100 UNIT/ML IV SOLN: 250.0000 [IU] | Freq: Once | INTRAVENOUS | Status: DC | PRN

## 2021-07-04 MED ORDER — ALBUTEROL SULFATE HFA 108 (90 BASE) MCG/ACT IN AERS: 2.0000 | INHALATION_SPRAY | Freq: Once | RESPIRATORY_TRACT | Status: DC | PRN

## 2021-07-04 NOTE — Progress Notes (Signed)
Diagnosis: Iron Deficiency Anemia  Provider:  Marshell Garfinkel, MD  Procedure: Infusion  IV Type: Peripheral, IV Location: L Forearm  Venofer (Iron Sucrose), Dose: 200 mg  Infusion Start Time: 1136am  Infusion Stop Time: 1155am  Post Infusion IV Care: Observation period completed and Peripheral IV Discontinued  Discharge: Condition: Good, Destination: Home . AVS provided to patient.   Performed by:  Oren Beckmann, RN

## 2021-07-06 ENCOUNTER — Other Ambulatory Visit: Payer: Self-pay

## 2021-07-06 ENCOUNTER — Ambulatory Visit (INDEPENDENT_AMBULATORY_CARE_PROVIDER_SITE_OTHER): Payer: Medicare Other

## 2021-07-06 VITALS — BP 139/79 | HR 104 | Temp 98.4°F | Resp 18 | Ht 66.0 in | Wt 217.2 lb

## 2021-07-06 DIAGNOSIS — K922 Gastrointestinal hemorrhage, unspecified: Secondary | ICD-10-CM | POA: Diagnosis not present

## 2021-07-06 DIAGNOSIS — D500 Iron deficiency anemia secondary to blood loss (chronic): Secondary | ICD-10-CM | POA: Diagnosis not present

## 2021-07-06 DIAGNOSIS — K7460 Unspecified cirrhosis of liver: Secondary | ICD-10-CM | POA: Diagnosis not present

## 2021-07-06 DIAGNOSIS — K31819 Angiodysplasia of stomach and duodenum without bleeding: Secondary | ICD-10-CM | POA: Diagnosis not present

## 2021-07-06 MED ORDER — SODIUM CHLORIDE 0.9 % IV SOLN
200.0000 mg | Freq: Once | INTRAVENOUS | Status: AC
  Administered 2021-07-06: 200 mg via INTRAVENOUS
  Filled 2021-07-06: qty 10

## 2021-07-06 MED ORDER — HEPARIN SOD (PORK) LOCK FLUSH 100 UNIT/ML IV SOLN: 250.0000 [IU] | Freq: Once | INTRAVENOUS | Status: DC | PRN

## 2021-07-06 MED ORDER — HEPARIN SOD (PORK) LOCK FLUSH 100 UNIT/ML IV SOLN: 500.0000 [IU] | Freq: Once | INTRAVENOUS | Status: DC | PRN

## 2021-07-06 MED ORDER — ACETAMINOPHEN 325 MG PO TABS
650.0000 mg | ORAL_TABLET | Freq: Once | ORAL | Status: AC
  Administered 2021-07-06: 325 mg via ORAL

## 2021-07-06 MED ORDER — METHYLPREDNISOLONE SODIUM SUCC 125 MG IJ SOLR: 125.0000 mg | Freq: Once | INTRAMUSCULAR | Status: DC | PRN

## 2021-07-06 MED ORDER — ALBUTEROL SULFATE HFA 108 (90 BASE) MCG/ACT IN AERS: 2.0000 | INHALATION_SPRAY | Freq: Once | RESPIRATORY_TRACT | Status: DC | PRN

## 2021-07-06 MED ORDER — SODIUM CHLORIDE 0.9% FLUSH
10.0000 mL | Freq: Once | INTRAVENOUS | Status: DC | PRN
Start: 2021-07-06 — End: 2021-07-06

## 2021-07-06 MED ORDER — SODIUM CHLORIDE 0.9% FLUSH
3.0000 mL | Freq: Once | INTRAVENOUS | Status: DC | PRN
Start: 2021-07-06 — End: 2021-07-06

## 2021-07-06 MED ORDER — FAMOTIDINE IN NACL 20-0.9 MG/50ML-% IV SOLN: 20.0000 mg | Freq: Once | INTRAVENOUS | Status: DC | PRN

## 2021-07-06 MED ORDER — ALTEPLASE 2 MG IJ SOLR
2.0000 mg | Freq: Once | INTRAMUSCULAR | Status: DC | PRN
Start: 2021-07-06 — End: 2021-07-06

## 2021-07-06 MED ORDER — EPINEPHRINE 0.3 MG/0.3ML IJ SOAJ: 0.3000 mg | Freq: Once | INTRAMUSCULAR | Status: DC | PRN

## 2021-07-06 MED ORDER — SODIUM CHLORIDE 0.9 % IV SOLN: Freq: Once | INTRAVENOUS | Status: DC | PRN

## 2021-07-06 MED ORDER — DIPHENHYDRAMINE HCL 25 MG PO CAPS
50.0000 mg | ORAL_CAPSULE | Freq: Once | ORAL | Status: AC
  Administered 2021-07-06: 25 mg via ORAL

## 2021-07-06 MED ORDER — ANTICOAGULANT SODIUM CITRATE 4% (200MG/5ML) IV SOLN
5.0000 mL | Freq: Once | Status: DC | PRN
Start: 2021-07-06 — End: 2021-07-06
  Filled 2021-07-06: qty 5

## 2021-07-06 MED ORDER — DIPHENHYDRAMINE HCL 50 MG/ML IJ SOLN: 50.0000 mg | Freq: Once | INTRAMUSCULAR | Status: DC | PRN

## 2021-07-06 NOTE — Progress Notes (Signed)
Diagnosis: Iron Deficiency Anemia  Provider:  Marshell Garfinkel, MD  Procedure: Infusion  IV Type: Peripheral, IV Location: R Hand  Venofer (Iron Sucrose), Dose: 200 mg  Infusion Start Time: [id number]  Infusion Stop Time: 1226  Post Infusion IV Care: Peripheral IV Discontinued  Discharge: Condition: Good, Destination: Home . AVS provided to patient.   Performed by:  Charlie Pitter, RN

## 2021-07-08 ENCOUNTER — Other Ambulatory Visit: Payer: Self-pay

## 2021-07-08 ENCOUNTER — Ambulatory Visit (INDEPENDENT_AMBULATORY_CARE_PROVIDER_SITE_OTHER): Payer: Medicare Other

## 2021-07-08 VITALS — BP 145/82 | HR 65 | Temp 97.8°F | Resp 16 | Ht 66.0 in | Wt 223.4 lb

## 2021-07-08 DIAGNOSIS — D500 Iron deficiency anemia secondary to blood loss (chronic): Secondary | ICD-10-CM

## 2021-07-08 DIAGNOSIS — K922 Gastrointestinal hemorrhage, unspecified: Secondary | ICD-10-CM

## 2021-07-08 DIAGNOSIS — K7460 Unspecified cirrhosis of liver: Secondary | ICD-10-CM

## 2021-07-08 DIAGNOSIS — K31819 Angiodysplasia of stomach and duodenum without bleeding: Secondary | ICD-10-CM

## 2021-07-08 MED ORDER — EPINEPHRINE 0.3 MG/0.3ML IJ SOAJ: 0.3000 mg | Freq: Once | INTRAMUSCULAR | Status: DC | PRN

## 2021-07-08 MED ORDER — DIPHENHYDRAMINE HCL 25 MG PO CAPS
50.0000 mg | ORAL_CAPSULE | Freq: Once | ORAL | Status: AC
  Administered 2021-07-08: 25 mg via ORAL
  Filled 2021-07-08: qty 2

## 2021-07-08 MED ORDER — FAMOTIDINE IN NACL 20-0.9 MG/50ML-% IV SOLN: 20.0000 mg | Freq: Once | INTRAVENOUS | Status: DC | PRN

## 2021-07-08 MED ORDER — METHYLPREDNISOLONE SODIUM SUCC 125 MG IJ SOLR: 125.0000 mg | Freq: Once | INTRAMUSCULAR | Status: DC | PRN

## 2021-07-08 MED ORDER — ALBUTEROL SULFATE HFA 108 (90 BASE) MCG/ACT IN AERS: 2.0000 | INHALATION_SPRAY | Freq: Once | RESPIRATORY_TRACT | Status: DC | PRN

## 2021-07-08 MED ORDER — DIPHENHYDRAMINE HCL 50 MG/ML IJ SOLN: 50.0000 mg | Freq: Once | INTRAMUSCULAR | Status: DC | PRN

## 2021-07-08 MED ORDER — ACETAMINOPHEN 325 MG PO TABS
650.0000 mg | ORAL_TABLET | Freq: Once | ORAL | Status: AC
  Administered 2021-07-08: 650 mg via ORAL
  Filled 2021-07-08: qty 2

## 2021-07-08 MED ORDER — SODIUM CHLORIDE 0.9 % IV SOLN
200.0000 mg | Freq: Once | INTRAVENOUS | Status: AC
  Administered 2021-07-08: 200 mg via INTRAVENOUS
  Filled 2021-07-08: qty 10

## 2021-07-08 MED ORDER — SODIUM CHLORIDE 0.9 % IV SOLN: Freq: Once | INTRAVENOUS | Status: DC | PRN

## 2021-07-08 NOTE — Progress Notes (Signed)
     Diagnosis: Iron Deficiency Anemia  Provider:  Marshell Garfinkel, MD  Procedure: Infusion  IV Type: Peripheral, IV Location: R Hand  Venofer (Iron Sucrose), Dose: 200 mg  Infusion Start Time: 11.46 07/08/2021  Infusion Stop Time: 12.10 07/08/2021  Post Infusion IV Care: Observation period completed and Peripheral IV Discontinued  Discharge: Condition: Good, Destination: Home . AVS provided to patient.   Performed by:  Arnoldo Morale, RN

## 2021-07-28 ENCOUNTER — Encounter: Payer: Self-pay | Admitting: Gastroenterology

## 2021-07-28 ENCOUNTER — Ambulatory Visit (INDEPENDENT_AMBULATORY_CARE_PROVIDER_SITE_OTHER): Payer: Medicare Other | Admitting: Gastroenterology

## 2021-07-28 ENCOUNTER — Other Ambulatory Visit (INDEPENDENT_AMBULATORY_CARE_PROVIDER_SITE_OTHER): Payer: Medicare Other

## 2021-07-28 VITALS — BP 110/50 | HR 64 | Ht 65.0 in | Wt 223.1 lb

## 2021-07-28 DIAGNOSIS — K31819 Angiodysplasia of stomach and duodenum without bleeding: Secondary | ICD-10-CM

## 2021-07-28 DIAGNOSIS — K7460 Unspecified cirrhosis of liver: Secondary | ICD-10-CM

## 2021-07-28 DIAGNOSIS — Z23 Encounter for immunization: Secondary | ICD-10-CM | POA: Diagnosis not present

## 2021-07-28 DIAGNOSIS — D509 Iron deficiency anemia, unspecified: Secondary | ICD-10-CM | POA: Diagnosis not present

## 2021-07-28 LAB — CBC WITH DIFFERENTIAL/PLATELET
Basophils Absolute: 0.1 10*3/uL (ref 0.0–0.1)
Basophils Relative: 0.9 % (ref 0.0–3.0)
Eosinophils Absolute: 0.3 10*3/uL (ref 0.0–0.7)
Eosinophils Relative: 4.3 % (ref 0.0–5.0)
HCT: 33.3 % — ABNORMAL LOW (ref 36.0–46.0)
Hemoglobin: 11.1 g/dL — ABNORMAL LOW (ref 12.0–15.0)
Lymphocytes Relative: 32.6 % (ref 12.0–46.0)
Lymphs Abs: 2.6 10*3/uL (ref 0.7–4.0)
MCHC: 33.2 g/dL (ref 30.0–36.0)
MCV: 102.5 fl — ABNORMAL HIGH (ref 78.0–100.0)
Monocytes Absolute: 0.5 10*3/uL (ref 0.1–1.0)
Monocytes Relative: 6.8 % (ref 3.0–12.0)
Neutro Abs: 4.4 10*3/uL (ref 1.4–7.7)
Neutrophils Relative %: 55.4 % (ref 43.0–77.0)
Platelets: 125 10*3/uL — ABNORMAL LOW (ref 150.0–400.0)
RBC: 3.25 Mil/uL — ABNORMAL LOW (ref 3.87–5.11)
RDW: 22.3 % — ABNORMAL HIGH (ref 11.5–15.5)
WBC: 7.9 10*3/uL (ref 4.0–10.5)

## 2021-07-28 LAB — COMPREHENSIVE METABOLIC PANEL
ALT: 37 U/L — ABNORMAL HIGH (ref 0–35)
AST: 74 U/L — ABNORMAL HIGH (ref 0–37)
Albumin: 3.1 g/dL — ABNORMAL LOW (ref 3.5–5.2)
Alkaline Phosphatase: 147 U/L — ABNORMAL HIGH (ref 39–117)
BUN: 20 mg/dL (ref 6–23)
CO2: 25 mEq/L (ref 19–32)
Calcium: 9.3 mg/dL (ref 8.4–10.5)
Chloride: 104 mEq/L (ref 96–112)
Creatinine, Ser: 1.35 mg/dL — ABNORMAL HIGH (ref 0.40–1.20)
GFR: 38.47 mL/min — ABNORMAL LOW (ref >60.00)
Glucose, Bld: 128 mg/dL — ABNORMAL HIGH (ref 70–99)
Potassium: 4.5 mEq/L (ref 3.5–5.1)
Sodium: 137 mEq/L (ref 135–145)
Total Bilirubin: 3.1 mg/dL — ABNORMAL HIGH (ref 0.2–1.2)
Total Protein: 7.3 g/dL (ref 6.0–8.3)

## 2021-07-28 LAB — PROTIME-INR
INR: 1.5 ratio — ABNORMAL HIGH (ref 0.8–1.0)
Prothrombin Time: 16.3 s — ABNORMAL HIGH (ref 9.6–13.1)

## 2021-07-28 MED ORDER — FERROUS SULFATE 325 (65 FE) MG PO TABS: 325.0000 mg | ORAL_TABLET | Freq: Every day | ORAL | 1 refills | Status: DC

## 2021-07-28 NOTE — Progress Notes (Signed)
HPI :  75 year old female here for reassessment of cirrhosis, gave, iron deficiency anemia.  Recall I met her when she was admitted to the hospital in July for symptomatic anemia.  She had a severe iron deficiency anemia with a hemoglobin in the 6s, FOBT positive.  She had imaging that was suggestive of cirrhosis of the liver at that time which is a new diagnosis for her.  She underwent an EGD and colonoscopy by Dr. Silverio Decamp on 05/02/2021. 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 The colonoscopy showed internal and external hemorrhoids otherwise was negative.   Her hemoglobin had risen to eights upon discharge, however post discharge hemoglobin had dropped back to sevens in early September along with persistent iron deficiency.  She was referred for IV iron.  In the interim she was placed on oral iron and her hemoglobin improved to 8.2 on September 9.   She states in general she has not seen any blood in her stool.  She takes an iron tablet daily and her stools are a bit on the darker side but no overt bleeding she is aware of.  She has received a total of 5 IV iron infusions over the past few months.  She states that generally she is doing better than she was in the hospital, some good days, some bad days.  She does have some swelling in her feet but otherwise denies swelling in her legs or abdomen.  No history of jaundice.  She had no varices on her EGD.  Her brother is with her today and states she had 1 day where she was slightly confused after her hospitalization but otherwise no symptoms concerning for encephalopathy otherwise.  She is a bit frail and her gait is slow, she is being seen by neurology next week.  She historically denies any long-term chronic alcohol use.  After her divorce of her first husband she states she had 2 drinks per night for a few months but other than that does not  drink alcohol routinely.  She denies any family history of liver disease.  She is not using any NSAIDs.  She remotely does have a history of fatty liver on prior imaging but no known cirrhosis prior to the hospitalization.  She had a serologic work-up for chronic liver diseases which did not show any clear cause otherwise.  She was nonimmune to hepatitis B and is due for her next hep B shot today.  She is immune to hep A  Prior workup: EGD 05/02/2021: - Z-line regular, 36 cm from the incisors. - No gross lesions in esophagus. - Gastric antral vascular ectasia. Treated with argon plasma coagulation (APC). Likely etiology for GI blood loss and anemia - Normal examined duodenum. - No specimens collected.   Colonoscopy 05/02/2021: - Non-bleeding external and internal hemorrhoids. - The examination was otherwise normal. - No specimens collected. - No repeat colonoscopy due to age    Abdominal sonogram 05/01/2021: 1. No acute finding. 2. Lobulated liver surface that is suspicious for cirrhosis. 3. Bowel gas obscures the aorta and pancreas. 4. Spleen: 350 cc volume, upper normal.  No focal abnormality.     06/06/21 - ferritin 121, iron sat 6%, iron 25 Hgb dropped to 7.7 on 8/29 Hgb 7.5 on 06/10/21 Hgb 8.2 on 06/17/21   Past Medical History:  Diagnosis Date   Cirrhosis ([place])    Diabetes mellitus without complication (  [place])    DOE (dyspnea on exertion)    GAVE (gastric antral vascular ectasia)    Gout    HLD (hyperlipidemia)    HTN (hypertension)    Hypothyroidism    Tachycardia      Past Surgical History:  Procedure Laterality Date   Arm Surgery     BREAST REDUCTION SURGERY     CHOLECYSTECTOMY     COLONOSCOPY WITH PROPOFOL N/A 05/02/2021   Procedure: COLONOSCOPY WITH PROPOFOL;  Surgeon: Mauri Pole, MD;  Location: [facility];  Service: Endoscopy;  Laterality: N/A;   ESOPHAGOGASTRODUODENOSCOPY (EGD) WITH PROPOFOL N/A 05/02/2021   Procedure: ESOPHAGOGASTRODUODENOSCOPY (EGD)  WITH PROPOFOL;  Surgeon: Mauri Pole, MD;  Location: [facility] ENDOSCOPY;  Service: Endoscopy;  Laterality: N/A;   HOT HEMOSTASIS N/A 05/02/2021   Procedure: HOT HEMOSTASIS (ARGON PLASMA COAGULATION/BICAP);  Surgeon: Mauri Pole, MD;  Location: [facility];  Service: Endoscopy;  Laterality: N/A;   REDUCTION MAMMAPLASTY     Family History  Problem Relation Age of Onset   Heart attack Other    Lymphoma Other    COPD Other    Thyroid disease Other    Sleep apnea Other    Social History   Tobacco Use   Smoking status: Never   Smokeless tobacco: Never  Substance Use Topics   Alcohol use: No   Drug use: Never   Current Outpatient Medications  Medication Sig Dispense Refill   Accu-Chek FastClix Lancets MISC Apply topically.     ACCU-CHEK GUIDE test strip      allopurinol (ZYLOPRIM) 300 MG tablet Take 300 mg by mouth daily.     amLODipine (NORVASC) 5 MG tablet Take 5 mg by mouth daily.     busPIRone (BUSPAR) 10 MG tablet Take 10 mg by mouth daily.     CALCIUM PO Take 1 Dose by mouth daily.     Cholecalciferol (VITAMIN D3 PO) Take 1 Dose by mouth daily.     citalopram (CELEXA) 40 MG tablet Take 40 mg by mouth daily.     Cyanocobalamin (VITAMIN B-12 PO) Take 1 Dose by mouth daily.     ferrous sulfate 325 (65 FE) MG tablet Take 1 tablet (325 mg total) by mouth daily with breakfast. 30 tablet 1   Glucosamine HCl (GLUCOSAMINE PO) Take 1 Dose by mouth daily.     levothyroxine (SYNTHROID) 100 MCG tablet Take 100 mcg by mouth daily.     lisinopril-hydrochlorothiazide (ZESTORETIC) 20-12.5 MG tablet Take 1 tablet by mouth daily.     metFORMIN (GLUCOPHAGE-XR) 500 MG 24 hr tablet Take 1,000 mg by mouth daily.     metoprolol succinate (TOPROL-XL) 100 MG 24 hr tablet Take 100 mg by mouth daily. Take with or immediately following a meal.     Omega-3 Fatty Acids (FISH OIL PO) Take 1 Dose by mouth daily.     pantoprazole (PROTONIX) 40 MG tablet Take 1 tablet (40 mg total) by mouth in the  morning. 90 tablet 0   pravastatin (PRAVACHOL) 40 MG tablet Take 40 mg by mouth daily.     No current facility-administered medications for this visit.   No Known Allergies   Review of Systems: All systems reviewed and negative except where noted in HPI.   CBC Latest Ref Rng & Units 06/17/2021 06/10/2021 06/06/2021  WBC 4.0 - 10.5 K/uL - - 9.2  Hemoglobin 12.0 - 15.0 g/dL 8.2 Repeated and verified X2.(L) 7.5 cL(LL) 7.7 Repeated and verified X2.(LL)  Hematocrit 36.0 - 46.0 % 25.9  Repeated and verified X2.(L) 23.2 cL(LL) 24.1(L)  Platelets 150.0 - 400.0 K/uL - - 160.0    Lab Results  Component Value Date   CREATININE 1.45 (H) 06/06/2021   BUN 27 (H) 06/06/2021   NA 138 06/06/2021   K 3.9 06/06/2021   CL 106 06/06/2021   CO2 20 06/06/2021    Lab Results  Component Value Date   ALT 23 06/06/2021   AST 41 (H) 06/06/2021   ALKPHOS 111 06/06/2021   BILITOT 1.4 (H) 06/06/2021   Lab Results  Component Value Date   INR 1.4 (H) 06/06/2021   INR 1.7 (H) 05/02/2021   INR 1.7 (H) 05/01/2021     Physical Exam: BP (!) 110/50 (BP Location: Left Arm, Patient Position: Sitting, Cuff Size: Large)   Pulse 64   Ht $R'5\' 5"'PT$  (1.651 m) Comment: height measured without shoes  Wt 223 lb 2 oz (101.2 kg)   BMI 37.13 kg/m  Constitutional: Pleasant,well-developed, female in no acute distress. HEENT: Normocephalic and atraumatic. Conjunctivae are normal. No scleral icterus.  Abdominal: Soft, nondistended, nontender. There are no masses palpable.  Extremities: (+) trace to 1 edema LE Lymphadenopathy: No cervical adenopathy noted. Neurological: Alert and oriented to person place and time. Skin: Skin is warm and dry. No rashes noted. Psychiatric: Normal mood and affect. Behavior is normal.   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  Work-up revealed underlying cirrhosis as the likely cause.  Cirrhosis more than likely due to fatty liver, work-up negative otherwise.  She appears compensated at this time, no varices noted.  Brother questions perhaps some mild symptoms of encephalopathy around her admission but appears clear on exam today.  He will monitor closely for encephalopathy.  She has received multiple doses of IV iron since her hospitalization I will plan on checking her CBC today, hopefully her anemia has improved/resolved.  We will recheck her kidney liver function and iron are as well.  She is due for her next shot of her hep B vaccine series.  She will continue oral iron for now.  We had a lengthy discussion about her cirrhosis in general, risks for decompensation moving forward and risk for [place].  We will plan on repeating another ultrasound in January 2023.  Recommend she get the flu shot if she has not yet had it.  I do think she should talk with her primary care about her medication regimen to include potentially switching amlodipine, lisinopril/hydrochlorothiazide in this patient with cirrhosis.  If she becomes decompensated she will need to stop metformin.  Okay to continue pravastatin.  I will see her again in 3 to 4 months for reassessment or sooner with any questions or concerns.  She agrees with the plan.  Plan: - CBC, CMET, INR today - recall [place] Jan 2023 for [facility] screening - Hep B vaccine today - continue oral iron - f/u 3-4 months - recommend flu shot - patient will talk with PCP about long term regimen of amlodipine, lisinopril-HCTZ and metformin - may want to stop / adjust given chronic liver diease - okay to continue pravastatin  Jolly Mango, MD [facility]

## 2021-07-28 NOTE — Patient Instructions (Addendum)
If you are age 75 or older, your body mass index should be between 23-30. Your Body mass index is 37.13 kg/m. If this is out of the aforementioned range listed, please consider follow up with your Primary Care Provider.  If you are age 38 or younger, your body mass index should be between 19-25. Your Body mass index is 37.13 kg/m. If this is out of the aformentioned range listed, please consider follow up with your Primary Care Provider.   ________________________________________________________  The [place] [facility] providers would like to encourage you to use [facility] to communicate with providers for non-urgent requests or questions.  Due to long hold times on the telephone, sending your provider a message by [facility] may be a faster and more efficient way to get a response.  Please allow 48 business hours for a response.  Please remember that this is for non-urgent requests.  _______________________________________________________  We have given you your 2nd and final Hepatitis B vaccination today along with a Vaccine Information Sheet.  Please talk with your PCP regarding your amlodipine, lisinopril and metformin.  We recommend that you get a flu shot.  Please go to the lab in the basement of our building to have lab work done as you leave today. Hit "B" for basement when you get on the elevator.  When the doors open the lab is on your left.  We will call you with the results. Thank you.  Please purchase the following medications over the counter and take as directed: Ferrous sulfate (iron) 325 mg: Take once daily with breakfast  You will be contacted by [employer] Scheduling to arrange a RUQ Ultrasound in January for [facility] screening.  The number on your caller ID will be [phone], please answer when they call.  If you have not heard from them in 2 days please call [phone] to schedule.    Please follow up in 3 to 4 months.  Thank you for entrusting me with your care and for  choosing [facility], Dr. [place]ellar

## 2021-08-03 DIAGNOSIS — D649 Anemia, unspecified: Secondary | ICD-10-CM | POA: Diagnosis not present

## 2021-08-03 DIAGNOSIS — E039 Hypothyroidism, unspecified: Secondary | ICD-10-CM | POA: Diagnosis not present

## 2021-08-03 DIAGNOSIS — N1831 Chronic kidney disease, stage 3a: Secondary | ICD-10-CM | POA: Diagnosis not present

## 2021-08-03 DIAGNOSIS — E1165 Type 2 diabetes mellitus with hyperglycemia: Secondary | ICD-10-CM | POA: Diagnosis not present

## 2021-08-03 DIAGNOSIS — D500 Iron deficiency anemia secondary to blood loss (chronic): Secondary | ICD-10-CM | POA: Diagnosis not present

## 2021-08-03 DIAGNOSIS — E782 Mixed hyperlipidemia: Secondary | ICD-10-CM | POA: Diagnosis not present

## 2021-08-03 DIAGNOSIS — E1169 Type 2 diabetes mellitus with other specified complication: Secondary | ICD-10-CM | POA: Diagnosis not present

## 2021-08-03 DIAGNOSIS — I10 Essential (primary) hypertension: Secondary | ICD-10-CM | POA: Diagnosis not present

## 2021-08-03 NOTE — Progress Notes (Signed)
NEUROLOGY CONSULTATION NOTE  Ann Mcconnell MRN: [id number] DOB: 16-Sep-1946  Referring provider: Maude Leriche, PA-C Primary care provider: Maury Dus, MD  Reason for consult:  falls  Assessment/Plan:  Unsteady gait/frequent falls - unclear etiology.  Due to inactivity, she may have weakness of her core muscles that is affecting her balance.  No obvious signs and symptoms of neuropathy, lumbar stenosis, myelopathy, or lateralizing symptoms to suggest stroke.  She does have some evidence of bradykinesia as noted in finger-thumb tapping, but no other symptoms to suggest Parkinson's disease (rigidity, tremor, gait not typical shuffling gait).    I would like to image the brain to evaluate for secondary intracranial etiologies, such as normal pressure hydrocephalus.  She would like to avoid an MRI due to claustrophobia.  CT head is fine for reasons that I am investigating.  She would like to hold off until January.  As long as she remains clinically stable, I have no objection.  I would like to order home physical therapy Follow up after CT head.    Subjective:  Ann Mcconnell is a 75 year old female with cirrhosis, DM II, CKD, HTN, HLD, and hypothyroidism who presents for frequent falls.  History supplemented by referring provider's note.  She is accompanied by her brother.  In July, she began having balance problems.  She was admitted to the hospital in July for anemia presenting as fatigue as well as near syncopal event with heart racing.  EGD showed gastric antral vascular ectasia, treated with argon plasma coagulation.    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  Since discharge, she has continued to have balance problems, sometimes causing falls.  She has now required using a walker.  She has been mostly sedentary.  No dizziness, lower extremity weakness or numbness in feet.  She says that her foot gets "hung up".  Since  discharge from the hospital, she also exhibits occasional body twitches lasting a second.    Labs from July include B12 1,334  CK normal TSH 0.578, Mg 2.0.  PAST MEDICAL HISTORY: Past Medical History:  Diagnosis Date   Cirrhosis ([place])    Diabetes mellitus without complication ([place])    DOE (dyspnea on exertion)    GAVE (gastric antral vascular ectasia)    Gout    HLD (hyperlipidemia)    HTN (hypertension)    Hypothyroidism    Tachycardia     PAST SURGICAL HISTORY: Past Surgical History:  Procedure Laterality Date   Arm Surgery     BREAST REDUCTION SURGERY     CHOLECYSTECTOMY     COLONOSCOPY WITH PROPOFOL N/A 05/02/2021   Procedure: COLONOSCOPY WITH PROPOFOL;  Surgeon: Mauri Pole, MD;  Location: [facility];  Service: Endoscopy;  Laterality: N/A;   ESOPHAGOGASTRODUODENOSCOPY (EGD) WITH PROPOFOL N/A 05/02/2021   Procedure: ESOPHAGOGASTRODUODENOSCOPY (EGD) WITH PROPOFOL;  Surgeon: Mauri Pole, MD;  Location: [facility];  Service: Endoscopy;  Laterality: N/A;   HOT HEMOSTASIS N/A 05/02/2021   Procedure: HOT HEMOSTASIS (ARGON PLASMA COAGULATION/BICAP);  Surgeon: Mauri Pole, MD;  Location: [facility];  Service: Endoscopy;  Laterality: N/A;   REDUCTION MAMMAPLASTY      MEDICATIONS: Current Outpatient Medications on File Prior to Visit  Medication Sig Dispense Refill   Accu-Chek FastClix Lancets MISC Apply topically.     ACCU-CHEK GUIDE test strip      allopurinol (ZYLOPRIM) 300 MG tablet Take 300 mg by mouth daily.  amLODipine (NORVASC) 5 MG tablet Take 5 mg by mouth daily.     busPIRone (BUSPAR) 10 MG tablet Take 10 mg by mouth daily.     CALCIUM PO Take 1 Dose by mouth daily.     Cholecalciferol (VITAMIN D3 PO) Take 1 Dose by mouth daily.     citalopram (CELEXA) 40 MG tablet Take 40 mg by mouth daily.     Cyanocobalamin (VITAMIN B-12 PO) Take 1 Dose by mouth daily.     ferrous sulfate 325 (65 FE) MG tablet Take 1 tablet (325 mg total) by mouth  daily with breakfast. 30 tablet 1   Glucosamine HCl (GLUCOSAMINE PO) Take 1 Dose by mouth daily.     levothyroxine (SYNTHROID) 100 MCG tablet Take 100 mcg by mouth daily.     lisinopril-hydrochlorothiazide (ZESTORETIC) 20-12.5 MG tablet Take 1 tablet by mouth daily.     metFORMIN (GLUCOPHAGE-XR) 500 MG 24 hr tablet Take 1,000 mg by mouth daily.     metoprolol succinate (TOPROL-XL) 100 MG 24 hr tablet Take 100 mg by mouth daily. Take with or immediately following a meal.     Omega-3 Fatty Acids (FISH OIL PO) Take 1 Dose by mouth daily.     pantoprazole (PROTONIX) 40 MG tablet Take 1 tablet (40 mg total) by mouth in the morning. 90 tablet 0   pravastatin (PRAVACHOL) 40 MG tablet Take 40 mg by mouth daily.     No current facility-administered medications on file prior to visit.    ALLERGIES: No Known Allergies  FAMILY HISTORY: Family History  Problem Relation Age of Onset   Heart attack Other    Lymphoma Other    COPD Other    Thyroid disease Other    Sleep apnea Other     Objective:  Blood pressure (!) 110/58, pulse (!) 58, height 5\' 5"  (1.651 m), weight 222 lb (100.7 kg), SpO2 98 %. General: No acute distress.  Patient appears well-groomed.   Head:  Normocephalic/atraumatic Eyes:  fundi examined but not visualized Neck: supple, no paraspinal tenderness, full range of motion Back: No paraspinal tenderness Heart: regular rate and rhythm Lungs: Clear to auscultation bilaterally. Vascular: No carotid bruits. Neurological Exam: Mental status: alert and oriented to person, place, and time, recent and remote memory intact, fund of knowledge intact, attention and concentration intact, speech fluent and not dysarthric, language intact. Cranial nerves: CN I: not tested CN II: pupils equal, round and reactive to light, visual fields intact CN III, IV, VI:  full range of motion, no nystagmus, no ptosis CN V: facial sensation intact. CN VII: upper and lower face symmetric CN VIII:  hearing intact CN IX, X: gag intact, uvula midline CN XI: sternocleidomastoid and trapezius muscles intact CN XII: tongue midline Bulk & Tone: normal, no rigidity or fasciculations. Motor:  muscle strength 5/5 throughout.  Reduced finger-thumb tapping speed and amplitude.   Sensation:  Pinprick, temperature and vibratory sensation intact. Deep Tendon Reflexes:  1+ throughout,  toes downgoing.   Finger to nose testing:  Without dysmetria.   Gait:  Requires assistance to stand.  Ambulates with walker.  Without walker, she has a broad-based gait with short strides.  Romberg with sway.    Thank you for allowing me to take part in the care of this patient.  Ann Clines, DO  CC:  Maury Dus, MD  Maude Leriche, PA-C

## 2021-08-05 ENCOUNTER — Ambulatory Visit: Payer: Medicare Other | Admitting: Neurology

## 2021-08-05 ENCOUNTER — Encounter: Payer: Self-pay | Admitting: Neurology

## 2021-08-05 ENCOUNTER — Other Ambulatory Visit: Payer: Self-pay

## 2021-08-05 VITALS — BP 110/58 | HR 58 | Ht 65.0 in | Wt 222.0 lb

## 2021-08-05 DIAGNOSIS — R2681 Unsteadiness on feet: Secondary | ICD-10-CM | POA: Diagnosis not present

## 2021-08-05 NOTE — Patient Instructions (Signed)
Plan is to get CT head in January.  If symptoms get worse, contact me and we will move it up Follow up afterwards

## 2021-08-11 DIAGNOSIS — E1169 Type 2 diabetes mellitus with other specified complication: Secondary | ICD-10-CM | POA: Diagnosis not present

## 2021-08-11 DIAGNOSIS — I10 Essential (primary) hypertension: Secondary | ICD-10-CM | POA: Diagnosis not present

## 2021-08-11 DIAGNOSIS — E039 Hypothyroidism, unspecified: Secondary | ICD-10-CM | POA: Diagnosis not present

## 2021-08-11 DIAGNOSIS — E782 Mixed hyperlipidemia: Secondary | ICD-10-CM | POA: Diagnosis not present

## 2021-08-11 DIAGNOSIS — M109 Gout, unspecified: Secondary | ICD-10-CM | POA: Diagnosis not present

## 2021-08-11 DIAGNOSIS — Z1211 Encounter for screening for malignant neoplasm of colon: Secondary | ICD-10-CM | POA: Diagnosis not present

## 2021-08-11 DIAGNOSIS — D696 Thrombocytopenia, unspecified: Secondary | ICD-10-CM | POA: Diagnosis not present

## 2021-08-12 ENCOUNTER — Telehealth: Payer: Self-pay | Admitting: Gastroenterology

## 2021-08-12 NOTE — Telephone Encounter (Signed)
I called Dr. Alyson Ingles - discussed her medication regimen with him in regards to my comments about her regimen during last clinic visit with me, specifically her blood pressure medications and her metformin.  Her diagnosis of cirrhosis is new, she has had some edema on her feet but no ascites.  I think okay to continue the regimen now if she needs these blood pressure medications to control her hypertension, however discussed that if she were develop worsening lower extremity edema or ascites then may want to hold the amlodipine and lisinopril, the latter due to concern for renal side effects, if needing other diuretics in the future.  I think okay to continue metformin now while she is compensated, however if she decompensates over time would need to hold that as well.  He will clarify her regimen with the patient and we can adjust things moving forward if needed.  Hopefully she remains compensated.

## 2021-08-12 NOTE — Telephone Encounter (Signed)
Dr. Maury Dus called regarding this patient and would like a call back from Dr. Havery Moros some time today.  He is questioning why the doctor wants to discontinue 3 of her blood pressure medications.  His cell phone number is [phone].  Thank you.

## 2021-08-26 ENCOUNTER — Telehealth: Payer: Self-pay | Admitting: Neurology

## 2021-08-26 DIAGNOSIS — R2681 Unsteadiness on feet: Secondary | ICD-10-CM

## 2021-08-26 NOTE — Telephone Encounter (Signed)
Pt called her ins regarding in home PT, and they do pay for therapy.. wants to know what the next step is

## 2021-08-29 NOTE — Telephone Encounter (Signed)
Dx.  Unsteady gait

## 2021-09-05 ENCOUNTER — Other Ambulatory Visit: Payer: Medicare Other

## 2021-09-16 ENCOUNTER — Telehealth: Payer: Self-pay | Admitting: Neurology

## 2021-09-16 NOTE — Telephone Encounter (Signed)
Tried a couple of home health agencies, unable to see patient during the thanksgiving holiday.   Sent another message to [facility] to see if they are able to see the patient now.

## 2021-09-16 NOTE — Telephone Encounter (Signed)
Patients brother don called about [facility] referral for PT in home service. He said no one has contacted him. [phone]

## 2021-09-19 ENCOUNTER — Telehealth: Payer: Self-pay

## 2021-09-19 DIAGNOSIS — I10 Essential (primary) hypertension: Secondary | ICD-10-CM | POA: Diagnosis not present

## 2021-09-19 DIAGNOSIS — E039 Hypothyroidism, unspecified: Secondary | ICD-10-CM | POA: Diagnosis not present

## 2021-09-19 DIAGNOSIS — Z1389 Encounter for screening for other disorder: Secondary | ICD-10-CM | POA: Diagnosis not present

## 2021-09-19 DIAGNOSIS — E782 Mixed hyperlipidemia: Secondary | ICD-10-CM | POA: Diagnosis not present

## 2021-09-19 DIAGNOSIS — E1169 Type 2 diabetes mellitus with other specified complication: Secondary | ICD-10-CM | POA: Diagnosis not present

## 2021-09-19 DIAGNOSIS — M109 Gout, unspecified: Secondary | ICD-10-CM | POA: Diagnosis not present

## 2021-09-19 DIAGNOSIS — Z23 Encounter for immunization: Secondary | ICD-10-CM | POA: Diagnosis not present

## 2021-09-19 DIAGNOSIS — Z0000 Encounter for general adult medical examination without abnormal findings: Secondary | ICD-10-CM | POA: Diagnosis not present

## 2021-09-19 NOTE — Telephone Encounter (Signed)
-----   Message from Ann Mcconnell, [place] sent at 07/29/2021  9:55 AM EDT ----- Regarding: due for CBC Patient due for CBC one day next week

## 2021-09-19 NOTE — Telephone Encounter (Signed)
Called and spoke to patient. She is having her physical with Dr. Alyson Ingles and will have lab work done for him. I asked that her to have them fax Dr. Havery Moros the result of cbc. She wrote down our fax number

## 2021-09-20 ENCOUNTER — Telehealth: Payer: Self-pay | Admitting: Neurology

## 2021-09-20 NOTE — Telephone Encounter (Signed)
[facility] needs an order to be delayed to Thursday. Can call kelly or Vicente Males [phone]

## 2021-09-21 ENCOUNTER — Other Ambulatory Visit: Payer: Self-pay | Admitting: Family Medicine

## 2021-09-21 DIAGNOSIS — E2839 Other primary ovarian failure: Secondary | ICD-10-CM

## 2021-09-21 NOTE — Telephone Encounter (Signed)
Per Claiborne Billings patient needs to have her start day of home health moved back a day. She will start 09/22/21  Verbal order to start her home health 09/22/21.

## 2021-09-23 ENCOUNTER — Telehealth: Payer: Self-pay | Admitting: Neurology

## 2021-09-23 DIAGNOSIS — R2681 Unsteadiness on feet: Secondary | ICD-10-CM | POA: Diagnosis not present

## 2021-09-23 DIAGNOSIS — E119 Type 2 diabetes mellitus without complications: Secondary | ICD-10-CM | POA: Diagnosis not present

## 2021-09-23 DIAGNOSIS — K7460 Unspecified cirrhosis of liver: Secondary | ICD-10-CM | POA: Diagnosis not present

## 2021-09-23 DIAGNOSIS — D649 Anemia, unspecified: Secondary | ICD-10-CM | POA: Diagnosis not present

## 2021-09-23 DIAGNOSIS — K31819 Angiodysplasia of stomach and duodenum without bleeding: Secondary | ICD-10-CM | POA: Diagnosis not present

## 2021-09-23 DIAGNOSIS — E039 Hypothyroidism, unspecified: Secondary | ICD-10-CM | POA: Diagnosis not present

## 2021-09-23 NOTE — Telephone Encounter (Signed)
Ann Mcconnell from [facility] needs verbal orders for 2 times a week for 5 weeks, 1 time a week for 2 weeks for strengthening transfers, gait, balance training, home exercise program, and home safety.

## 2021-09-26 NOTE — Telephone Encounter (Signed)
Tried calling Monique back , Per Dr.Jaffe verbal order for verbal orders for 2 times a week for 5 weeks, 1 time a week for 2 weeks for strengthening transfers, gait, balance training, home exercise program, and home safety.   LMVOm.

## 2021-09-27 DIAGNOSIS — E119 Type 2 diabetes mellitus without complications: Secondary | ICD-10-CM | POA: Diagnosis not present

## 2021-09-27 DIAGNOSIS — K31819 Angiodysplasia of stomach and duodenum without bleeding: Secondary | ICD-10-CM | POA: Diagnosis not present

## 2021-09-27 DIAGNOSIS — K7460 Unspecified cirrhosis of liver: Secondary | ICD-10-CM | POA: Diagnosis not present

## 2021-09-27 DIAGNOSIS — D649 Anemia, unspecified: Secondary | ICD-10-CM | POA: Diagnosis not present

## 2021-09-27 DIAGNOSIS — R2681 Unsteadiness on feet: Secondary | ICD-10-CM | POA: Diagnosis not present

## 2021-09-27 DIAGNOSIS — E039 Hypothyroidism, unspecified: Secondary | ICD-10-CM | POA: Diagnosis not present

## 2021-09-28 NOTE — Telephone Encounter (Signed)
Telephone call back to [facility] to insure Voice mail was received.  Verbal orders below.  Per Dr.Jaffe verbal order for verbal orders for 2 times a week for 5 weeks, 1 time a week for 2 weeks for strengthening transfers, gait, balance training, home exercise program, and home safety.

## 2021-09-29 DIAGNOSIS — K7460 Unspecified cirrhosis of liver: Secondary | ICD-10-CM | POA: Diagnosis not present

## 2021-09-29 DIAGNOSIS — E039 Hypothyroidism, unspecified: Secondary | ICD-10-CM | POA: Diagnosis not present

## 2021-09-29 DIAGNOSIS — K31819 Angiodysplasia of stomach and duodenum without bleeding: Secondary | ICD-10-CM | POA: Diagnosis not present

## 2021-09-29 DIAGNOSIS — R2681 Unsteadiness on feet: Secondary | ICD-10-CM | POA: Diagnosis not present

## 2021-09-29 DIAGNOSIS — D649 Anemia, unspecified: Secondary | ICD-10-CM | POA: Diagnosis not present

## 2021-09-29 DIAGNOSIS — E119 Type 2 diabetes mellitus without complications: Secondary | ICD-10-CM | POA: Diagnosis not present

## 2021-10-06 DIAGNOSIS — E039 Hypothyroidism, unspecified: Secondary | ICD-10-CM | POA: Diagnosis not present

## 2021-10-06 DIAGNOSIS — K31819 Angiodysplasia of stomach and duodenum without bleeding: Secondary | ICD-10-CM | POA: Diagnosis not present

## 2021-10-06 DIAGNOSIS — D649 Anemia, unspecified: Secondary | ICD-10-CM | POA: Diagnosis not present

## 2021-10-06 DIAGNOSIS — E119 Type 2 diabetes mellitus without complications: Secondary | ICD-10-CM | POA: Diagnosis not present

## 2021-10-06 DIAGNOSIS — K7460 Unspecified cirrhosis of liver: Secondary | ICD-10-CM | POA: Diagnosis not present

## 2021-10-06 DIAGNOSIS — R2681 Unsteadiness on feet: Secondary | ICD-10-CM | POA: Diagnosis not present

## 2021-10-11 DIAGNOSIS — N189 Chronic kidney disease, unspecified: Secondary | ICD-10-CM | POA: Diagnosis not present

## 2021-10-11 DIAGNOSIS — D631 Anemia in chronic kidney disease: Secondary | ICD-10-CM | POA: Diagnosis not present

## 2021-10-11 DIAGNOSIS — E1122 Type 2 diabetes mellitus with diabetic chronic kidney disease: Secondary | ICD-10-CM | POA: Diagnosis not present

## 2021-10-11 DIAGNOSIS — I129 Hypertensive chronic kidney disease with stage 1 through stage 4 chronic kidney disease, or unspecified chronic kidney disease: Secondary | ICD-10-CM | POA: Diagnosis not present

## 2021-10-11 DIAGNOSIS — K7460 Unspecified cirrhosis of liver: Secondary | ICD-10-CM | POA: Diagnosis not present

## 2021-10-11 DIAGNOSIS — K31819 Angiodysplasia of stomach and duodenum without bleeding: Secondary | ICD-10-CM | POA: Diagnosis not present

## 2021-10-12 ENCOUNTER — Telehealth: Payer: Self-pay | Admitting: Neurology

## 2021-10-12 NOTE — Telephone Encounter (Signed)
Vita Barley this is the one I asked you about...   Patient wants to know if the CT scan and the Ultra sound  can be done at the same place on the same day.   The CT scan is sch at [facility] @10  and the ultra sound is sch for [facility] @ 11  both on 10-17-21

## 2021-10-13 NOTE — Telephone Encounter (Signed)
Advised pt to call the [facility] who ordered the U/S to see if they will change to the order to [facility]. They may not be able to do that this close.

## 2021-10-17 ENCOUNTER — Other Ambulatory Visit: Payer: Self-pay

## 2021-10-17 ENCOUNTER — Ambulatory Visit
Admission: RE | Admit: 2021-10-17 | Discharge: 2021-10-17 | Disposition: A | Discharge status: Home / Self Care | Payer: Medicare Other | Source: Ambulatory Visit | Attending: Neurology | Admitting: Neurology

## 2021-10-17 ENCOUNTER — Ambulatory Visit (HOSPITAL_COMMUNITY)
Admission: RE | Admit: 2021-10-17 | Discharge: 2021-10-17 | Disposition: A | Discharge status: Home / Self Care | Payer: Medicare Other | Source: Ambulatory Visit | Attending: Gastroenterology | Admitting: Gastroenterology

## 2021-10-17 DIAGNOSIS — R188 Other ascites: Secondary | ICD-10-CM | POA: Diagnosis not present

## 2021-10-17 DIAGNOSIS — D509 Iron deficiency anemia, unspecified: Secondary | ICD-10-CM | POA: Diagnosis not present

## 2021-10-17 DIAGNOSIS — K31819 Angiodysplasia of stomach and duodenum without bleeding: Secondary | ICD-10-CM | POA: Insufficient documentation

## 2021-10-17 DIAGNOSIS — R2681 Unsteadiness on feet: Secondary | ICD-10-CM

## 2021-10-17 DIAGNOSIS — Z9049 Acquired absence of other specified parts of digestive tract: Secondary | ICD-10-CM | POA: Diagnosis not present

## 2021-10-17 DIAGNOSIS — K7460 Unspecified cirrhosis of liver: Secondary | ICD-10-CM | POA: Insufficient documentation

## 2021-10-18 ENCOUNTER — Telehealth: Payer: Self-pay

## 2021-10-18 DIAGNOSIS — K31819 Angiodysplasia of stomach and duodenum without bleeding: Secondary | ICD-10-CM | POA: Diagnosis not present

## 2021-10-18 DIAGNOSIS — N189 Chronic kidney disease, unspecified: Secondary | ICD-10-CM | POA: Diagnosis not present

## 2021-10-18 DIAGNOSIS — K7460 Unspecified cirrhosis of liver: Secondary | ICD-10-CM | POA: Diagnosis not present

## 2021-10-18 DIAGNOSIS — E1122 Type 2 diabetes mellitus with diabetic chronic kidney disease: Secondary | ICD-10-CM | POA: Diagnosis not present

## 2021-10-18 DIAGNOSIS — D631 Anemia in chronic kidney disease: Secondary | ICD-10-CM | POA: Diagnosis not present

## 2021-10-18 DIAGNOSIS — I129 Hypertensive chronic kidney disease with stage 1 through stage 4 chronic kidney disease, or unspecified chronic kidney disease: Secondary | ICD-10-CM | POA: Diagnosis not present

## 2021-10-18 NOTE — Telephone Encounter (Signed)
Verbal order given  

## 2021-10-18 NOTE — Telephone Encounter (Signed)
Telephone call from [facility] health, Need verbal order for 2 times a Week for 2 weeks then 1 time a week for 2 weeks To continue strengthening transfers, gait, balance training, home exercise program, and home safety.

## 2021-10-19 ENCOUNTER — Telehealth: Payer: Self-pay | Admitting: Neurology

## 2021-10-19 ENCOUNTER — Telehealth: Payer: Self-pay | Admitting: Gastroenterology

## 2021-10-19 DIAGNOSIS — D631 Anemia in chronic kidney disease: Secondary | ICD-10-CM | POA: Diagnosis not present

## 2021-10-19 DIAGNOSIS — N189 Chronic kidney disease, unspecified: Secondary | ICD-10-CM | POA: Diagnosis not present

## 2021-10-19 DIAGNOSIS — I129 Hypertensive chronic kidney disease with stage 1 through stage 4 chronic kidney disease, or unspecified chronic kidney disease: Secondary | ICD-10-CM | POA: Diagnosis not present

## 2021-10-19 DIAGNOSIS — E1122 Type 2 diabetes mellitus with diabetic chronic kidney disease: Secondary | ICD-10-CM | POA: Diagnosis not present

## 2021-10-19 DIAGNOSIS — K7460 Unspecified cirrhosis of liver: Secondary | ICD-10-CM | POA: Diagnosis not present

## 2021-10-19 DIAGNOSIS — K31819 Angiodysplasia of stomach and duodenum without bleeding: Secondary | ICD-10-CM | POA: Diagnosis not present

## 2021-10-19 NOTE — Telephone Encounter (Signed)
Called and spoke with patient in regards to results and recommendations. Pt states that she has an appt with her PCP on 10/28/21, she stated that they will be drawing labs at that office visit. She states that she has to have someone drive her to her appts so if she could get it done at the time of her appt with her PCP she would prefer to do that. She denies any overt signs of bleeding. She denies any fatigue or dizziness. She said that she has some SOB on exertion because she does a lot of sitting, she had PT today. She states that the SOB is minimal, and is not like she is about to pass out. Please advise if it is OK for her to wait until her f/u appt with her PCP. Thanks

## 2021-10-19 NOTE — Telephone Encounter (Signed)
Marden Noble will call back with the fax number for [facility].

## 2021-10-19 NOTE — Telephone Encounter (Signed)
Rollator order needed per Bianka with [facility].  Fax order to [place]

## 2021-10-19 NOTE — Telephone Encounter (Signed)
Labs arrived.  Dr. Ventura Bruns office 09/19/21:  Hgb 9.5, MCV 108, plt 161, WBC 8.6  BUN 29, Cr 1.47  T bil 2.6, AP 143, AST 62, ALT 33    Hgb has unfortunately dropped from 11.1 at our lab in October to 9.5 at Dr. Noland Fordyce office in December. She needs a follow up CBC drawn now - happy to do it hear or she can have it done at her PCP's office but should be done now, if she can. Thanks Floyd can you let her know? thanks

## 2021-10-20 NOTE — Telephone Encounter (Signed)
Called and spoke with patient to inform her that it is OK for her to wait until her appt with Dr. Alyson Ingles next week. Pt knows to contact us if she has any worsening symptoms in the interim. Pt will have Dr. Noland Fordyce office fax over her lab results. Pt verbalized understanding and had no concerns at the end of the call.

## 2021-10-20 NOTE — Telephone Encounter (Signed)
Okay in this light okay to have labs done next week with Dr. Alyson Ingles - if any worsening symptoms in the interim she needs to let him or [place] know. Thanks

## 2021-10-24 DIAGNOSIS — N189 Chronic kidney disease, unspecified: Secondary | ICD-10-CM | POA: Diagnosis not present

## 2021-10-24 DIAGNOSIS — K7460 Unspecified cirrhosis of liver: Secondary | ICD-10-CM | POA: Diagnosis not present

## 2021-10-24 DIAGNOSIS — I129 Hypertensive chronic kidney disease with stage 1 through stage 4 chronic kidney disease, or unspecified chronic kidney disease: Secondary | ICD-10-CM | POA: Diagnosis not present

## 2021-10-24 DIAGNOSIS — K31819 Angiodysplasia of stomach and duodenum without bleeding: Secondary | ICD-10-CM | POA: Diagnosis not present

## 2021-10-24 DIAGNOSIS — E1122 Type 2 diabetes mellitus with diabetic chronic kidney disease: Secondary | ICD-10-CM | POA: Diagnosis not present

## 2021-10-24 DIAGNOSIS — D631 Anemia in chronic kidney disease: Secondary | ICD-10-CM | POA: Diagnosis not present

## 2021-10-26 DIAGNOSIS — E782 Mixed hyperlipidemia: Secondary | ICD-10-CM | POA: Diagnosis not present

## 2021-10-26 DIAGNOSIS — E1165 Type 2 diabetes mellitus with hyperglycemia: Secondary | ICD-10-CM | POA: Diagnosis not present

## 2021-10-26 DIAGNOSIS — E1169 Type 2 diabetes mellitus with other specified complication: Secondary | ICD-10-CM | POA: Diagnosis not present

## 2021-10-26 DIAGNOSIS — E039 Hypothyroidism, unspecified: Secondary | ICD-10-CM | POA: Diagnosis not present

## 2021-10-26 DIAGNOSIS — I10 Essential (primary) hypertension: Secondary | ICD-10-CM | POA: Diagnosis not present

## 2021-10-26 DIAGNOSIS — N1831 Chronic kidney disease, stage 3a: Secondary | ICD-10-CM | POA: Diagnosis not present

## 2021-10-27 DIAGNOSIS — E1122 Type 2 diabetes mellitus with diabetic chronic kidney disease: Secondary | ICD-10-CM | POA: Diagnosis not present

## 2021-10-27 DIAGNOSIS — I129 Hypertensive chronic kidney disease with stage 1 through stage 4 chronic kidney disease, or unspecified chronic kidney disease: Secondary | ICD-10-CM | POA: Diagnosis not present

## 2021-10-27 DIAGNOSIS — K31819 Angiodysplasia of stomach and duodenum without bleeding: Secondary | ICD-10-CM | POA: Diagnosis not present

## 2021-10-27 DIAGNOSIS — D631 Anemia in chronic kidney disease: Secondary | ICD-10-CM | POA: Diagnosis not present

## 2021-10-27 DIAGNOSIS — N189 Chronic kidney disease, unspecified: Secondary | ICD-10-CM | POA: Diagnosis not present

## 2021-10-27 DIAGNOSIS — K7460 Unspecified cirrhosis of liver: Secondary | ICD-10-CM | POA: Diagnosis not present

## 2021-10-31 DIAGNOSIS — N1831 Chronic kidney disease, stage 3a: Secondary | ICD-10-CM | POA: Diagnosis not present

## 2021-10-31 DIAGNOSIS — I129 Hypertensive chronic kidney disease with stage 1 through stage 4 chronic kidney disease, or unspecified chronic kidney disease: Secondary | ICD-10-CM | POA: Diagnosis not present

## 2021-10-31 DIAGNOSIS — K7460 Unspecified cirrhosis of liver: Secondary | ICD-10-CM | POA: Diagnosis not present

## 2021-10-31 DIAGNOSIS — K31819 Angiodysplasia of stomach and duodenum without bleeding: Secondary | ICD-10-CM | POA: Diagnosis not present

## 2021-10-31 DIAGNOSIS — D500 Iron deficiency anemia secondary to blood loss (chronic): Secondary | ICD-10-CM | POA: Diagnosis not present

## 2021-10-31 DIAGNOSIS — D631 Anemia in chronic kidney disease: Secondary | ICD-10-CM | POA: Diagnosis not present

## 2021-10-31 DIAGNOSIS — E1122 Type 2 diabetes mellitus with diabetic chronic kidney disease: Secondary | ICD-10-CM | POA: Diagnosis not present

## 2021-10-31 DIAGNOSIS — N189 Chronic kidney disease, unspecified: Secondary | ICD-10-CM | POA: Diagnosis not present

## 2021-11-02 DIAGNOSIS — D631 Anemia in chronic kidney disease: Secondary | ICD-10-CM | POA: Diagnosis not present

## 2021-11-02 DIAGNOSIS — E1122 Type 2 diabetes mellitus with diabetic chronic kidney disease: Secondary | ICD-10-CM | POA: Diagnosis not present

## 2021-11-02 DIAGNOSIS — I129 Hypertensive chronic kidney disease with stage 1 through stage 4 chronic kidney disease, or unspecified chronic kidney disease: Secondary | ICD-10-CM | POA: Diagnosis not present

## 2021-11-02 DIAGNOSIS — K7460 Unspecified cirrhosis of liver: Secondary | ICD-10-CM | POA: Diagnosis not present

## 2021-11-02 DIAGNOSIS — K31819 Angiodysplasia of stomach and duodenum without bleeding: Secondary | ICD-10-CM | POA: Diagnosis not present

## 2021-11-02 DIAGNOSIS — N189 Chronic kidney disease, unspecified: Secondary | ICD-10-CM | POA: Diagnosis not present

## 2021-11-08 DIAGNOSIS — K7460 Unspecified cirrhosis of liver: Secondary | ICD-10-CM | POA: Diagnosis not present

## 2021-11-08 DIAGNOSIS — E1122 Type 2 diabetes mellitus with diabetic chronic kidney disease: Secondary | ICD-10-CM | POA: Diagnosis not present

## 2021-11-08 DIAGNOSIS — K31819 Angiodysplasia of stomach and duodenum without bleeding: Secondary | ICD-10-CM | POA: Diagnosis not present

## 2021-11-08 DIAGNOSIS — I129 Hypertensive chronic kidney disease with stage 1 through stage 4 chronic kidney disease, or unspecified chronic kidney disease: Secondary | ICD-10-CM | POA: Diagnosis not present

## 2021-11-08 DIAGNOSIS — D631 Anemia in chronic kidney disease: Secondary | ICD-10-CM | POA: Diagnosis not present

## 2021-11-08 DIAGNOSIS — N189 Chronic kidney disease, unspecified: Secondary | ICD-10-CM | POA: Diagnosis not present

## 2021-11-11 DIAGNOSIS — D649 Anemia, unspecified: Secondary | ICD-10-CM | POA: Diagnosis not present

## 2021-11-11 DIAGNOSIS — N1831 Chronic kidney disease, stage 3a: Secondary | ICD-10-CM | POA: Diagnosis not present

## 2021-11-14 ENCOUNTER — Ambulatory Visit: Payer: Medicare Other | Admitting: Physician Assistant

## 2021-11-14 ENCOUNTER — Encounter: Payer: Self-pay | Admitting: Physician Assistant

## 2021-11-14 VITALS — BP 90/60 | HR 79 | Ht 66.0 in | Wt 248.0 lb

## 2021-11-14 DIAGNOSIS — D509 Iron deficiency anemia, unspecified: Secondary | ICD-10-CM | POA: Diagnosis not present

## 2021-11-14 DIAGNOSIS — K7460 Unspecified cirrhosis of liver: Secondary | ICD-10-CM | POA: Diagnosis not present

## 2021-11-14 DIAGNOSIS — R188 Other ascites: Secondary | ICD-10-CM

## 2021-11-14 DIAGNOSIS — K31819 Angiodysplasia of stomach and duodenum without bleeding: Secondary | ICD-10-CM

## 2021-11-14 NOTE — Progress Notes (Signed)
Chief Complaint: Follow-up cirrhosis  HPI:    Ann Mcconnell is a 76 year old female with a past medical history of cirrhosis, diabetes, GAVE and multiple others, known to Dr. Havery Moros, who presents to clinic today for follow-up of her cirrhosis.    07/28/2021 patient seen in follow-up by Dr. Havery Moros.  Please see that detailed note for further information.  That time and been doing well on an iron tablet daily.  She had received a total of 5 IV iron infusions over the past few months.  At times discussed she had cirrhosis likely due to fatty liver, iron deficiency anemia and gave.  A few months back had been treated with APC in the hospital.  She had compensated cirrhosis.  Question of mild encephalopathy.  At that time discussed repeat ultrasound in January 2023.  She was continued on oral iron.    09/19/2021 hemoglobin 9.5.    10/18/2021 right upper quadrant ultrasound with cirrhosis and ascites and patent main portal vein.  Recall ultrasound recommended in 6 months.    10/31/2021 CBC with a hemoglobin of 9.5 (this was noted to be stable and essentially unchanged from her last 2 CBCs), BMP showed elevated glucose at 127 and creatinine of 2.19.    Today, the patient presents to clinic accompanied by her family member and they explained that the patient has had a lot of fluid buildup over the past 2 months.  She has gained around 20 pounds and feels like her legs are swollen as well as her abdomen and it is hard for her to take a deep breath.  Her family member with her tells me it took them 10 minutes just to get down 1 step out of her house today as it become increasingly difficult for her to move around.  Family member also concerned about creatinine level as this seems to be increasing over the last few blood works and no one has addressed it.  Patient denies any change in bowel habits or increasingly dark/smelly stool.  She continues on her oral iron supplementation.    Denies fever, chills,  weight loss or symptoms that awaken her from sleep.  Prior workup: EGD 05/02/2021: - Z-line regular, 36 cm from the incisors. - No gross lesions in esophagus. - Gastric antral vascular ectasia. Treated with argon plasma coagulation (APC). Likely etiology for GI blood loss and anemia - Normal examined duodenum. - No specimens collected.   Colonoscopy 05/02/2021: - Non-bleeding external and internal hemorrhoids. - The examination was otherwise normal. - No specimens collected. - No repeat colonoscopy due to age    Abdominal sonogram 05/01/2021: 1. No acute finding. 2. Lobulated liver surface that is suspicious for cirrhosis. 3. Bowel gas obscures the aorta and pancreas. 4. Spleen: 350 cc volume, upper normal.  No focal abnormality.     06/06/21 - ferritin 121, iron sat 6%, iron 25 Hgb dropped to 7.7 on 8/29 Hgb 7.5 on 06/10/21 Hgb 8.2 on 06/17/21  Past Medical History:  Diagnosis Date   Cirrhosis ([place])    Diabetes mellitus without complication ([place])    DOE (dyspnea on exertion)    GAVE (gastric antral vascular ectasia)    Gout    HLD (hyperlipidemia)    HTN (hypertension)    Hypothyroidism    Tachycardia     Past Surgical History:  Procedure Laterality Date   Arm Surgery     BREAST REDUCTION SURGERY     CHOLECYSTECTOMY     COLONOSCOPY WITH PROPOFOL N/A 05/02/2021  Procedure: COLONOSCOPY WITH PROPOFOL;  Surgeon: Mauri Pole, MD;  Location: [facility];  Service: Endoscopy;  Laterality: N/A;   ESOPHAGOGASTRODUODENOSCOPY (EGD) WITH PROPOFOL N/A 05/02/2021   Procedure: ESOPHAGOGASTRODUODENOSCOPY (EGD) WITH PROPOFOL;  Surgeon: Mauri Pole, MD;  Location: [facility];  Service: Endoscopy;  Laterality: N/A;   HOT HEMOSTASIS N/A 05/02/2021   Procedure: HOT HEMOSTASIS (ARGON PLASMA COAGULATION/BICAP);  Surgeon: Mauri Pole, MD;  Location: [facility];  Service: Endoscopy;  Laterality: N/A;   REDUCTION MAMMAPLASTY      Current Outpatient Medications   Medication Sig Dispense Refill   Accu-Chek FastClix Lancets MISC Apply topically.     ACCU-CHEK GUIDE test strip      allopurinol (ZYLOPRIM) 300 MG tablet Take 300 mg by mouth daily.     amLODipine (NORVASC) 5 MG tablet Take 5 mg by mouth daily.     busPIRone (BUSPAR) 10 MG tablet Take 10 mg by mouth daily.     CALCIUM PO Take 1 Dose by mouth daily.     Cholecalciferol (VITAMIN D3 PO) Take 1 Dose by mouth daily.     citalopram (CELEXA) 40 MG tablet Take 40 mg by mouth daily.     Cyanocobalamin (VITAMIN B-12 PO) Take 1 Dose by mouth daily.     ferrous sulfate 325 (65 FE) MG tablet Take 1 tablet (325 mg total) by mouth daily with breakfast. 30 tablet 1   Glucosamine HCl (GLUCOSAMINE PO) Take 1 Dose by mouth daily.     levothyroxine (SYNTHROID) 100 MCG tablet Take 100 mcg by mouth daily.     lisinopril-hydrochlorothiazide (ZESTORETIC) 20-12.5 MG tablet Take 1 tablet by mouth daily.     metFORMIN (GLUCOPHAGE-XR) 500 MG 24 hr tablet Take 1,000 mg by mouth daily.     metoprolol succinate (TOPROL-XL) 100 MG 24 hr tablet Take 100 mg by mouth daily. Take with or immediately following a meal.     Omega-3 Fatty Acids (FISH OIL PO) Take 1 Dose by mouth daily.     pantoprazole (PROTONIX) 40 MG tablet Take 1 tablet (40 mg total) by mouth in the morning. 90 tablet 0   pravastatin (PRAVACHOL) 40 MG tablet Take 40 mg by mouth daily.     No current facility-administered medications for this visit.    Allergies as of 11/14/2021   (No Known Allergies)    Family History  Problem Relation Age of Onset   Heart attack Other    Lymphoma Other    COPD Other    Thyroid disease Other    Sleep apnea Other     Social History   Socioeconomic History   Marital status: Widowed    Spouse name: Not on file   Number of children: 1   Years of education: Not on file   Highest education level: Not on file  Occupational History   Occupation: retired  Tobacco Use   Smoking status: Never   Smokeless tobacco:  Never  Substance and Sexual Activity   Alcohol use: No   Drug use: Never   Sexual activity: Not on file  Other Topics Concern   Not on file  Social History Narrative   Not on file   Social Determinants of Health   Financial Resource Strain: Not on file  Food Insecurity: Not on file  Transportation Needs: Not on file  Physical Activity: Not on file  Stress: Not on file  Social Connections: Not on file  Intimate Partner Violence: Not on file    Review of Systems:  Constitutional: No weight loss, fever or chills Cardiovascular: No chest pain Respiratory: No SOB Gastrointestinal: See HPI and otherwise negative Genitourinary: No dysuria  Neurological: No headache, dizziness or syncope Musculoskeletal: No new muscle or joint pain Hematologic: No bleeding Psychiatric: No history of depression or anxiety   Physical Exam:  Vital signs: BP 90/60    Pulse 79    Ht 5\' 6"  (1.676 m)    Wt 248 lb (112.5 kg)    BMI 40.03 kg/m    Constitutional:   Pleasant obese Caucasian female appears to be in NAD, Well developed, Well nourished, alert and cooperative  Respiratory: Respirations even and unlabored. Lungs clear to auscultation bilaterally.   No wheezes, crackles, or rhonchi.  Cardiovascular: Normal S1, S2. No MRG. Regular rate and rhythm. + b/l edema to level just above the knee Gastrointestinal:  Tense, Moderate distension, mild generalized ttp, No rebound or guarding. Normal bowel sounds. No appreciable masses or hepatomegaly. Rectal:  Not performed.  Msk:  Symmetrical without gross deformities. Without edema, no deformity or joint abnormality. +in wheelchair Psychiatric: Oriented to person, place and time. Demonstrates good judgement and reason without abnormal affect or behaviors.  RELEVANT LABS AND IMAGING: CBC    Component Value Date/Time   WBC 7.9 07/28/2021 1158   RBC 3.25 (L) 07/28/2021 1158   HGB 11.1 (L) 07/28/2021 1158   HCT 33.3 (L) 07/28/2021 1158   PLT 125.0 (L)  07/28/2021 1158   MCV 102.5 (H) 07/28/2021 1158   MCH 25.0 (L) 05/03/2021 0354   MCHC 33.2 07/28/2021 1158   RDW 22.3 (H) 07/28/2021 1158   LYMPHSABS 2.6 07/28/2021 1158   MONOABS 0.5 07/28/2021 1158   EOSABS 0.3 07/28/2021 1158   BASOSABS 0.1 07/28/2021 1158    CMP     Component Value Date/Time   NA 137 07/28/2021 1158   K 4.5 07/28/2021 1158   CL 104 07/28/2021 1158   CO2 25 07/28/2021 1158   GLUCOSE 128 (H) 07/28/2021 1158   BUN 20 07/28/2021 1158   CREATININE 1.35 (H) 07/28/2021 1158   CALCIUM 9.3 07/28/2021 1158   PROT 7.3 07/28/2021 1158   ALBUMIN 3.1 (L) 07/28/2021 1158   AST 74 (H) 07/28/2021 1158   ALT 37 (H) 07/28/2021 1158   ALKPHOS 147 (H) 07/28/2021 1158   BILITOT 3.1 (H) 07/28/2021 1158   GFRNONAA 57 (L) 05/03/2021 0354    Assessment: 1.  Decompensated cirrhosis with ascites: Thought related to fatty liver, increasing fluid buildup over the past 2 months, reported 20 pound weight gain and peripheral edema 2.  Iron deficiency anemia: With history of GAVE, hemoglobin holding steady around 9.5 as checked by her PCP 3.  Kidney disease: Creatinine has been rising recently  Plan: 1.  Patient would benefit from diuretics, though I am reluctant to start these right now given rising creatinine level.  Sent patient an urgent/emergent referral to [facility] to be seen in regards to the rising creatinine and discussion about starting diuretics including Spironolactone 50 mg daily and Lasix 40 mg daily if they see fit. 2.  For now we will schedule the patient for therapeutic ultrasound-guided paracentesis with albumin replacement if greater than 5 L drawn.  Orders were placed for this today.  Hopefully this will provide her some relief, though discussed that the fluid will likely just build back up without diuretics. 3.  Patient is up-to-date on [place] screening. 4.  Discussed drop in hemoglobin, some oozing can be normal with GAVE which is likely what  happened, it seems to  be holding steady now.  She needs to continue to have this monitored by her PCP. 5.  We will also alert Dr. Havery Moros to above to make sure he has no further recommendations. 6.  Recommend the patient continue to monitor daily weights. 7.  Patient will follow in clinic in 4 to 6 weeks or sooner with Dr. Havery Moros or myself.  Ann Newer, PA-C [facility] 11/14/2021, 10:44 AM  Cc: Ann Dus, MD

## 2021-11-14 NOTE — Patient Instructions (Signed)
We place placed a referral to Ambulatory referral to Nephrology ([place]) Address: [address] [place], [place] [address] Phone: [phone]  You should hear from their office in the next couple weeks to schedule an appointment. If you do not hear from them please call them at [phone].  You have been scheduled for an abdominal paracentesis at [facility] radiology (1st floor of hospital) on 11/15/21 at 2:00pm. Please arrive at least 15 minutes prior to your appointment time for registration. Should you need to reschedule this appointment for any reason, please call our office at [phone].  BMI:  If you are age 38 or older, your body mass index should be between 23-30. Your Body mass index is 40.03 kg/m. If this is out of the aforementioned range listed, please consider follow up with your Primary Care Provider.  If you are age 52 or younger, your body mass index should be between 19-25. Your Body mass index is 40.03 kg/m. If this is out of the aformentioned range listed, please consider follow up with your Primary Care Provider.   MY CHART:  The [facility] providers would like to encourage you to use [facility] to communicate with providers for non-urgent requests or questions.  Due to long hold times on the telephone, sending your provider a message by [facility] may be a faster and more efficient way to get a response.  Please allow 48 business hours for a response.  Please remember that this is for non-urgent requests.   Thank you for trusting me with your gastrointestinal care!    Ellouise Newer, [place]

## 2021-11-14 NOTE — Progress Notes (Signed)
Of note I called the patient and called her call phone / home phone, no answer. I  left 2 messages stating to stop her lisinopril and norvasc now, that she needs labs in the AM to repeat a BMET and recheck her renal function.   [place] can you please touch base with this patient in the AM to check on her, order BMET and ask her to go to the lab. Thanks

## 2021-11-14 NOTE — Progress Notes (Signed)
Agree with the following thoughts: Difficult situation.  She sounds volume overloaded with worsening ascites.  It appears her last lab check was on 1/23, she needs a BMET now to reassess her creatinine assess electrolytes.  If she is quite symptomatic she may need to be admitted to the hospital pending her renal function.  Her lisinopril HCTZ should be stopped in the setting of acute kidney injury, and would stop Norvasc as well.  Ultimately once her renal function improved she needs to be started on some diuretics, agree with nephrology consultation however more acutely she needs labs to make sure she does not warrant admission given her symptoms sooner.  If she does not have a recent echocardiogram that should also be done.  Anderson Malta I will talk with you more about this

## 2021-11-15 ENCOUNTER — Telehealth: Payer: Self-pay | Admitting: Physician Assistant

## 2021-11-15 ENCOUNTER — Ambulatory Visit (HOSPITAL_COMMUNITY)
Admission: RE | Admit: 2021-11-15 | Discharge: 2021-11-15 | Disposition: A | Discharge status: Home / Self Care | Payer: Medicare Other | Source: Ambulatory Visit | Attending: Physician Assistant | Admitting: Physician Assistant

## 2021-11-15 ENCOUNTER — Other Ambulatory Visit: Payer: Self-pay

## 2021-11-15 ENCOUNTER — Other Ambulatory Visit (INDEPENDENT_AMBULATORY_CARE_PROVIDER_SITE_OTHER): Payer: Medicare Other

## 2021-11-15 ENCOUNTER — Telehealth: Payer: Self-pay

## 2021-11-15 DIAGNOSIS — K7460 Unspecified cirrhosis of liver: Secondary | ICD-10-CM | POA: Insufficient documentation

## 2021-11-15 DIAGNOSIS — R188 Other ascites: Secondary | ICD-10-CM | POA: Insufficient documentation

## 2021-11-15 DIAGNOSIS — I10 Essential (primary) hypertension: Secondary | ICD-10-CM

## 2021-11-15 DIAGNOSIS — E8770 Fluid overload, unspecified: Secondary | ICD-10-CM

## 2021-11-15 DIAGNOSIS — R0602 Shortness of breath: Secondary | ICD-10-CM

## 2021-11-15 HISTORY — PX: IR PARACENTESIS: IMG2679

## 2021-11-15 LAB — BASIC METABOLIC PANEL
BUN: 35 mg/dL — ABNORMAL HIGH (ref 6–23)
CO2: 26 mEq/L (ref 19–32)
Calcium: 9.1 mg/dL (ref 8.4–10.5)
Chloride: 102 mEq/L (ref 96–112)
Creatinine, Ser: 1.95 mg/dL — ABNORMAL HIGH (ref 0.40–1.20)
GFR: 24.69 mL/min — ABNORMAL LOW (ref >60.00)
Glucose, Bld: 109 mg/dL — ABNORMAL HIGH (ref 70–99)
Potassium: 4.4 mEq/L (ref 3.5–5.1)
Sodium: 133 mEq/L — ABNORMAL LOW (ref 135–145)

## 2021-11-15 MED ORDER — LIDOCAINE HCL 1 % IJ SOLN
INTRAMUSCULAR | Status: DC | PRN
  Administered 2021-11-15: 5 mL via INTRADERMAL

## 2021-11-15 MED ORDER — LIDOCAINE HCL 1 % IJ SOLN
INTRAMUSCULAR | Status: AC
  Filled 2021-11-15: qty 20

## 2021-11-15 NOTE — Telephone Encounter (Signed)
Levin Erp, PA  [place], [place], [place]; Armbruster, Carlota Raspberry, MD Reviewed patient's labs from today with Dr. Havery Moros her creatinine has actually improved since the last one I had from January.  We never received the labs from her primary care provider that we have sent multiple requests and I was on hold with them for 20 to 30 minutes.  Please let patient know that we tried.  At this point hopefully the paracentesis helped her.  We need to recheck her BMP on Friday of this week.  Please place order and let her know.   Also Dr. Havery Moros would like a chest x-ray and echo done as well.  She can have the chest x-ray done here when she comes in for labs on Friday and the echo will need to be ordered.   Of course if her symptoms worsen or she has increased trouble breathing or increasing abdominal distention/discomfort then she needs to go to the ER as we have discussed.   Thanks, JL L

## 2021-11-15 NOTE — Procedures (Signed)
PROCEDURE SUMMARY:  Successful US guided paracentesis from RLQ.  Yielded 4.4L of yellow fluid.  No immediate complications.  Pt tolerated well.   Specimen not sent for labs.  EBL < 50mL  Stewart Sasaki PA-C 11/15/2021 2:55 PM

## 2021-11-15 NOTE — Addendum Note (Signed)
Addended by: Yevette Edwards on: 11/15/2021 04:46 PM   Modules accepted: Orders

## 2021-11-15 NOTE — Telephone Encounter (Addendum)
Returned call to [employer]. I told him that we do not direct admit patient's. I told him that if he thinks pt needs to be admitted then she would need to go to the ER for evaluation and they would admit her if necessary. I told him that our providers round at [facility] or [facility]. He states that pt is currently getting a paracentesis and I told him that we are still waiting on pt's labs. He states that we will see how today goes and go from there. Don verbalized understanding and had no concerns at the end of the call.

## 2021-11-15 NOTE — Telephone Encounter (Signed)
Author: Yetta Flock, MD Service: Gastroenterology Author Type: Physician  Filed: 11/14/2021  6:23 PM Encounter Date: 11/14/2021 Status: Signed  Editor: Armbruster, Carlota Raspberry, MD (Physician)        Of note I called the patient and called her call phone / home phone, no answer. I  left 2 messages stating to stop her lisinopril and norvasc now, that she needs labs in the AM to repeat a BMET and recheck her renal function.    Manjot Beumer can you please touch base with this patient in the AM to check on her, order BMET and ask her to go to the lab. Thanks

## 2021-11-15 NOTE — Telephone Encounter (Signed)
Telephone call  11/15/2021 8:33 AM  Spoke with patient's family member directly this morning.  They have already received a phone call from our nursing staff in regards to labs today and how urgently they need to be done.  They are waiting on a ride for the patient to come to clinic to get them drawn.  She is scheduled for paracentesis later this afternoon.  Family member is very appreciative of [place] following up so quickly on everything as she has been worried about the patient, she seems to be getting worse with increased shortness of breath and difficulty with daily functions as well as falling asleep.  They explain that patient did have labs drawn on Friday, 11/11/2021 at her PCPs office which was communicated with me at time of office visit.  I will try and call Dr. Alyson Ingles at [facility] to get these lab report so we can see the trend.  Discussed with family member that if she is at worried about patient then she needs to go to the ER where all of this could be done sooner and more efficiently.  She verbalized understanding and told me that they will see how the day goes, but may take her anyways.  Ellouise Newer, PA-C

## 2021-11-15 NOTE — Telephone Encounter (Signed)
Pt's brother Timmothy Sours brought pt into the office to have blood drawn. He was directed to the lab. He stated that pt needs to be admitted to the hospital because she is having a difficult time even getting around the house. He would like a call back to see how we can help pt with that. His phone is [phone].

## 2021-11-15 NOTE — Telephone Encounter (Signed)
Called and spoke with patient and Ann Mcconnell, her daughter-in-law. They have been advised that pt needs to stop Lisinopril and Norvasc. I advised that Dr. Havery Moros would like for her to come in for labs this morning to check her kidney function. Ann Mcconnell stated that it takes the patient a little while to get together. They are going to reach out to patient's son to see if he can bring her by the lab before her paracentesis appt this afternoon. I again advised that it would be best if she could come in this morning but we understand. Ann Mcconnell verbalized understanding of all information and had no concerns at the end of the call.  Lab order in epic.

## 2021-11-15 NOTE — Telephone Encounter (Signed)
Called and spoke with Helene Kelp regarding pt's lab results and recommendations. Helene Kelp is aware that Anderson Malta requested recent labs from last week but did not receive them although she waited about 30 minutes. Helene Kelp knows that pt will need to complete repeat labs and chest x-ray on Friday. Helene Kelp is aware that no appt is necessary for labs or x-ray. No diet restrictions prior to x-ray either. She is aware that Dr. Havery Moros has also recommended and echocardiogram and we will be in contact about that appt soon. Helene Kelp verbalized understanding of all information and had no concerns at the end of the call.   Lab order and reminder in epic.   X-ray order in epic.  Echocardiogram order in epic. I called and spoke with Earlie Server at [facility] [place] de[facility]. Earlie Server states that they will contact pt to set up her appt.

## 2021-11-17 ENCOUNTER — Telehealth: Payer: Self-pay | Admitting: Physician Assistant

## 2021-11-17 ENCOUNTER — Encounter (HOSPITAL_COMMUNITY): Payer: Self-pay | Admitting: Internal Medicine

## 2021-11-17 ENCOUNTER — Inpatient Hospital Stay (HOSPITAL_COMMUNITY)
Admission: EM | Admit: 2021-11-17 | Discharge: 2021-11-22 | DRG: 442 | Disposition: A | Discharge status: Home Health | Payer: Medicare Other | Attending: Internal Medicine | Admitting: Internal Medicine

## 2021-11-17 ENCOUNTER — Emergency Department (HOSPITAL_COMMUNITY): Payer: Medicare Other

## 2021-11-17 DIAGNOSIS — E1122 Type 2 diabetes mellitus with diabetic chronic kidney disease: Secondary | ICD-10-CM | POA: Diagnosis not present

## 2021-11-17 DIAGNOSIS — R627 Adult failure to thrive: Secondary | ICD-10-CM | POA: Diagnosis present

## 2021-11-17 DIAGNOSIS — K7682 Hepatic encephalopathy: Secondary | ICD-10-CM | POA: Diagnosis not present

## 2021-11-17 DIAGNOSIS — E785 Hyperlipidemia, unspecified: Secondary | ICD-10-CM | POA: Diagnosis present

## 2021-11-17 DIAGNOSIS — R0602 Shortness of breath: Secondary | ICD-10-CM | POA: Diagnosis present

## 2021-11-17 DIAGNOSIS — K7469 Other cirrhosis of liver: Secondary | ICD-10-CM | POA: Diagnosis not present

## 2021-11-17 DIAGNOSIS — Z20822 Contact with and (suspected) exposure to covid-19: Secondary | ICD-10-CM | POA: Diagnosis present

## 2021-11-17 DIAGNOSIS — M109 Gout, unspecified: Secondary | ICD-10-CM | POA: Diagnosis not present

## 2021-11-17 DIAGNOSIS — R6889 Other general symptoms and signs: Secondary | ICD-10-CM | POA: Diagnosis not present

## 2021-11-17 DIAGNOSIS — D689 Coagulation defect, unspecified: Secondary | ICD-10-CM | POA: Diagnosis not present

## 2021-11-17 DIAGNOSIS — R531 Weakness: Secondary | ICD-10-CM | POA: Diagnosis not present

## 2021-11-17 DIAGNOSIS — K7460 Unspecified cirrhosis of liver: Secondary | ICD-10-CM | POA: Diagnosis present

## 2021-11-17 DIAGNOSIS — I10 Essential (primary) hypertension: Secondary | ICD-10-CM | POA: Diagnosis present

## 2021-11-17 DIAGNOSIS — N1831 Chronic kidney disease, stage 3a: Secondary | ICD-10-CM | POA: Diagnosis present

## 2021-11-17 DIAGNOSIS — E871 Hypo-osmolality and hyponatremia: Secondary | ICD-10-CM | POA: Diagnosis not present

## 2021-11-17 DIAGNOSIS — R9431 Abnormal electrocardiogram [ECG] [EKG]: Secondary | ICD-10-CM | POA: Diagnosis not present

## 2021-11-17 DIAGNOSIS — N179 Acute kidney failure, unspecified: Secondary | ICD-10-CM

## 2021-11-17 DIAGNOSIS — E7220 Disorder of urea cycle metabolism, unspecified: Secondary | ICD-10-CM

## 2021-11-17 DIAGNOSIS — E669 Obesity, unspecified: Secondary | ICD-10-CM | POA: Diagnosis present

## 2021-11-17 DIAGNOSIS — E8770 Fluid overload, unspecified: Secondary | ICD-10-CM | POA: Diagnosis not present

## 2021-11-17 DIAGNOSIS — Z79899 Other long term (current) drug therapy: Secondary | ICD-10-CM

## 2021-11-17 DIAGNOSIS — E039 Hypothyroidism, unspecified: Secondary | ICD-10-CM | POA: Diagnosis present

## 2021-11-17 DIAGNOSIS — K766 Portal hypertension: Secondary | ICD-10-CM | POA: Diagnosis present

## 2021-11-17 DIAGNOSIS — I129 Hypertensive chronic kidney disease with stage 1 through stage 4 chronic kidney disease, or unspecified chronic kidney disease: Secondary | ICD-10-CM | POA: Diagnosis present

## 2021-11-17 DIAGNOSIS — K7581 Nonalcoholic steatohepatitis (NASH): Secondary | ICD-10-CM | POA: Diagnosis not present

## 2021-11-17 DIAGNOSIS — R17 Unspecified jaundice: Secondary | ICD-10-CM

## 2021-11-17 DIAGNOSIS — Z79890 Hormone replacement therapy (postmenopausal): Secondary | ICD-10-CM

## 2021-11-17 DIAGNOSIS — Z7984 Long term (current) use of oral hypoglycemic drugs: Secondary | ICD-10-CM

## 2021-11-17 DIAGNOSIS — Z7401 Bed confinement status: Secondary | ICD-10-CM | POA: Diagnosis not present

## 2021-11-17 DIAGNOSIS — Z6838 Body mass index (BMI) 38.0-38.9, adult: Secondary | ICD-10-CM | POA: Diagnosis not present

## 2021-11-17 DIAGNOSIS — R188 Other ascites: Secondary | ICD-10-CM

## 2021-11-17 LAB — CBC WITH DIFFERENTIAL/PLATELET
Abs Immature Granulocytes: 0.1 10*3/uL — ABNORMAL HIGH (ref 0.00–0.07)
Basophils Absolute: 0.1 10*3/uL (ref 0.0–0.1)
Basophils Relative: 1 %
Eosinophils Absolute: 0.2 10*3/uL (ref 0.0–0.5)
Eosinophils Relative: 2 %
HCT: 30.9 % — ABNORMAL LOW (ref 36.0–46.0)
Hemoglobin: 10.7 g/dL — ABNORMAL LOW (ref 12.0–15.0)
Immature Granulocytes: 1 %
Lymphocytes Relative: 26 %
Lymphs Abs: 2.6 10*3/uL (ref 0.7–4.0)
MCH: 35.7 pg — ABNORMAL HIGH (ref 26.0–34.0)
MCHC: 34.6 g/dL (ref 30.0–36.0)
MCV: 103 fL — ABNORMAL HIGH (ref 80.0–100.0)
Monocytes Absolute: 1.1 10*3/uL — ABNORMAL HIGH (ref 0.1–1.0)
Monocytes Relative: 11 %
Neutro Abs: 5.9 10*3/uL (ref 1.7–7.7)
Neutrophils Relative %: 59 %
Platelets: 170 10*3/uL (ref 150–400)
RBC: 3 MIL/uL — ABNORMAL LOW (ref 3.87–5.11)
RDW: 18.2 % — ABNORMAL HIGH (ref 11.5–15.5)
WBC: 10 10*3/uL (ref 4.0–10.5)
nRBC: 0 % (ref 0.0–0.2)

## 2021-11-17 LAB — AMMONIA: Ammonia: 125 umol/L — ABNORMAL HIGH (ref 9–35)

## 2021-11-17 LAB — COMPREHENSIVE METABOLIC PANEL
ALT: 46 U/L — ABNORMAL HIGH (ref 0–44)
AST: 121 U/L — ABNORMAL HIGH (ref 15–41)
Albumin: 2.4 g/dL — ABNORMAL LOW (ref 3.5–5.0)
Alkaline Phosphatase: 124 U/L (ref 38–126)
Anion gap: 8 (ref 5–15)
BUN: 33 mg/dL — ABNORMAL HIGH (ref 8–23)
CO2: 23 mmol/L (ref 22–32)
Calcium: 8.8 mg/dL — ABNORMAL LOW (ref 8.9–10.3)
Chloride: 101 mmol/L (ref 98–111)
Creatinine, Ser: 1.7 mg/dL — ABNORMAL HIGH (ref 0.44–1.00)
GFR, Estimated: 31 mL/min — ABNORMAL LOW (ref >60)
Glucose, Bld: 110 mg/dL — ABNORMAL HIGH (ref 70–99)
Potassium: 4.7 mmol/L (ref 3.5–5.1)
Sodium: 132 mmol/L — ABNORMAL LOW (ref 135–145)
Total Bilirubin: 3.6 mg/dL — ABNORMAL HIGH (ref 0.3–1.2)
Total Protein: 6.7 g/dL (ref 6.5–8.1)

## 2021-11-17 LAB — MAGNESIUM: Magnesium: 2 mg/dL (ref 1.7–2.4)

## 2021-11-17 LAB — RESP PANEL BY RT-PCR (FLU A&B, COVID) ARPGX2
Influenza A by PCR: NEGATIVE
Influenza B by PCR: NEGATIVE
SARS Coronavirus 2 by RT PCR: NEGATIVE

## 2021-11-17 LAB — BRAIN NATRIURETIC PEPTIDE: B Natriuretic Peptide: 40.7 pg/mL (ref 0.0–100.0)

## 2021-11-17 MED ORDER — METOPROLOL SUCCINATE ER 100 MG PO TB24
100.0000 mg | ORAL_TABLET | Freq: Every day | ORAL | Status: DC
  Administered 2021-11-18 – 2021-11-22 (×5): 100 mg via ORAL
  Filled 2021-11-17 (×2): qty 1
  Filled 2021-11-17: qty 2
  Filled 2021-11-17 (×2): qty 1

## 2021-11-17 MED ORDER — ACETAMINOPHEN 325 MG PO TABS
650.0000 mg | ORAL_TABLET | Freq: Four times a day (QID) | ORAL | Status: DC | PRN
  Administered 2021-11-20: 650 mg via ORAL
  Filled 2021-11-17: qty 2

## 2021-11-17 MED ORDER — ACETAMINOPHEN 650 MG RE SUPP: 650.0000 mg | Freq: Four times a day (QID) | RECTAL | Status: DC | PRN

## 2021-11-17 MED ORDER — INSULIN ASPART 100 UNIT/ML IJ SOLN
0.0000 [IU] | Freq: Three times a day (TID) | INTRAMUSCULAR | Status: DC
  Filled 2021-11-17: qty 0.06

## 2021-11-17 MED ORDER — LACTULOSE 10 GM/15ML PO SOLN
10.0000 g | Freq: Once | ORAL | Status: AC
  Administered 2021-11-17: 10 g via ORAL
  Filled 2021-11-17: qty 30

## 2021-11-17 MED ORDER — PANTOPRAZOLE SODIUM 40 MG PO TBEC
40.0000 mg | DELAYED_RELEASE_TABLET | Freq: Every day | ORAL | Status: DC
  Administered 2021-11-18 – 2021-11-22 (×5): 40 mg via ORAL
  Filled 2021-11-17 (×5): qty 1

## 2021-11-17 MED ORDER — FERROUS SULFATE 325 (65 FE) MG PO TABS
325.0000 mg | ORAL_TABLET | Freq: Every day | ORAL | Status: DC
  Administered 2021-11-18 – 2021-11-22 (×5): 325 mg via ORAL
  Filled 2021-11-17 (×5): qty 1

## 2021-11-17 MED ORDER — LEVOTHYROXINE SODIUM 100 MCG PO TABS
100.0000 ug | ORAL_TABLET | Freq: Every day | ORAL | Status: DC
  Administered 2021-11-18 – 2021-11-22 (×5): 100 ug via ORAL
  Filled 2021-11-17 (×5): qty 1

## 2021-11-17 NOTE — Telephone Encounter (Signed)
Inbound call from patient brother. States she is currently at [employer]. States patient woke up this morning with shortness of breath, weak, trouble getting out of bed, and a little jaundice.

## 2021-11-17 NOTE — ED Notes (Signed)
Pt given warm blanket.

## 2021-11-17 NOTE — ED Provider Notes (Signed)
°  Face-to-face evaluation   History: She presents for evaluation of persistent weakness, weight gain for 9 weeks, recurrence of abdominal swelling, anorexia and lower leg swelling.  She has discontinued her lisinopril and Norvasc as well as Lasix, at the instructions of her GI doctor to improve both leg swelling, and kidney function.  This is only been done within the last 3 days.  She has not noticed any improvement yet.  Physical exam: Elderly, alert cooperative.  Abdomen is mildly distended.  Is soft nontender to palpation.  Remedies with 2-3+ bilateral lower leg edema.  She does not have any dysarthria or aphasia.  Family member at the bedside states that she is somewhat more confused than usual.  MDM: Evaluation for  Chief Complaint  Patient presents with   Weakness     Elderly female, with signs and symptoms of cirrhosis, with secondary ascites and hyperammonemia.  She has been anorexic and is having trouble eating, likely related to marked abdominal ascites.  She has persistent renal insufficiency.  She may require persistent daily paracentesis to help improve her overall condition and decrease comorbidities and improve her status.  She will therefore be admitted for management.  Medical screening examination/treatment/procedure(s) were conducted as a shared visit with non-physician practitioner(s) and myself.  I personally evaluated the patient during the encounter    Daleen Bo, MD 11/18/21 1319

## 2021-11-17 NOTE — ED Provider Notes (Signed)
Ann Mcconnell   CSN: [id number] Arrival date & time: 11/17/21  1501     History  Chief Complaint  Patient presents with   Weakness    Ann Mcconnell is a 76 y.o. female with history of cirrhosis presents to ED due to generalized weakness.  Patient states that for the last 2 days she has had generalized weakness.  Patient states that 2 days ago on 2/7 she had paracentesis conducted which pulled off 4 L of fluid.  Patient states that she had significant improvement of her shortness of breath and weakness at this time however her shortness of breath and weakness has returned.  Patient being followed by GI specialist due to her cirrhosis.  Patient endorsing shortness of breath, abdominal distention.  Patient denies fevers, nausea, vomiting, diarrhea, chest pain.   Weakness Associated symptoms: shortness of breath   Associated symptoms: no chest pain, no diarrhea, no fever, no nausea and no vomiting       Home Medications Prior to Admission medications   Medication Sig Start Date End Date Taking? Authorizing Provider  Accu-Chek FastClix Lancets MISC Apply topically. 06/01/21   [provider]  ACCU-CHEK GUIDE test strip  05/27/21   [provider]  allopurinol (ZYLOPRIM) 300 MG tablet Take 300 mg by mouth daily.    [provider]  amLODipine (NORVASC) 5 MG tablet Take 5 mg by mouth daily.    [provider]  busPIRone (BUSPAR) 10 MG tablet Take 10 mg by mouth daily.    [provider]  CALCIUM PO Take 1 Dose by mouth daily.    [provider]  Cholecalciferol (VITAMIN D3 PO) Take 1 Dose by mouth daily.    [provider]  citalopram (CELEXA) 40 MG tablet Take 40 mg by mouth daily. 03/31/21   [provider]  Cyanocobalamin (VITAMIN B-12 PO) Take 1 Dose by mouth daily.    [provider]  ferrous sulfate 325 (65 FE) MG tablet Take 1 tablet (325 mg total) by  mouth daily with breakfast. 07/28/21   Armbruster, Carlota Raspberry, MD  Glucosamine HCl (GLUCOSAMINE PO) Take 1 Dose by mouth daily.    [provider]  levothyroxine (SYNTHROID) 100 MCG tablet Take 100 mcg by mouth daily. 02/09/21   [provider]  lisinopril-hydrochlorothiazide (ZESTORETIC) 20-12.5 MG tablet Take 1 tablet by mouth daily. 05/09/21   Mercy Riding, MD  metFORMIN (GLUCOPHAGE-XR) 500 MG 24 hr tablet Take 1,000 mg by mouth daily. 02/24/21   [provider]  metoprolol succinate (TOPROL-XL) 100 MG 24 hr tablet Take 100 mg by mouth daily. Take with or immediately following a meal.    [provider]  Omega-3 Fatty Acids (FISH OIL PO) Take 1 Dose by mouth daily.    [provider]  pantoprazole (PROTONIX) 40 MG tablet Take 1 tablet (40 mg total) by mouth in the morning. 06/06/21 08/05/21  Noralyn Pick, NP  pravastatin (PRAVACHOL) 40 MG tablet Take 40 mg by mouth daily.    [provider]      Allergies    Patient has no known allergies.    Review of Systems   Review of Systems  Constitutional:  Negative for fever.  Respiratory:  Positive for shortness of breath.   Cardiovascular:  Negative for chest pain.  Gastrointestinal:  Negative for diarrhea, nausea and vomiting.  Neurological:  Positive for weakness.  All other systems reviewed and are negative.  Physical Exam  Updated Vital Signs BP (!) 126/58    Pulse 87    Temp 98 F (36.7 C) (Oral)    Resp 16    Ht 5\' 6"  (1.676 m)    Wt 102.5 kg    SpO2 98%    BMI 36.48 kg/m  Physical Exam Constitutional:      General: She is not in acute distress.    Appearance: She is not ill-appearing, toxic-appearing or diaphoretic.  HENT:     Head: Normocephalic and atraumatic.     Nose: Nose normal.     Mouth/Throat:     Mouth: Mucous membranes are moist.  Eyes:     Extraocular Movements: Extraocular movements intact.     Pupils: Pupils are equal, round, and reactive to light.   Cardiovascular:     Rate and Rhythm: Normal rate and regular rhythm.  Pulmonary:     Effort: Pulmonary effort is normal.     Breath sounds: Normal breath sounds. No wheezing or rales.  Abdominal:     General: There is distension.     Tenderness: There is no abdominal tenderness.  Musculoskeletal:     Cervical back: Normal range of motion and neck supple. No tenderness.     Right lower leg: 2+ Pitting Edema present.     Left lower leg: 2+ Pitting Edema present.  Skin:    General: Skin is warm and dry.     Capillary Refill: Capillary refill takes less than 2 seconds.     Coloration: Skin is jaundiced.  Neurological:     Mental Status: She is alert. Mental status is at baseline.    ED Results / Procedures / Treatments   Labs (all labs ordered are listed, but only abnormal results are displayed) Labs Reviewed  COMPREHENSIVE METABOLIC PANEL - Abnormal; Notable for the following components:      Result Value   Sodium 132 (*)    Glucose, Bld 110 (*)    BUN 33 (*)    Creatinine, Ser 1.70 (*)    Calcium 8.8 (*)    Albumin 2.4 (*)    AST 121 (*)    ALT 46 (*)    Total Bilirubin 3.6 (*)    GFR, Estimated 31 (*)    All other components within normal limits  CBC WITH DIFFERENTIAL/PLATELET - Abnormal; Notable for the following components:   RBC 3.00 (*)    Hemoglobin 10.7 (*)    HCT 30.9 (*)    MCV 103.0 (*)    MCH 35.7 (*)    RDW 18.2 (*)    All other components within normal limits  AMMONIA - Abnormal; Notable for the following components:   Ammonia 125 (*)    All other components within normal limits  RESP PANEL BY RT-PCR (FLU A&B, COVID) ARPGX2  BRAIN NATRIURETIC PEPTIDE    EKG EKG Interpretation  Date/Time:  Thursday November 17 2021 16:40:48 EST Ventricular Rate:  82 PR Interval:  186 QRS Duration: 56 QT Interval:  502 QTC Calculation: 587 R Axis:   4 Text Interpretation: Sinus rhythm Prolonged QT interval Lead(s) V4 were not used for morphology analysis No old  tracing to compare Confirmed by Daleen Bo [phone]) on 11/17/2021 4:58:49 PM  Radiology DG Chest 2 View  Result Date: 11/17/2021 CLINICAL DATA:  Generalized weakness over the last week EXAM: CHEST - 2 VIEW COMPARISON:  None. FINDINGS: There is left ventricular prominence. There is aortic atherosclerotic calcification. The lungs are clear. No infiltrate, collapse or effusion. No  significant bone finding. IMPRESSION: No active cardiopulmonary disease. Left ventricular prominence. Aortic atherosclerotic calcification. Electronically Signed   By: Nelson Chimes M.D.   On: 11/17/2021 16:14    Procedures Procedures    Medications Ordered in ED Medications  lactulose (CHRONULAC) 10 GM/15ML solution 10 g (10 g Oral Given 11/17/21 1917)    ED Course/ Medical Decision Making/ A&P                           Medical Decision Making Amount and/or Complexity of Data Reviewed Labs: ordered. Radiology: ordered.  Risk Prescription drug management. Decision regarding hospitalization.   76 year old female presents due to generalized weakness.  Patient has diagnosis of cirrhosis, has been followed by GI specialist with recent paracentesis on 2/7.  Patient has markedly distended abdomen at this time even with paracentesis 2 days ago.  Patient also has bilateral 2+ pitting edema.  Patient worked up utilizing following labs and imaging studies that were interpreted by me: BNP, ammonia, CMP, CBC, chest x-ray, EKG BMP within normal limits Ammonia elevated at 125.  Patient has been given 10 g lactulose CMP shows slightly decreased sodium to 132, elevated creatinine 1.7.   Patient CBC shows slightly decrease hemoglobin of 10.7, this is in line with patient baseline Chest x-ray shows left ventricular prominence EKG showed sinus rhythm  At this time, due to the fact that this patient has not been eating, she has gained weight, she has no improvement with the last.  Centesis and she might be in need of another,  she cannot take diuretics due to her renal function and she was recently taken off lisinopril I feel that this patient is a strong candidate for admission and stabilization.  I have contacted the hospitalist who has agreed to admit the patient for further evaluation.  The patient is stable at time of admission.     Final Clinical Impression(s) / ED Diagnoses Final diagnoses:  Weakness  Hyperammonemia (Ann Mcconnell)  Jaundice    Rx / DC Orders ED Discharge Orders     None         Lawana Chambers 11/17/21 Kathyrn Drown    Daleen Bo, MD 11/18/21 1319

## 2021-11-17 NOTE — ED Triage Notes (Signed)
Ems brings pt in from home for generalized weakness over the past week. Reports decreased intake. Denies pain.

## 2021-11-17 NOTE — Telephone Encounter (Signed)
FYI

## 2021-11-18 ENCOUNTER — Telehealth: Payer: Self-pay

## 2021-11-18 ENCOUNTER — Other Ambulatory Visit: Payer: Self-pay

## 2021-11-18 DIAGNOSIS — E871 Hypo-osmolality and hyponatremia: Secondary | ICD-10-CM | POA: Diagnosis present

## 2021-11-18 DIAGNOSIS — R9431 Abnormal electrocardiogram [ECG] [EKG]: Secondary | ICD-10-CM | POA: Diagnosis present

## 2021-11-18 DIAGNOSIS — E039 Hypothyroidism, unspecified: Secondary | ICD-10-CM | POA: Diagnosis present

## 2021-11-18 DIAGNOSIS — R531 Weakness: Secondary | ICD-10-CM | POA: Diagnosis not present

## 2021-11-18 LAB — CBG MONITORING, ED
Glucose-Capillary: 91 mg/dL (ref 70–99)
Glucose-Capillary: 117 mg/dL — ABNORMAL HIGH (ref 70–99)
Glucose-Capillary: 113 mg/dL — ABNORMAL HIGH (ref 70–99)

## 2021-11-18 LAB — URINALYSIS, COMPLETE (UACMP) WITH MICROSCOPIC
Bilirubin Urine: NEGATIVE
Glucose, UA: NEGATIVE mg/dL
Hgb urine dipstick: NEGATIVE
Ketones, ur: NEGATIVE mg/dL
Nitrite: NEGATIVE
Protein, ur: NEGATIVE mg/dL
Specific Gravity, Urine: 1.014 (ref 1.005–1.030)
pH: 5 (ref 5.0–8.0)

## 2021-11-18 LAB — CBC WITH DIFFERENTIAL/PLATELET
Abs Immature Granulocytes: 0.04 10*3/uL (ref 0.00–0.07)
Basophils Absolute: 0.1 10*3/uL (ref 0.0–0.1)
Basophils Relative: 1 %
Eosinophils Absolute: 0.2 10*3/uL (ref 0.0–0.5)
Eosinophils Relative: 3 %
HCT: 25.3 % — ABNORMAL LOW (ref 36.0–46.0)
Hemoglobin: 8.9 g/dL — ABNORMAL LOW (ref 12.0–15.0)
Immature Granulocytes: 1 %
Lymphocytes Relative: 28 %
Lymphs Abs: 2.3 10*3/uL (ref 0.7–4.0)
MCH: 35.6 pg — ABNORMAL HIGH (ref 26.0–34.0)
MCHC: 35.2 g/dL (ref 30.0–36.0)
MCV: 101.2 fL — ABNORMAL HIGH (ref 80.0–100.0)
Monocytes Absolute: 0.9 10*3/uL (ref 0.1–1.0)
Monocytes Relative: 10 %
Neutro Abs: 4.9 10*3/uL (ref 1.7–7.7)
Neutrophils Relative %: 57 %
Platelets: 153 10*3/uL (ref 150–400)
RBC: 2.5 MIL/uL — ABNORMAL LOW (ref 3.87–5.11)
RDW: 18.4 % — ABNORMAL HIGH (ref 11.5–15.5)
WBC: 8.3 10*3/uL (ref 4.0–10.5)
nRBC: 0 % (ref 0.0–0.2)

## 2021-11-18 LAB — COMPREHENSIVE METABOLIC PANEL
ALT: 41 U/L (ref 0–44)
AST: 89 U/L — ABNORMAL HIGH (ref 15–41)
Albumin: 2.2 g/dL — ABNORMAL LOW (ref 3.5–5.0)
Alkaline Phosphatase: 104 U/L (ref 38–126)
Anion gap: 7 (ref 5–15)
BUN: 33 mg/dL — ABNORMAL HIGH (ref 8–23)
CO2: 23 mmol/L (ref 22–32)
Calcium: 8.7 mg/dL — ABNORMAL LOW (ref 8.9–10.3)
Chloride: 104 mmol/L (ref 98–111)
Creatinine, Ser: 1.66 mg/dL — ABNORMAL HIGH (ref 0.44–1.00)
GFR, Estimated: 32 mL/min — ABNORMAL LOW (ref >60)
Glucose, Bld: 99 mg/dL (ref 70–99)
Potassium: 4.3 mmol/L (ref 3.5–5.1)
Sodium: 134 mmol/L — ABNORMAL LOW (ref 135–145)
Total Bilirubin: 3.3 mg/dL — ABNORMAL HIGH (ref 0.3–1.2)
Total Protein: 6 g/dL — ABNORMAL LOW (ref 6.5–8.1)

## 2021-11-18 LAB — PROTIME-INR
INR: 1.5 — ABNORMAL HIGH (ref 0.8–1.2)
Prothrombin Time: 17.9 seconds — ABNORMAL HIGH (ref 11.4–15.2)

## 2021-11-18 LAB — GLUCOSE, CAPILLARY
Glucose-Capillary: 110 mg/dL — ABNORMAL HIGH (ref 70–99)
Glucose-Capillary: 130 mg/dL — ABNORMAL HIGH (ref 70–99)

## 2021-11-18 LAB — PHOSPHORUS: Phosphorus: 3.3 mg/dL (ref 2.5–4.6)

## 2021-11-18 LAB — OSMOLALITY, URINE: Osmolality, Ur: 478 mOsm/kg (ref 300–900)

## 2021-11-18 LAB — CREATININE, URINE, RANDOM: Creatinine, Urine: 183.87 mg/dL

## 2021-11-18 LAB — BILIRUBIN, DIRECT: Bilirubin, Direct: 1.5 mg/dL — ABNORMAL HIGH (ref 0.0–0.2)

## 2021-11-18 LAB — MAGNESIUM: Magnesium: 2 mg/dL (ref 1.7–2.4)

## 2021-11-18 LAB — SODIUM, URINE, RANDOM: Sodium, Ur: <10 mmol/L

## 2021-11-18 MED ORDER — FUROSEMIDE 10 MG/ML IJ SOLN
20.0000 mg | Freq: Every day | INTRAMUSCULAR | Status: DC
  Administered 2021-11-18 – 2021-11-19 (×2): 20 mg via INTRAVENOUS
  Filled 2021-11-18: qty 4
  Filled 2021-11-18: qty 2

## 2021-11-18 NOTE — Evaluation (Signed)
Occupational Therapy Evaluation Patient Details Name: Ann Mcconnell MRN: [id number] DOB: 1945/12/12 Today's Date: 11/18/2021   History of Present Illness Patient is a 76 year old female presenting to ED with generalized weakness. PMH: Ann Mcconnell cirrhosis, type 2 diabetes mellitus, stage IIIa chronic kidney disease with baseline creatinine 1.3-1.5   Clinical Impression   Patient lives at home, reports daughter in law is with her to assist with ADLs as needed. Patient states at baseline she was able to ambulate to bathroom with walker and toilet herself however has progressively gotten weaker needing more assist from daughter in law. Currently patient is mod +2 for bed mobility and min +2 for safety standing at edge of bed. Patient reports bilateral knees feel weak, with cues was able to side step to head of bed with walker. Recommend continued acute OT services to maximize patient independence with self care and functional transfers to reduce caregiver burden and facilitate D/C home. Patient declines wanting any HHOT.       Recommendations for follow up therapy are one component of a multi-disciplinary discharge planning process, led by the attending physician.  Recommendations may be updated based on patient status, additional functional criteria and insurance authorization.   Follow Up Recommendations  No OT follow up    Assistance Recommended at Discharge Frequent or constant Supervision/Assistance  Patient can return home with the following A little help with walking and/or transfers;A lot of help with bathing/dressing/bathroom;Assistance with cooking/housework;Assist for transportation;Help with stairs or ramp for entrance    Functional Status Assessment  Patient has had a recent decline in their functional status and demonstrates the ability to make significant improvements in function in a reasonable and predictable amount of time.  Equipment Recommendations  Tub/shower bench        Precautions / Restrictions Precautions Precautions: Fall Restrictions Weight Bearing Restrictions: No      Mobility Bed Mobility Overal bed mobility: Needs Assistance Bed Mobility: Supine to Sit, Sit to Supine     Supine to sit: Mod assist, +2 for physical assistance, +2 for safety/equipment, HOB elevated Sit to supine: Mod assist, +2 for physical assistance, +2 for safety/equipment   General bed mobility comments: Patient very stiff when trying to get out of bed needing assist to upright trunk and bring legs to edge of bed. Also needing assist to lift legs onto gurney and guide trunk to supine        Balance Overall balance assessment: Needs assistance Sitting-balance support: Feet supported Sitting balance-Leahy Scale: Fair     Standing balance support: Reliant on assistive device for balance Standing balance-Leahy Scale: Poor                             ADL either performed or assessed with clinical judgement   ADL Overall ADL's : Needs assistance/impaired Eating/Feeding: Independent;Sitting   Grooming: Set up;Sitting   Upper Body Bathing: Supervision/ safety;Set up;Sitting   Lower Body Bathing: Moderate assistance;Sitting/lateral leans;Sit to/from stand   Upper Body Dressing : Supervision/safety;Set up;Sitting   Lower Body Dressing: Total assistance;Bed level Lower Body Dressing Details (indicate cue type and reason): to don socks, patient reports DIL has been assisting her with dressing Toilet Transfer: Minimal assistance;+2 for safety/equipment Toilet Transfer Details (indicate cue type and reason): Patient min A +2 for safety in standing, reports her knees feel really weak. Able to side step to head of bed before sitting down. Toileting- Clothing Manipulation and Hygiene: Maximal assistance;Sitting/lateral lean;Sit  to/from stand Toileting - Water quality scientist Details (indicate cue type and reason): Reliant on UE support in standing      Functional mobility during ADLs: Minimal assistance;+2 for physical assistance;+2 for safety/equipment;Rolling walker (2 wheels)        Pertinent Vitals/Pain Pain Assessment Pain Assessment: No/denies pain     Hand Dominance Left   Extremity/Trunk Assessment Upper Extremity Assessment Upper Extremity Assessment: Generalized weakness   Lower Extremity Assessment Lower Extremity Assessment: Defer to PT evaluation   Cervical / Trunk Assessment Cervical / Trunk Assessment: Normal   Communication Communication Communication: No difficulties   Cognition Arousal/Alertness: Awake/alert Behavior During Therapy: WFL for tasks assessed/performed Overall Cognitive Status: Within Functional Limits for tasks assessed                                                  Home Living Family/patient expects to be discharged to:: Private residence Living Arrangements: Other (Comment) (daughter in Sports coach) Available Help at Discharge: Family Type of Home: House Home Access: Stairs to enter Technical brewer of Steps: 2 Entrance Stairs-Rails: None Home Layout: One level     Bathroom Shower/Tub: Teacher, early years/pre: Standard     Home Equipment: Rollator (4 wheels);Wheelchair - manual;Shower seat;BSC/3in1          Prior Functioning/Environment Prior Level of Function : Needs assist             Mobility Comments: Uses rollator for ambulation ADLs Comments: Patient reports daughter in law has been helping her as needed with ADLs dressing, bathing, toileting. Was able to toilet herself but has been progressively getting worse        OT Problem List: Decreased strength;Decreased activity tolerance;Impaired balance (sitting and/or standing);Obesity      OT Treatment/Interventions: Self-care/ADL training;Balance training;Patient/family education;Therapeutic activities;Therapeutic exercise    OT Goals(Current goals can be found in the care  plan section) Acute Rehab OT Goals Patient Stated Goal: Home with daughter in law support OT Goal Formulation: With patient Time For Goal Achievement: 12/02/21 Potential to Achieve Goals: Good  OT Frequency: Min 2X/week    Co-evaluation PT/OT/SLP Co-Evaluation/Treatment: Yes Reason for Co-Treatment: For patient/therapist safety;To address functional/ADL transfers PT goals addressed during session: Mobility/safety with mobility OT goals addressed during session: ADL's and self-care      AM-PAC OT "6 Clicks" Daily Activity     Outcome Measure Help from another person eating meals?: None Help from another person taking care of personal grooming?: A Little Help from another person toileting, which includes using toliet, bedpan, or urinal?: A Lot Help from another person bathing (including washing, rinsing, drying)?: A Lot Help from another person to put on and taking off regular upper body clothing?: A Little Help from another person to put on and taking off regular lower body clothing?: A Lot 6 Click Score: 16   End of Session Equipment Utilized During Treatment: Rolling walker (2 wheels) Nurse Communication: Mobility status  Activity Tolerance: Patient tolerated treatment well Patient left: in bed;with call bell/phone within reach  OT Visit Diagnosis: Other abnormalities of gait and mobility (R26.89);Unsteadiness on feet (R26.81);Muscle weakness (generalized) (M62.81)                Time: 0086-7619 OT Time Calculation (min): 26 min Charges:  OT General Charges $OT Visit: 1 Visit OT Evaluation $OT Eval Low Complexity: 1 Low  Delbert Phenix OT OT pager: [phone]   Rosemary Holms 11/18/2021, 10:25 AM

## 2021-11-18 NOTE — H&P (Signed)
History and Physical    PLEASE NOTE THAT DRAGON DICTATION SOFTWARE WAS USED IN THE CONSTRUCTION OF THIS NOTE.   TAVI GAUGHRAN ACZ:[id number] DOB: Jan 15, 1946 DOA: 11/17/2021  PCP: Maury Dus, MD  Patient coming from: home   I have personally briefly reviewed patient's old medical records in [place]  Chief Complaint: Generalized weakness  HPI: Ann Mcconnell is a 76 y.o. female with medical history significant for [name] cirrhosis, type 2 diabetes mellitus, stage IIIa chronic kidney disease with baseline creatinine 1.3-1.5, who is admitted to [facility] on 11/17/2021 with generalized weakness after presenting from home to [facility] ED complaining of such.   The patient reports 3 to 4 days of progressive generalized weakness in the absence of any acute focal weakness, acute focal numbness, paresthesias, facial droop, slurred speech, expressive aphasia, acute change in vision, dysphagia, vertigo.Denies any subjective fever, chills, rigors, or generalized myalgias. Denies any recent headache, neck stiffness, rhinitis, rhinorrhea, sore throat, sob, wheezing, cough, nausea, vomiting, abdominal pain, diarrhea, or rash. No recent traveling or known COVID-19 exposures. Denies dysuria, gross hematuria, or change in urinary urgency/frequency.  Denies any recent chest pain, diaphoresis, or palpitations.  In the setting of a history of [name] cirrhosis with recurrent ascites, it is noted that the patient underwent therapeutic paracentesis via interventional radiology 2 days ago went approximately 4.2 L of fluid was removed.  Patient reports reaccumulation of this fluid status post paracentesis, which is not unusual for her.  Denies any overt abdominal discomfort.  She follows with Dr. London Pepper Of gastroenterology to assist with management of her [name] cirrhosis.  The patient confirms that she has never previously been on Lasix or spironolactone to manage the portal hypertension associated with her  cirrhosis.     ED Course:  Vital signs in the ED were notable for the following: Afebrile; heart rate 82-87; blood pressure 124/65; respiratory rate 15-21, oxygen saturation 97 to 99% on room air.  Labs were notable for the following: CMP notable for the following: Sodium 132 compared to 133 and 1 11/15/2021, and relative to 137 October 2022, bicarbonate 23, creatinine 1.70 relative to 1.95 on 11/15/2020 and relative to 1.35 on 08/06/2019.  CBC notable for white blood cell count 10.  Calcium adjusted for mild hypoalbuminemia at 10.0, albumin 2.4, alkaline phosphatase 124, AST 121 compared 74 on 07/28/2021, ALT 46 compared to 37 on 08/05/2021, total bilirubin 3.6 compared to 3.1 on 07/28/2021.  Ammonia 125, without any prior ammonia data points available to comparison.  BNP 40.  COVID-19/influenza PCR were checked in the ED today, with results currently pending.  Imaging and additional notable ED work-up: EKG shows sinus rhythm with heart rate 82, prolonged QTc of 587, no evidence of T wave or ST changes, including evidence of ST elevation.  Chest x-ray shows no evidence of acute cardiopulmonary process, including evidence of infiltrate, edema, effusion, or pneumothorax.  While in the ED, the following were administered: Lactulose 10 g p.o. x1  Subsequently, the patient was admitted for further evaluation management of presenting generalized weakness complicated by acute hyponatremia.     Review of Systems: As per HPI otherwise 10 point review of systems negative.   Past Medical History:  Diagnosis Date   Cirrhosis ([place])    Diabetes mellitus without complication ([place])    DOE (dyspnea on exertion)    GAVE (gastric antral vascular ectasia)    Gout    HLD (hyperlipidemia)    HTN (hypertension)    Hypothyroidism  Tachycardia     Past Surgical History:  Procedure Laterality Date   Arm Surgery     BREAST REDUCTION SURGERY     CHOLECYSTECTOMY     COLONOSCOPY WITH PROPOFOL N/A 05/02/2021    Procedure: COLONOSCOPY WITH PROPOFOL;  Surgeon: Mauri Pole, MD;  Location: [facility] ENDOSCOPY;  Service: Endoscopy;  Laterality: N/A;   ESOPHAGOGASTRODUODENOSCOPY (EGD) WITH PROPOFOL N/A 05/02/2021   Procedure: ESOPHAGOGASTRODUODENOSCOPY (EGD) WITH PROPOFOL;  Surgeon: Mauri Pole, MD;  Location: [facility] ENDOSCOPY;  Service: Endoscopy;  Laterality: N/A;   HOT HEMOSTASIS N/A 05/02/2021   Procedure: HOT HEMOSTASIS (ARGON PLASMA COAGULATION/BICAP);  Surgeon: Mauri Pole, MD;  Location: [facility];  Service: Endoscopy;  Laterality: N/A;   IR PARACENTESIS  11/15/2021   REDUCTION MAMMAPLASTY      Social History:  reports that she has never smoked. She has never used smokeless tobacco. She reports that she does not drink alcohol and does not use drugs.   No Known Allergies  Family History  Problem Relation Age of Onset   Heart attack Other    Lymphoma Other    COPD Other    Thyroid disease Other    Sleep apnea Other     Family history reviewed and not pertinent    Prior to Admission medications   Medication Sig Start Date End Date Taking? Authorizing Provider  allopurinol (ZYLOPRIM) 300 MG tablet Take 150 mg by mouth daily.   Yes [provider]  busPIRone (BUSPAR) 10 MG tablet Take 10 mg by mouth daily.   Yes [provider]  citalopram (CELEXA) 40 MG tablet Take 40 mg by mouth daily. 03/31/21  Yes [provider]  ferrous sulfate 325 (65 FE) MG tablet Take 1 tablet (325 mg total) by mouth daily with breakfast. 07/28/21  Yes Armbruster, Carlota Raspberry, MD  Glucosamine HCl (GLUCOSAMINE PO) Take 1 tablet by mouth daily.   Yes [provider]  levothyroxine (SYNTHROID) 100 MCG tablet Take 100 mcg by mouth daily. 02/09/21  Yes [provider]  pravastatin (PRAVACHOL) 40 MG tablet Take 40 mg by mouth daily.   Yes [provider]  vitamin C (ASCORBIC ACID) 500 MG tablet Take 500 mg by mouth daily.   Yes [provider]   Accu-Chek FastClix Lancets MISC Apply topically. 06/01/21   [provider]  ACCU-CHEK GUIDE test strip  05/27/21   [provider]  Cholecalciferol (VITAMIN D3 PO) Take 1 Dose by mouth daily. Patient not taking: Reported on 11/17/2021    [provider]  lisinopril-hydrochlorothiazide (ZESTORETIC) 20-12.5 MG tablet Take 1 tablet by mouth daily. Patient not taking: Reported on 11/17/2021 05/09/21   Mercy Riding, MD  pantoprazole (PROTONIX) 40 MG tablet Take 1 tablet (40 mg total) by mouth in the morning. Patient not taking: Reported on 11/17/2021 06/06/21 08/05/21  Noralyn Pick, NP     Objective    Physical Exam: Vitals:   11/17/21 2215 11/17/21 2230 11/18/21 0100 11/18/21 0400  BP:   129/62 133/64  Pulse: 89 89 87 86  Resp: 15 20 14 14   Temp:      TempSrc:      SpO2: 97% 96% 97% 95%  Weight:      Height:        General: appears to be stated age; alert, oriented Skin: warm, dry, no rash Head:  AT/[place] Mouth:  Oral mucosa membranes appear moist, normal dentition Neck: supple; trachea midline Heart:  RRR; did not appreciate any M/R/G Lungs:  CTAB, did not appreciate any wheezes, rales, or rhonchi Abdomen: + BS; soft, mildly distended, NT Vascular: 2+ pedal pulses b/l; 2+ radial pulses b/l Extremities: no peripheral edema, no muscle wasting Neuro: strength and sensation intact in upper and lower extremities b/l    Labs on Admission: I have personally reviewed following labs and imaging studies  CBC: Recent Labs  Lab 11/17/21 1544 11/18/21 0455  WBC 10.0 8.3  NEUTROABS 5.9 4.9  HGB 10.7* 8.9*  HCT 30.9* 25.3*  MCV 103.0* 101.2*  PLT 170 201   Basic Metabolic Panel: Recent Labs  Lab 11/15/21 1244 11/17/21 1544 11/18/21 0455  NA 133* 132* 134*  K 4.4 4.7 4.3  CL 102 101 104  CO2 26 23 23   GLUCOSE 109* 110* 99  BUN 35* 33* 33*  CREATININE 1.95* 1.70* 1.66*  CALCIUM 9.1 8.8* 8.7*  MG  --  2.0 2.0  PHOS  --   --  3.3    GFR: Estimated Creatinine Clearance: 35.4 mL/min (A) (by C-G formula based on SCr of 1.66 mg/dL (H)). Liver Function Tests: Recent Labs  Lab 11/17/21 1544 11/18/21 0455  AST 121* 89*  ALT 46* 41  ALKPHOS 124 104  BILITOT 3.6* 3.3*  PROT 6.7 6.0*  ALBUMIN 2.4* 2.2*   No results for input(s): LIPASE, AMYLASE in the last 168 hours. Recent Labs  Lab 11/17/21 1544  AMMONIA 125*   Coagulation Profile: Recent Labs  Lab 11/18/21 0455  INR 1.5*   Cardiac Enzymes: No results for input(s): CKTOTAL, CKMB, CKMBINDEX, TROPONINI in the last 168 hours. BNP (last 3 results) No results for input(s): PROBNP in the last 8760 hours. HbA1C: No results for input(s): HGBA1C in the last 72 hours. CBG: No results for input(s): GLUCAP in the last 168 hours. Lipid Profile: No results for input(s): CHOL, HDL, LDLCALC, TRIG, CHOLHDL, LDLDIRECT in the last 72 hours. Thyroid Function Tests: No results for input(s): TSH, T4TOTAL, FREET4, T3FREE, THYROIDAB in the last 72 hours. Anemia Panel: No results for input(s): VITAMINB12, FOLATE, FERRITIN, TIBC, IRON, RETICCTPCT in the last 72 hours. Urine analysis:    Component Value Date/Time   COLORURINE AMBER (A) 11/18/2021 0600   APPEARANCEUR CLEAR 11/18/2021 0600   LABSPEC 1.014 11/18/2021 0600   PHURINE 5.0 11/18/2021 0600   GLUCOSEU NEGATIVE 11/18/2021 0600   HGBUR NEGATIVE 11/18/2021 0600   BILIRUBINUR NEGATIVE 11/18/2021 0600   KETONESUR NEGATIVE 11/18/2021 0600   PROTEINUR NEGATIVE 11/18/2021 0600   NITRITE NEGATIVE 11/18/2021 0600   LEUKOCYTESUR TRACE (A) 11/18/2021 0600    Radiological Exams on Admission: DG Chest 2 View  Result Date: 11/17/2021 CLINICAL DATA:  Generalized weakness over the last week EXAM: CHEST - 2 VIEW COMPARISON:  None. FINDINGS: There is left ventricular prominence. There is aortic atherosclerotic calcification. The lungs are clear. No infiltrate, collapse or effusion. No significant bone finding. IMPRESSION: No  active cardiopulmonary disease. Left ventricular prominence. Aortic atherosclerotic calcification. Electronically Signed   By: Nelson Chimes M.D.   On: 11/17/2021 16:14     EKG: Independently reviewed, with result as described above.    Assessment/Plan    Principal Problem:   Generalized weakness Active Problems:   Primary hypertension   Acute hyponatremia   Prolonged QT interval   Hypothyroidism      #) Generalized weakness: 3 to 4 days of progressive generalized weakness in the absence of any acute focal neurologic deficits to suggest acute ischemic CVA.  No overt evidence of underlying factious process, including chest radiograph shows  no evidence of acute cardiopulmonary process.  Also check urinalysis to further evaluate and follow for result of COVID-19/[place] PCR.  Radiograms patient for acute hyponatremia, as further detailed below.  Plan: Fall precautions ordered.  PT/OT consults been ordered for the morning.  Check MMA, TSH, urinalysis, and follow for results of COVID-19/influenza PCR.  Further evaluation management of acute hyponatremia, as further detailed below.       #) Acute hypoosmolar hyponatremia: Serum sodium 132 compared to 133 on 11/15/2021, and relative to 137 on October 2022.  Suspect that this is hypervolemic in nature in the setting of cirrhosis.  Patient may benefit from initiation of Lasix/spironolactone as medical management of her portal hypertension in this setting, noting that she has not previously been on either of these medications, per the patient's conveyance.  There may also be a pharmacologic exacerbation in setting of HCTZ as well as Celexa.  Plan: Hold home Celexa and HCTZ for now.  Monitor strict I's and O's and daily weights.  Add on random urine sodium as well as random urine creatinine.  Check serum osmolality, urine osmolality.  Check urinalysis.  Repeat BMP in the morning.  Consideration for initiation of diuretic medications, as above.  Check  TSH.        #) Resolving acute kidney injury superimposed on stage IIIa CKD: In the context of a documented history of stage IIIa CKD with baseline creatinine 1.3-1.5, presenting creatinine is 1.70, although this value is trending down from most recent prior value 1.95 on 11/15/2021.  This is in the context of the patient's report of recent decline in oral intake, which appears to support an overall picture of hypervolemia given improving renal function with diminished oral intake of water.   Plan: Check urinalysis with microscopy.  Add on random insulin as well as random urine creatinine.  Repeat BMP in the morning.       #) Prolonged QTc: Presenting EKG reflects QTc of 587 MS.  Potential pharmacologic attributions from Celexa as above.  Plan: Repeat EKG in the morning to trend interval QTc prolongation.  Monitor on telemetry.  Add on serum magnesium level.         #) NASH cirrhosis: Documented history of such, unable to calculate MELD score at this time in the absence of INR.  Follows with Dr. London Pepper Of gastroenterology, as above.  May benefit from initiation of Lasix/spironolactone as compared to medical management of portal hypertension given recurrent ascites, as above.  Presenting ammonia 125, with clinical significance of such unclear, as this is an isolated value, without any prior data points available for point comparison.  Therefore, given pneumonia and of itself is not sensitive or specific for hepatic encephalopathy, will refrain from continuation of lactulose given that the patient is alert and oriented x4 at this time.   Plan: Check INR.  CMP in the morning.  Check direct bilirubin.  Consideration of initiation of diuretics, as above.        #) Essential Hypertension: On Norvasc, HCTZ, Toprol-XL as an outpatient.  Presenting blood pressure was found to be normotensive.  Plan: Continue home Norvasc and Toprol-XL.  We will hold home HCTZ in the context of acute  hyponatremia, as above.  Close monitoring of ensuing blood pressure via routine vital signs.        #) Acquired hypothyroidism: Documented history of such, on Synthroid as outpatient.  In the setting of presenting acute hyponatremia, will check TSH.  Plan: Check TSH, as above.  Continue  home Synthroid.       DVT prophylaxis: SCD's   Code Status: Full code Family Communication: none Disposition Plan: Per Rounding Team Consults called: none;  Admission status: Observation   PLEASE NOTE THAT DRAGON DICTATION SOFTWARE WAS USED IN THE CONSTRUCTION OF THIS NOTE.   [place] DO [facility] [place]   11/18/2021, 6:53 AM

## 2021-11-18 NOTE — Telephone Encounter (Signed)
Patient is currently admitted. Ann Mcconnell is aware. See 11/17/21 phone note.

## 2021-11-18 NOTE — Telephone Encounter (Signed)
-----   Message from Yevette Edwards, RN sent at 11/15/2021  4:33 PM EST ----- Regarding: Labs and x-ray BMET, order in epic.  Chest x-ray today.

## 2021-11-18 NOTE — ED Notes (Signed)
PT working with patient at this time.  

## 2021-11-18 NOTE — Evaluation (Signed)
Physical Therapy Evaluation Patient Details Name: Ann Mcconnell MRN: [id number] DOB: [birth year] Today's Date: 11/18/2021  History of Present Illness  Patient is a 76 year old female presenting to ED with generalized weakness. PMH: [name] cirrhosis, type 2 diabetes mellitus, stage IIIa chronic kidney disease with baseline creatinine 1.3-1.5  Clinical Impression  Pt admitted as above and presenting with functional mobility limitations 2* generalized weakness, balance deficits, obesity, and very limited endurance.  Pt hopes to progress to dc home with 24/7 assist of family (DIL has temporarily moved in) and would benefit from follow up HHPT to maximize IND and safety.     Recommendations for follow up therapy are one component of a multi-disciplinary discharge planning process, led by the attending physician.  Recommendations may be updated based on patient status, additional functional criteria and insurance authorization.  Follow Up Recommendations Home health PT    Assistance Recommended at Discharge Frequent or constant Supervision/Assistance  Patient can return home with the following  A lot of help with walking and/or transfers;A lot of help with bathing/dressing/bathroom;Assistance with cooking/housework;Assist for transportation;Help with stairs or ramp for entrance    Equipment Recommendations None recommended by PT  Recommendations for Other Services       Functional Status Assessment Patient has had a recent decline in their functional status and demonstrates the ability to make significant improvements in function in a reasonable and predictable amount of time.     Precautions / Restrictions Precautions Precautions: Fall Restrictions Weight Bearing Restrictions: No      Mobility  Bed Mobility Overal bed mobility: Needs Assistance Bed Mobility: Supine to Sit, Sit to Supine     Supine to sit: Mod assist, +2 for physical assistance, +2 for safety/equipment, HOB  elevated Sit to supine: Mod assist, +2 for physical assistance, +2 for safety/equipment   General bed mobility comments: Patient very stiff when trying to get out of bed needing assist to upright trunk and bring legs to edge of bed. Also needing assist to lift legs onto gurney and guide trunk to supine    Transfers Overall transfer level: Needs assistance Equipment used: Rolling walker (2 wheels) Transfers: Sit to/from Stand Sit to Stand: Min assist, +2 physical assistance, +2 safety/equipment, From elevated surface           General transfer comment: assist to bring wt up and fwd and to balance in initial standing with RW    Ambulation/Gait Ambulation/Gait assistance: Min assist, +2 physical assistance, +2 safety/equipment Gait Distance (Feet): 3 Feet Assistive device: Rolling walker (2 wheels) Gait Pattern/deviations: Step-to pattern, Decreased step length - right, Decreased step length - left, Shuffle, Trunk flexed Gait velocity: decr     General Gait Details: Pt side-stepped up side of bed only.  Distance ltd by pt weakness, "I'm not sure my knees will hold me"  Stairs            Wheelchair Mobility    Modified Rankin (Stroke Patients Only)       Balance Overall balance assessment: Needs assistance Sitting-balance support: Feet supported Sitting balance-Leahy Scale: Fair     Standing balance support: Reliant on assistive device for balance Standing balance-Leahy Scale: Poor                               Pertinent Vitals/Pain Pain Assessment Pain Assessment: No/denies pain    Home Living Family/patient expects to be discharged to:: Private residence Living Arrangements: Other (Comment)  Available Help at Discharge: Family Type of Home: House Home Access: Stairs to enter Entrance Stairs-Rails: None Entrance Stairs-Number of Steps: 2   Home Layout: One level Home Equipment: Rollator (4 wheels);Wheelchair - manual;Shower seat;BSC/3in1       Prior Function Prior Level of Function : Needs assist             Mobility Comments: Uses rollator for ambulation ADLs Comments: Patient reports daughter in law has been helping her as needed with ADLs dressing, bathing, toileting. Was able to toilet herself but has been progressively getting worse     Hand Dominance   Dominant Hand: Left    Extremity/Trunk Assessment   Upper Extremity Assessment Upper Extremity Assessment: Generalized weakness    Lower Extremity Assessment Lower Extremity Assessment: Generalized weakness    Cervical / Trunk Assessment Cervical / Trunk Assessment: Normal  Communication   Communication: No difficulties  Cognition Arousal/Alertness: Awake/alert Behavior During Therapy: WFL for tasks assessed/performed Overall Cognitive Status: Within Functional Limits for tasks assessed                                          General Comments      Exercises     Assessment/Plan    PT Assessment Patient needs continued PT services  PT Problem List Decreased strength;Decreased activity tolerance;Decreased balance;Decreased mobility;Decreased knowledge of use of DME;Obesity       PT Treatment Interventions DME instruction;Gait training;Stair training;Functional mobility training;Therapeutic activities;Therapeutic exercise;Balance training;Patient/family education    PT Goals (Current goals can be found in the Care Plan section)  Acute Rehab PT Goals Patient Stated Goal: Regain IND PT Goal Formulation: With patient Time For Goal Achievement: 06/24/22 Potential to Achieve Goals: Fair    Frequency Min 3X/week     Co-evaluation PT/OT/SLP Co-Evaluation/Treatment: Yes Reason for Co-Treatment: For patient/therapist safety PT goals addressed during session: Mobility/safety with mobility OT goals addressed during session: ADL's and self-care       AM-PAC PT "6 Clicks" Mobility  Outcome Measure Help needed turning from  your back to your side while in a flat bed without using bedrails?: A Lot Help needed moving from lying on your back to sitting on the side of a flat bed without using bedrails?: A Lot Help needed moving to and from a bed to a chair (including a wheelchair)?: A Lot Help needed standing up from a chair using your arms (e.g., wheelchair or bedside chair)?: A Lot Help needed to walk in hospital room?: Total Help needed climbing 3-5 steps with a railing? : Total 6 Click Score: 10    End of Session Equipment Utilized During Treatment: Gait belt Activity Tolerance: Patient limited by fatigue Patient left: in bed;with call bell/phone within reach Nurse Communication: Mobility status PT Visit Diagnosis: Unsteadiness on feet (R26.81);Muscle weakness (generalized) (M62.81);Difficulty in walking, not elsewhere classified (R26.2)    Time: 1410-3013 PT Time Calculation (min) (ACUTE ONLY): 25 min   Charges:   PT Evaluation $PT Eval Low Complexity: 1 Low          [place] Pager [phone] Office [phone]   Avyay Coger 11/18/2021, 11:55 AM

## 2021-11-18 NOTE — Progress Notes (Signed)
Chief Complaint: Generalized weakness  HPI: Ann Mcconnell is a 75 y.o. female with medical history significant for [name] cirrhosis, type 2 diabetes mellitus, stage IIIa chronic kidney disease with baseline creatinine 1.3-1.5, who is admitted to [facility] on 11/17/2021 with generalized weakness after presenting from home to [facility] ED complaining of such.   The patient reports 3 to 4 days of progressive generalized weakness in the absence of any acute focal weakness, acute focal numbness, paresthesias, facial droop, slurred speech, expressive aphasia, acute change in vision, dysphagia, vertigo.Denies any subjective fever, chills, rigors, or generalized myalgias. Denies any recent headache, neck stiffness, rhinitis, rhinorrhea, sore throat, sob, wheezing, cough, nausea, vomiting, abdominal pain, diarrhea, or rash. No recent traveling or known COVID-19 exposures. Denies dysuria, gross hematuria, or change in urinary urgency/frequency.  Denies any recent chest pain, diaphoresis, or palpitations.  In the setting of a history of [name] cirrhosis with recurrent ascites, it is noted that the patient underwent therapeutic paracentesis via interventional radiology 2 days ago went approximately 4.2 L of fluid was removed.  Patient reports reaccumulation of this fluid status post paracentesis, which is not unusual for her.  Denies any overt abdominal discomfort.  She follows with Dr. London Pepper Of gastroenterology to assist with management of her [name] cirrhosis.  The patient confirms that she has never previously been on Lasix or spironolactone to manage the portal hypertension associated with her cirrhosis.   ED Course:  Vital signs in the ED were notable for the following: Afebrile; heart rate 82-87; blood pressure 124/65; respiratory rate 15-21, oxygen saturation 97 to 99% on room air.  Labs were notable for the following: CMP notable for the following: Sodium 132 compared to 133 and 1 11/15/2021, and  relative to 137 October 2022, bicarbonate 23, creatinine 1.70 relative to 1.95 on 11/15/2020 and relative to 1.35 on 08/06/2019.  CBC notable for white blood cell count 10.  Calcium adjusted for mild hypoalbuminemia at 10.0, albumin 2.4, alkaline phosphatase 124, AST 121 compared 74 on 07/28/2021, ALT 46 compared to 37 on 08/05/2021, total bilirubin 3.6 compared to 3.1 on 07/28/2021.  Ammonia 125, without any prior ammonia data points available to comparison.  BNP 40.  COVID-19/influenza PCR were checked in the ED today, with results currently pending.  Imaging and additional notable ED work-up: EKG shows sinus rhythm with heart rate 82, prolonged QTc of 587, no evidence of T wave or ST changes, including evidence of ST elevation.  Chest x-ray shows no evidence of acute cardiopulmonary process, including evidence of infiltrate, edema, effusion, or pneumothorax.  While in the ED, the following were administered: Lactulose 10 g p.o. x1  Subsequently, the patient was admitted for further evaluation management of presenting generalized weakness complicated by acute hyponatremia.  Subjective Patient reports failure to thrive for over 3 months.  Has never been on Lasix.  Has been having swelling persistent again for over 3 months.    Objective    Physical Exam: Vitals:   11/18/21 0100 11/18/21 0400 11/18/21 0700 11/18/21 0746  BP: 129/62 133/64  (!) 143/68  Pulse: 87 86 88 86  Resp: 14 14 14 15   Temp:      TempSrc:      SpO2: 97% 95% 97% 98%  Weight:      Height:        General: appears to be stated age; alert, oriented Skin: warm, dry, no rash Head:  AT/[place] Mouth:  Oral mucosa membranes appear moist, normal dentition Neck: supple; trachea  midline Heart:  RRR; did not appreciate any M/R/G Lungs: CTAB, did not appreciate any wheezes, rales, or rhonchi Abdomen: + BS; soft, mildly distended, NT Vascular: 2+ pedal pulses b/l; 2+ radial pulses b/l Extremities: 1-2+ peripheral edema, no muscle  wasting Neuro: strength and sensation intact in upper and lower extremities b/l    Labs on Admission: I have personally reviewed following labs and imaging studies  CBC: Recent Labs  Lab 11/17/21 1544 11/18/21 0455  WBC 10.0 8.3  NEUTROABS 5.9 4.9  HGB 10.7* 8.9*  HCT 30.9* 25.3*  MCV 103.0* 101.2*  PLT 170 388   Basic Metabolic Panel: Recent Labs  Lab 11/15/21 1244 11/17/21 1544 11/18/21 0455  NA 133* 132* 134*  K 4.4 4.7 4.3  CL 102 101 104  CO2 26 23 23   GLUCOSE 109* 110* 99  BUN 35* 33* 33*  CREATININE 1.95* 1.70* 1.66*  CALCIUM 9.1 8.8* 8.7*  MG  --  2.0 2.0  PHOS  --   --  3.3   GFR: Estimated Creatinine Clearance: 35.4 mL/min (A) (by C-G formula based on SCr of 1.66 mg/dL (H)). Liver Function Tests: Recent Labs  Lab 11/17/21 1544 11/18/21 0455  AST 121* 89*  ALT 46* 41  ALKPHOS 124 104  BILITOT 3.6* 3.3*  PROT 6.7 6.0*  ALBUMIN 2.4* 2.2*   No results for input(s): LIPASE, AMYLASE in the last 168 hours. Recent Labs  Lab 11/17/21 1544  AMMONIA 125*   Coagulation Profile: Recent Labs  Lab 11/18/21 0455  INR 1.5*   Cardiac Enzymes: No results for input(s): CKTOTAL, CKMB, CKMBINDEX, TROPONINI in the last 168 hours. BNP (last 3 results) No results for input(s): PROBNP in the last 8760 hours. HbA1C: No results for input(s): HGBA1C in the last 72 hours. CBG: Recent Labs  Lab 11/18/21 0743  GLUCAP 91   Lipid Profile: No results for input(s): CHOL, HDL, LDLCALC, TRIG, CHOLHDL, LDLDIRECT in the last 72 hours. Thyroid Function Tests: No results for input(s): TSH, T4TOTAL, FREET4, T3FREE, THYROIDAB in the last 72 hours. Anemia Panel: No results for input(s): VITAMINB12, FOLATE, FERRITIN, TIBC, IRON, RETICCTPCT in the last 72 hours. Urine analysis:    Component Value Date/Time   COLORURINE AMBER (A) 11/18/2021 0600   APPEARANCEUR CLEAR 11/18/2021 0600   LABSPEC 1.014 11/18/2021 0600   PHURINE 5.0 11/18/2021 0600   GLUCOSEU NEGATIVE  11/18/2021 0600   HGBUR NEGATIVE 11/18/2021 0600   BILIRUBINUR NEGATIVE 11/18/2021 0600   KETONESUR NEGATIVE 11/18/2021 0600   PROTEINUR NEGATIVE 11/18/2021 0600   NITRITE NEGATIVE 11/18/2021 0600   LEUKOCYTESUR TRACE (A) 11/18/2021 0600    Radiological Exams on Admission: DG Chest 2 View  Result Date: 11/17/2021 CLINICAL DATA:  Generalized weakness over the last week EXAM: CHEST - 2 VIEW COMPARISON:  None. FINDINGS: There is left ventricular prominence. There is aortic atherosclerotic calcification. The lungs are clear. No infiltrate, collapse or effusion. No significant bone finding. IMPRESSION: No active cardiopulmonary disease. Left ventricular prominence. Aortic atherosclerotic calcification. Electronically Signed   By: Nelson Chimes M.D.   On: 11/17/2021 16:14     EKG: Independently reviewed, with result as described above.    Assessment/Plan    Principal Problem:   Generalized weakness Active Problems:   Primary hypertension   Acute hyponatremia   Prolonged QT interval   Hypothyroidism      #) Generalized weakness, failure to thrive: 3 to 4 months of progressive generalized weakness in the absence of any acute focal neurologic deficits to suggest acute  ischemic CVA.  No overt evidence of underlying factious process, including chest radiograph shows no evidence of acute cardiopulmonary process.  Also check urinalysis to further evaluate and follow for result of COVID-19/[place] PCR.    Plan: Fall precautions ordered.  PT/OT consults been ordered for the morning.  Doubt symptoms are due to hyponatremia with a sodium level of 133.  More likely due to advancing of her liver disease which is chronically decompensated.    #)   hyponatremia: Serum sodium 132 compared to 133 on 11/15/2021, and relative to 137 on October 2022.  Suspect that this is hypervolemic in nature in the setting of cirrhosis.  Patient may benefit from initiation of Lasix/spironolactone as medical management of  her portal hypertension in this setting, noting that she has not previously been on either of these medications, per the patient's conveyance.  There may also be a pharmacologic exacerbation in setting of HCTZ as well as Celexa.  Plan: Hold home Celexa and HCTZ for now.  Monitor strict I's and O's and daily weights.  Add on random urine sodium as well as random urine creatinine.  Check serum osmolality, urine osmolality.  Check urinalysis.  Repeat BMP in the morning.  Consideration for initiation of diuretic medications, as above.  Check TSH.   #) Resolving acute kidney injury superimposed on stage IIIa CKD: In the context of a documented history of stage IIIa CKD with baseline creatinine 1.3-1.5, presenting creatinine is 1.70, although this value is trending down from most recent prior value 1.95 on 11/15/2021.  This is in the context of the patient's report of recent decline in oral intake, which appears to support an overall picture of hypervolemia given improving renal function with diminished oral intake of water.   Plan: Check urinalysis with microscopy.  Add on random insulin as well as random urine creatinine.  Repeat BMP in the morning.  #) Prolonged QTc: Presenting EKG reflects QTc of 587 MS.  Potential pharmacologic attributions from Celexa as above.  Plan: Repeat EKG in the morning to trend interval QTc prolongation.  Monitor on telemetry.  Mag normal  #) NASH cirrhosis: Documented history of such, unable to calculate MELD score at this time in the absence of INR.  Follows with Dr. London Pepper Of gastroenterology, as above.  May benefit from initiation of Lasix/spironolactone as compared to medical management of portal hypertension given recurrent ascites, as above.  Presenting ammonia 125, with clinical significance of such unclear, as this is an isolated value, without any prior data points available for point comparison.  Therefore, given ammonia and of itself is not sensitive or specific for  hepatic encephalopathy, will refrain from continuation of lactulose given that the patient is alert and oriented x4 at this time.  Plan: 1.5  INR.  CMP in the morning.  Check direct bilirubin.  Start Lasix 20 mg IV daily and see how she responds to diuresis add a low-dose  #) Essential Hypertension: On Norvasc, HCTZ, Toprol-XL as an outpatient.  Presenting blood pressure was found to be normotensive.  Plan: Continue home Norvasc and Toprol-XL.  We will hold home HCTZ in the context of acute hyponatremia, as above.  Close monitoring of ensuing blood pressure via routine vital signs.   #) Acquired hypothyroidism: Documented history of such, on Synthroid as outpatient.  In the setting of presenting acute hyponatremia, will check TSH.  Plan: Check TSH, as above.  Continue home Synthroid.   PT evaluation pending.  Highly likely to need short-term rehab.  Edwing Figley A DO [facility] [place]   11/18/2021, 9:42 AM

## 2021-11-19 ENCOUNTER — Encounter (HOSPITAL_COMMUNITY): Payer: Self-pay | Admitting: Internal Medicine

## 2021-11-19 DIAGNOSIS — R0602 Shortness of breath: Secondary | ICD-10-CM | POA: Diagnosis present

## 2021-11-19 DIAGNOSIS — E1122 Type 2 diabetes mellitus with diabetic chronic kidney disease: Secondary | ICD-10-CM | POA: Diagnosis present

## 2021-11-19 DIAGNOSIS — E669 Obesity, unspecified: Secondary | ICD-10-CM | POA: Diagnosis present

## 2021-11-19 DIAGNOSIS — Z79890 Hormone replacement therapy (postmenopausal): Secondary | ICD-10-CM | POA: Diagnosis not present

## 2021-11-19 DIAGNOSIS — K766 Portal hypertension: Secondary | ICD-10-CM | POA: Diagnosis present

## 2021-11-19 DIAGNOSIS — M109 Gout, unspecified: Secondary | ICD-10-CM | POA: Diagnosis present

## 2021-11-19 DIAGNOSIS — R627 Adult failure to thrive: Secondary | ICD-10-CM | POA: Diagnosis present

## 2021-11-19 DIAGNOSIS — D689 Coagulation defect, unspecified: Secondary | ICD-10-CM | POA: Diagnosis present

## 2021-11-19 DIAGNOSIS — N1831 Chronic kidney disease, stage 3a: Secondary | ICD-10-CM | POA: Diagnosis present

## 2021-11-19 DIAGNOSIS — K7682 Hepatic encephalopathy: Secondary | ICD-10-CM | POA: Diagnosis present

## 2021-11-19 DIAGNOSIS — E8770 Fluid overload, unspecified: Secondary | ICD-10-CM | POA: Diagnosis not present

## 2021-11-19 DIAGNOSIS — Z20822 Contact with and (suspected) exposure to covid-19: Secondary | ICD-10-CM | POA: Diagnosis present

## 2021-11-19 DIAGNOSIS — K7581 Nonalcoholic steatohepatitis (NASH): Secondary | ICD-10-CM | POA: Diagnosis present

## 2021-11-19 DIAGNOSIS — I129 Hypertensive chronic kidney disease with stage 1 through stage 4 chronic kidney disease, or unspecified chronic kidney disease: Secondary | ICD-10-CM | POA: Diagnosis present

## 2021-11-19 DIAGNOSIS — E785 Hyperlipidemia, unspecified: Secondary | ICD-10-CM | POA: Diagnosis present

## 2021-11-19 DIAGNOSIS — E039 Hypothyroidism, unspecified: Secondary | ICD-10-CM | POA: Diagnosis present

## 2021-11-19 DIAGNOSIS — Z6838 Body mass index (BMI) 38.0-38.9, adult: Secondary | ICD-10-CM | POA: Diagnosis not present

## 2021-11-19 DIAGNOSIS — Z7984 Long term (current) use of oral hypoglycemic drugs: Secondary | ICD-10-CM | POA: Diagnosis not present

## 2021-11-19 DIAGNOSIS — R531 Weakness: Secondary | ICD-10-CM | POA: Diagnosis not present

## 2021-11-19 DIAGNOSIS — E871 Hypo-osmolality and hyponatremia: Secondary | ICD-10-CM | POA: Diagnosis present

## 2021-11-19 DIAGNOSIS — R188 Other ascites: Secondary | ICD-10-CM | POA: Diagnosis present

## 2021-11-19 DIAGNOSIS — K7460 Unspecified cirrhosis of liver: Secondary | ICD-10-CM | POA: Diagnosis present

## 2021-11-19 DIAGNOSIS — Z79899 Other long term (current) drug therapy: Secondary | ICD-10-CM | POA: Diagnosis not present

## 2021-11-19 LAB — GLUCOSE, CAPILLARY
Glucose-Capillary: 84 mg/dL (ref 70–99)
Glucose-Capillary: 96 mg/dL (ref 70–99)
Glucose-Capillary: 126 mg/dL — ABNORMAL HIGH (ref 70–99)
Glucose-Capillary: 118 mg/dL — ABNORMAL HIGH (ref 70–99)

## 2021-11-19 LAB — AMMONIA: Ammonia: 85 umol/L — ABNORMAL HIGH (ref 9–35)

## 2021-11-19 MED ORDER — RIFAXIMIN 550 MG PO TABS
550.0000 mg | ORAL_TABLET | Freq: Two times a day (BID) | ORAL | Status: DC
  Administered 2021-11-19 – 2021-11-22 (×7): 550 mg via ORAL
  Filled 2021-11-19 (×7): qty 1

## 2021-11-19 MED ORDER — LACTULOSE 10 GM/15ML PO SOLN
20.0000 g | Freq: Three times a day (TID) | ORAL | Status: DC
  Administered 2021-11-19 – 2021-11-22 (×9): 20 g via ORAL
  Filled 2021-11-19 (×9): qty 30

## 2021-11-19 MED ORDER — FUROSEMIDE 10 MG/ML IJ SOLN
20.0000 mg | Freq: Once | INTRAMUSCULAR | Status: AC
  Administered 2021-11-19: 20 mg via INTRAVENOUS
  Filled 2021-11-19: qty 2

## 2021-11-19 MED ORDER — FUROSEMIDE 10 MG/ML IJ SOLN: 40.0000 mg | Freq: Once | INTRAMUSCULAR | Status: DC

## 2021-11-19 NOTE — Progress Notes (Addendum)
Progress Note    Ann Mcconnell  DDU:[id number] DOB: 11-27-1945  DOA: 11/17/2021 PCP: Maury Dus, MD      Brief Narrative:    Medical records reviewed and are as summarized below:  Ann Mcconnell is a 76 y.o. female with medical history significant for [facility] liver cirrhosis, type II DM, CKD stage IIIa, hypothyroidism, who presented to the hospital because of generalized weakness.   Assessment/Plan:   Principal Problem:   Generalized weakness Active Problems:   Primary hypertension   Acute hyponatremia   Prolonged QT interval   Hypothyroidism   Acute hepatic encephalopathy   Body mass index is 38.96 kg/m.  (Obesity)   NASH Liver cirrhosis with hepatic encephalopathy, ascites, anasarca, coagulopathy: Ammonia level was 123 on admission.  Start lactulose and rifaximin.  Continue IV Lasix and Aldactone.  AKI on CKD stage IIIa: Monitor BMP  Hyponatremia: Improved.  Monitor BMP  Generalized weakness: PT and OT recommend home health therapy  Prolonged QTc interval: Down from 587-578   Diet Order             Diet regular Room service appropriate? Yes; Fluid consistency: Thin  Diet effective now                    Consultants: None  Procedures: None    Medications:    ferrous sulfate  325 mg Oral Q breakfast   furosemide  20 mg Intravenous Once   insulin aspart  0-6 Units Subcutaneous TID WC   lactulose  20 g Oral TID   levothyroxine  100 mcg Oral Daily   metoprolol succinate  100 mg Oral Daily   pantoprazole  40 mg Oral Daily   Continuous Infusions:   Anti-infectives (From admission, onward)    None              Family Communication/Anticipated D/C date and plan/Code Status   DVT prophylaxis: SCDs Start: 11/17/21 1929     Code Status: Full Code  Family Communication: Plan discussed with Peggy, sister, at the bedside Disposition Plan: Plan to discharge home when medically stable   Status is: Observation The  patient will require care spanning > 2 midnights and should be moved to inpatient because: Altered mental status             Subjective:   Interval events ment is noted.  She is confused.  Peggy, sister, was at the bedside  Objective:    Vitals:   11/18/21 1740 11/18/21 2137 11/19/21 0134 11/19/21 0505  BP:  (!) 109/57 (!) 107/53 111/60  Pulse:  60 63 61  Resp:  16 18 18   Temp:  98.6 F (37 C) 98.2 F (36.8 C) 98.6 F (37 C)  TempSrc:  Oral Oral Oral  SpO2:  97% 100% 100%  Weight: 110 kg   109.5 kg  Height: 5\' 6"  (1.676 m)      No data found.   Intake/Output Summary (Last 24 hours) at 11/19/2021 1309 Last data filed at 11/19/2021 1100 Gross per 24 hour  Intake 240 ml  Output --  Net 240 ml   Filed Weights   11/17/21 1525 11/18/21 1740 11/19/21 0505  Weight: 102.5 kg 110 kg 109.5 kg    Exam:  GEN: NAD SKIN: No rash EYES: EOMI ENT: MMM CV: RRR PULM: CTA B ABD: soft, distended, NT, +BS CNS: AAO x 1 (person), flapping tremor of b/l hands, non focal EXT: B/l leg edema, no erythema or  tenderness        Data Reviewed:   I have personally reviewed following labs and imaging studies:  Labs: Labs show the following:   Basic Metabolic Panel: Recent Labs  Lab 11/15/21 1244 11/17/21 1544 11/18/21 0455  NA 133* 132* 134*  K 4.4 4.7 4.3  CL 102 101 104  CO2 26 23 23   GLUCOSE 109* 110* 99  BUN 35* 33* 33*  CREATININE 1.95* 1.70* 1.66*  CALCIUM 9.1 8.8* 8.7*  MG  --  2.0 2.0  PHOS  --   --  3.3   GFR Estimated Creatinine Clearance: 36.7 mL/min (A) (by C-G formula based on SCr of 1.66 mg/dL (H)). Liver Function Tests: Recent Labs  Lab 11/17/21 1544 11/18/21 0455  AST 121* 89*  ALT 46* 41  ALKPHOS 124 104  BILITOT 3.6* 3.3*  PROT 6.7 6.0*  ALBUMIN 2.4* 2.2*   No results for input(s): LIPASE, AMYLASE in the last 168 hours. Recent Labs  Lab 11/17/21 1544  AMMONIA 125*   Coagulation profile Recent Labs  Lab 11/18/21 0455  INR  1.5*    CBC: Recent Labs  Lab 11/17/21 1544 11/18/21 0455  WBC 10.0 8.3  NEUTROABS 5.9 4.9  HGB 10.7* 8.9*  HCT 30.9* 25.3*  MCV 103.0* 101.2*  PLT 170 153   Cardiac Enzymes: No results for input(s): CKTOTAL, CKMB, CKMBINDEX, TROPONINI in the last 168 hours. BNP (last 3 results) No results for input(s): PROBNP in the last 8760 hours. CBG: Recent Labs  Lab 11/18/21 1138 11/18/21 1744 11/18/21 2134 11/19/21 0756 11/19/21 1215  GLUCAP 113* 110* 130* 84 96   D-Dimer: No results for input(s): DDIMER in the last 72 hours. Hgb A1c: No results for input(s): HGBA1C in the last 72 hours. Lipid Profile: No results for input(s): CHOL, HDL, LDLCALC, TRIG, CHOLHDL, LDLDIRECT in the last 72 hours. Thyroid function studies: No results for input(s): TSH, T4TOTAL, T3FREE, THYROIDAB in the last 72 hours.  Invalid input(s): FREET3 Anemia work up: No results for input(s): VITAMINB12, FOLATE, FERRITIN, TIBC, IRON, RETICCTPCT in the last 72 hours. Sepsis Labs: Recent Labs  Lab 11/17/21 1544 11/18/21 0455  WBC 10.0 8.3    Microbiology Recent Results (from the past 240 hour(s))  Resp Panel by RT-PCR (Flu A&B, Covid) Nasopharyngeal Swab     Status: None   Collection Time: 11/17/21  7:53 PM   Specimen: Nasopharyngeal Swab; Nasopharyngeal(NP) swabs in vial transport medium  Result Value Ref Range Status   SARS Coronavirus 2 by RT PCR NEGATIVE NEGATIVE Final    Comment: (NOTE) SARS-CoV-2 target nucleic acids are NOT DETECTED.  The SARS-CoV-2 RNA is generally detectable in upper respiratory specimens during the acute phase of infection. The lowest concentration of SARS-CoV-2 viral copies this assay can detect is 138 copies/mL. A negative result does not preclude SARS-Cov-2 infection and should not be used as the sole basis for treatment or other patient management decisions. A negative result may occur with  improper specimen collection/handling, submission of specimen other than  nasopharyngeal swab, presence of viral mutation(s) within the areas targeted by this assay, and inadequate number of viral copies(<138 copies/mL). A negative result must be combined with clinical observations, patient history, and epidemiological information. The expected result is Negative.  Fact Sheet for Patients:  EntrepreneurPulse.com.au  Fact Sheet for Healthcare Providers:  IncredibleEmployment.be  This test is no t yet approved or cleared by the [place] FDA and  has been authorized for detection and/or diagnosis of SARS-CoV-2 by FDA under  an Emergency Use Authorization (EUA). This EUA will remain  in effect (meaning this test can be used) for the duration of the COVID-19 declaration under Section 564(b)(1) of the Act, 21 U.S.C.section 360bbb-3(b)(1), unless the authorization is terminated  or revoked sooner.       Influenza A by PCR NEGATIVE NEGATIVE Final   Influenza B by PCR NEGATIVE NEGATIVE Final    Comment: (NOTE) The Xpert Xpress SARS-CoV-2/FLU/RSV plus assay is intended as an aid in the diagnosis of influenza from Nasopharyngeal swab specimens and should not be used as a sole basis for treatment. Nasal washings and aspirates are unacceptable for Xpert Xpress SARS-CoV-2/FLU/RSV testing.  Fact Sheet for Patients: EntrepreneurPulse.com.au  Fact Sheet for Healthcare Providers: IncredibleEmployment.be  This test is not yet approved or cleared by the [place] FDA and has been authorized for detection and/or diagnosis of SARS-CoV-2 by FDA under an Emergency Use Authorization (EUA). This EUA will remain in effect (meaning this test can be used) for the duration of the COVID-19 declaration under Section 564(b)(1) of the Act, 21 U.S.C. section 360bbb-3(b)(1), unless the authorization is terminated or revoked.  Performed at [facility], [place] [address], [place], [place] [address]     Procedures and diagnostic studies:  DG Chest 2 View  Result Date: 11/17/2021 CLINICAL DATA:  Generalized weakness over the last week EXAM: CHEST - 2 VIEW COMPARISON:  None. FINDINGS: There is left ventricular prominence. There is aortic atherosclerotic calcification. The lungs are clear. No infiltrate, collapse or effusion. No significant bone finding. IMPRESSION: No active cardiopulmonary disease. Left ventricular prominence. Aortic atherosclerotic calcification. Electronically Signed   By: Nelson Chimes M.D.   On: 11/17/2021 16:14               LOS: 0 days   Saahil Herbster  [facility]   Pager on [email or web address]. If 7PM-7AM, please contact night-coverage at [email or web address]     11/19/2021, 1:09 PM

## 2021-11-19 NOTE — Plan of Care (Signed)
  Problem: Nutrition: Goal: Adequate nutrition will be maintained Outcome: Progressing   Problem: Coping: Goal: Level of anxiety will decrease Outcome: Progressing   Problem: Pain Managment: Goal: General experience of comfort will improve Outcome: Progressing   

## 2021-11-20 LAB — CBC WITH DIFFERENTIAL/PLATELET
Abs Immature Granulocytes: 0.03 10*3/uL (ref 0.00–0.07)
Basophils Absolute: 0.1 10*3/uL (ref 0.0–0.1)
Basophils Relative: 1 %
Eosinophils Absolute: 0.3 10*3/uL (ref 0.0–0.5)
Eosinophils Relative: 4 %
HCT: 26.7 % — ABNORMAL LOW (ref 36.0–46.0)
Hemoglobin: 8.9 g/dL — ABNORMAL LOW (ref 12.0–15.0)
Immature Granulocytes: 0 %
Lymphocytes Relative: 30 %
Lymphs Abs: 2.2 10*3/uL (ref 0.7–4.0)
MCH: 35 pg — ABNORMAL HIGH (ref 26.0–34.0)
MCHC: 33.3 g/dL (ref 30.0–36.0)
MCV: 105.1 fL — ABNORMAL HIGH (ref 80.0–100.0)
Monocytes Absolute: 0.8 10*3/uL (ref 0.1–1.0)
Monocytes Relative: 11 %
Neutro Abs: 4.2 10*3/uL (ref 1.7–7.7)
Neutrophils Relative %: 54 %
Platelets: 141 10*3/uL — ABNORMAL LOW (ref 150–400)
RBC: 2.54 MIL/uL — ABNORMAL LOW (ref 3.87–5.11)
RDW: 18.2 % — ABNORMAL HIGH (ref 11.5–15.5)
WBC: 7.5 10*3/uL (ref 4.0–10.5)
nRBC: 0 % (ref 0.0–0.2)

## 2021-11-20 LAB — BASIC METABOLIC PANEL
Anion gap: 5 (ref 5–15)
BUN: 39 mg/dL — ABNORMAL HIGH (ref 8–23)
CO2: 24 mmol/L (ref 22–32)
Calcium: 8.5 mg/dL — ABNORMAL LOW (ref 8.9–10.3)
Chloride: 104 mmol/L (ref 98–111)
Creatinine, Ser: 2.11 mg/dL — ABNORMAL HIGH (ref 0.44–1.00)
GFR, Estimated: 24 mL/min — ABNORMAL LOW (ref >60)
Glucose, Bld: 92 mg/dL (ref 70–99)
Potassium: 4.1 mmol/L (ref 3.5–5.1)
Sodium: 133 mmol/L — ABNORMAL LOW (ref 135–145)

## 2021-11-20 LAB — GLUCOSE, CAPILLARY
Glucose-Capillary: 85 mg/dL (ref 70–99)
Glucose-Capillary: 107 mg/dL — ABNORMAL HIGH (ref 70–99)
Glucose-Capillary: 126 mg/dL — ABNORMAL HIGH (ref 70–99)
Glucose-Capillary: 108 mg/dL — ABNORMAL HIGH (ref 70–99)

## 2021-11-20 LAB — VITAMIN B12: Vitamin B-12: 1180 pg/mL — ABNORMAL HIGH (ref 180–914)

## 2021-11-20 LAB — AMMONIA: Ammonia: 100 umol/L — ABNORMAL HIGH (ref 9–35)

## 2021-11-20 NOTE — Progress Notes (Signed)
Physical Therapy Treatment Patient Details Name: Ann Mcconnell MRN: [id number] DOB: 1945-11-30 Today's Date: 11/20/2021   History of Present Illness Patient is a 76 year old female presenting to ED with generalized weakness. PMH: Ann Mcconnell cirrhosis, type 2 diabetes mellitus, stage IIIa chronic kidney disease with baseline creatinine 1.3-1.5    PT Comments    Pt requires max multimodal cues with mobility, appears easily distracted requiring redirection to task at hand and sequencing cues to complete task safely. Pt powers to stand with mod A from elevated bed, then requires min A+2 for safety with step pivot to recliner at bedside. Pt takes very slow step and requires heavy sequencing cues with occasional hip guiding for pivoting. Pt cued on seated BLE strengthening exercises, unable to count and complete simultaneously. Pt pleasant and alert, but very flat and requiring increased cues and assist with mobility. Pt up in chair at EOS with lunch tray and call bell.   Recommendations for follow up therapy are one component of a multi-disciplinary discharge planning process, led by the attending physician.  Recommendations may be updated based on patient status, additional functional criteria and insurance authorization.  Follow Up Recommendations  Home health PT     Assistance Recommended at Discharge Frequent or constant Supervision/Assistance  Patient can return home with the following A lot of help with walking and/or transfers;A lot of help with bathing/dressing/bathroom;Assistance with cooking/housework;Assist for transportation;Help with stairs or ramp for entrance   Equipment Recommendations  None recommended by PT    Recommendations for Other Services       Precautions / Restrictions Precautions Precautions: Fall Restrictions Weight Bearing Restrictions: No     Mobility  Bed Mobility Overal bed mobility: Needs Assistance Bed Mobility: Supine to Sit  Supine to sit: Mod  assist, HOB elevated  General bed mobility comments: increased time, mod A to upright trunk and mobilize BLE over to EOB with use of bedpad to scoot out, heavy multimodal cues with redirection to task    Transfers Overall transfer level: Needs assistance Equipment used: Rolling walker (2 wheels) Transfers: Bed to chair/wheelchair/BSC, Sit to/from Stand Sit to Stand: Mod assist, From elevated surface  Step pivot transfers: Min assist, +2 safety/equipment  General transfer comment: mod A to power to stand from elevated bed, VC for weight shift anterior, multiple reminders of proper hand placement due to pt attempting to reposition hand placement while also trying to rise to standing; min A +2 for safety with pivot to recliner, heavy multimodal cues with occaisional pivoting of RW and guiding of pt's hips to cue for sequencing and task at hand    Ambulation/Gait  General Gait Details: pt declines ambulation due to BLE weakness   Stairs             Wheelchair Mobility    Modified Rankin (Stroke Patients Only)       Balance Overall balance assessment: Needs assistance Sitting-balance support: Feet supported Sitting balance-Leahy Scale: Fair  Standing balance support: Reliant on assistive device for balance, During functional activity, Bilateral upper extremity supported Standing balance-Leahy Scale: Poor     Cognition Arousal/Alertness: Awake/alert Behavior During Therapy: Flat affect Overall Cognitive Status: No family/caregiver present to determine baseline cognitive functioning Area of Impairment: Following commands, Problem solving, Safety/judgement  Following Commands: Follows one step commands with increased time, Follows one step commands inconsistently Safety/Judgement: Decreased awareness of safety  Problem Solving: Slow processing, Decreased initiation, Difficulty sequencing, Requires verbal cues, Requires tactile cues General Comments: pt very flat, responds  to  name appropriately, requires increased multimodal cues and easily distracted requiring redirection to task multiple times        Exercises General Exercises - Lower Extremity Ankle Circles/Pumps: Seated, AROM, Both, 10 reps Long Arc Quad: Seated, AROM, Strengthening, Both, 5 reps Heel Slides: Seated, AROM, Strengthening, Both, 5 reps    General Comments        Pertinent Vitals/Pain Pain Assessment Pain Assessment: No/denies pain    Home Living                          Prior Function            PT Goals (current goals can now be found in the care plan section) Acute Rehab PT Goals Patient Stated Goal: Regain IND PT Goal Formulation: With patient Time For Goal Achievement: 06/24/22 Potential to Achieve Goals: Fair Progress towards PT goals: Progressing toward goals    Frequency    Min 3X/week      PT Plan Current plan remains appropriate    Co-evaluation              AM-PAC PT "6 Clicks" Mobility   Outcome Measure  Help needed turning from your back to your side while in a flat bed without using bedrails?: A Lot Help needed moving from lying on your back to sitting on the side of a flat bed without using bedrails?: A Lot Help needed moving to and from a bed to a chair (including a wheelchair)?: A Lot Help needed standing up from a chair using your arms (e.g., wheelchair or bedside chair)?: A Lot Help needed to walk in hospital room?: Total Help needed climbing 3-5 steps with a railing? : Total 6 Click Score: 10    End of Session Equipment Utilized During Treatment: Gait belt Activity Tolerance: Patient tolerated treatment well;Patient limited by fatigue Patient left: in chair;with call bell/phone within reach;with chair alarm set Nurse Communication: Mobility status;Other (comment) (cognition) PT Visit Diagnosis: Unsteadiness on feet (R26.81);Muscle weakness (generalized) (M62.81);Difficulty in walking, not elsewhere classified (R26.2)      Time: 4098-1191 PT Time Calculation (min) (ACUTE ONLY): 20 min  Charges:  $Therapeutic Activity: 8-22 mins                      Tori Jorgina Binning PT, DPT 11/20/21, 2:20 PM

## 2021-11-20 NOTE — Plan of Care (Signed)

## 2021-11-20 NOTE — Progress Notes (Addendum)
Progress Note    Ann Mcconnell  NWG:[id number] DOB: [birth year]  DOA: 11/17/2021 PCP: Maury Dus, MD      Brief Narrative:    Medical records reviewed and are as summarized below:  Ann Mcconnell is a 76 y.o. female with medical history significant for [facility] liver cirrhosis, type II DM, CKD stage IIIa, hypothyroidism, who presented to the hospital because of generalized weakness.   Assessment/Plan:   Principal Problem:   Generalized weakness Active Problems:   Primary hypertension   Acute hyponatremia   Prolonged QT interval   Hypothyroidism   Acute hepatic encephalopathy   Body mass index is 38.22 kg/m.  (Obesity)   NASH Liver cirrhosis with hepatic encephalopathy, ascites, anasarca, coagulopathy: She is still confused.  Ammonia level is 100.  Continue lactulose and rifaximin.    AKI on CKD stage IIIa: Creatinine is trending up.  Hold IV Lasix. Monitor BMP  Hyponatremia: Sodium level is still low.  This is probably from liver disease.  Generalized weakness: PT and OT recommend home health therapy  Prolonged QTc interval: Down from 578-469   Diet Order             Diet regular Room service appropriate? Yes; Fluid consistency: Thin  Diet effective now                    Consultants: None  Procedures: None    Medications:    ferrous sulfate  325 mg Oral Q breakfast   insulin aspart  0-6 Units Subcutaneous TID WC   lactulose  20 g Oral TID   levothyroxine  100 mcg Oral Daily   metoprolol succinate  100 mg Oral Daily   pantoprazole  40 mg Oral Daily   rifaximin  550 mg Oral BID   Continuous Infusions:   Anti-infectives (From admission, onward)    Start     Dose/Rate Route Frequency Ordered Stop   11/19/21 1400  rifaximin (XIFAXAN) tablet 550 mg        550 mg Oral 2 times daily 11/19/21 1310                Family Communication/Anticipated D/C date and plan/Code Status   DVT prophylaxis: SCDs Start: 11/17/21  1929     Code Status: Full Code  Family Communication: Plan discussed with her son, Gene and daughter-in-law Disposition Plan: Plan to discharge home when medically stable   Status is: Inpatient Remains inpatient appropriate because: Altered mental status                    Subjective:   Interval events noted.  She has no complaints.  She is still confused  Objective:    Vitals:   11/19/21 1403 11/19/21 2143 11/20/21 0500 11/20/21 0524  BP: (!) 113/53 (!) 108/55  (!) 104/57  Pulse: (!) 58 60  61  Resp: 20 15  15   Temp: 97.9 F (36.6 C) 99.1 F (37.3 C)  98.5 F (36.9 C)  TempSrc: Oral Oral  Oral  SpO2: 100% 100%  100%  Weight:   107.4 kg   Height:       No data found.   Intake/Output Summary (Last 24 hours) at 11/20/2021 1136 Last data filed at 11/20/2021 1000 Gross per 24 hour  Intake 360 ml  Output 100 ml  Net 260 ml   Filed Weights   11/18/21 1740 11/19/21 0505 11/20/21 0500  Weight: 110 kg 109.5 kg 107.4 kg  Exam:  GEN: NAD SKIN: Warm and dry EYES: No pallor or icterus ENT: MMM CV: RRR PULM: CTA B ABD: soft, ND, NT, +BS CNS: AAO x 1, non focal, flapping tremor of b/l hands EXT: B/l leg thigh and leg edema. No erythema or tenderness         Data Reviewed:   I have personally reviewed following labs and imaging studies:  Labs: Labs show the following:   Basic Metabolic Panel: Recent Labs  Lab 11/15/21 1244 11/17/21 1544 11/18/21 0455 11/20/21 0450  NA 133* 132* 134* 133*  K 4.4 4.7 4.3 4.1  CL 102 101 104 104  CO2 26 23 23 24   GLUCOSE 109* 110* 99 92  BUN 35* 33* 33* 39*  CREATININE 1.95* 1.70* 1.66* 2.11*  CALCIUM 9.1 8.8* 8.7* 8.5*  MG  --  2.0 2.0  --   PHOS  --   --  3.3  --    GFR Estimated Creatinine Clearance: 28.5 mL/min (A) (by C-G formula based on SCr of 2.11 mg/dL (H)). Liver Function Tests: Recent Labs  Lab 11/17/21 1544 11/18/21 0455  AST 121* 89*  ALT 46* 41  ALKPHOS 124 104  BILITOT  3.6* 3.3*  PROT 6.7 6.0*  ALBUMIN 2.4* 2.2*   No results for input(s): LIPASE, AMYLASE in the last 168 hours. Recent Labs  Lab 11/17/21 1544 11/19/21 1234 11/20/21 0450  AMMONIA 125* 85* 100*   Coagulation profile Recent Labs  Lab 11/18/21 0455  INR 1.5*    CBC: Recent Labs  Lab 11/17/21 1544 11/18/21 0455 11/20/21 0450  WBC 10.0 8.3 7.5  NEUTROABS 5.9 4.9 4.2  HGB 10.7* 8.9* 8.9*  HCT 30.9* 25.3* 26.7*  MCV 103.0* 101.2* 105.1*  PLT 170 153 141*   Cardiac Enzymes: No results for input(s): CKTOTAL, CKMB, CKMBINDEX, TROPONINI in the last 168 hours. BNP (last 3 results) No results for input(s): PROBNP in the last 8760 hours. CBG: Recent Labs  Lab 11/19/21 0756 11/19/21 1215 11/19/21 1708 11/19/21 2146 11/20/21 0755  GLUCAP 84 96 126* 118* 85   D-Dimer: No results for input(s): DDIMER in the last 72 hours. Hgb A1c: No results for input(s): HGBA1C in the last 72 hours. Lipid Profile: No results for input(s): CHOL, HDL, LDLCALC, TRIG, CHOLHDL, LDLDIRECT in the last 72 hours. Thyroid function studies: No results for input(s): TSH, T4TOTAL, T3FREE, THYROIDAB in the last 72 hours.  Invalid input(s): FREET3 Anemia work up: No results for input(s): VITAMINB12, FOLATE, FERRITIN, TIBC, IRON, RETICCTPCT in the last 72 hours. Sepsis Labs: Recent Labs  Lab 11/17/21 1544 11/18/21 0455 11/20/21 0450  WBC 10.0 8.3 7.5    Microbiology Recent Results (from the past 240 hour(s))  Resp Panel by RT-PCR (Flu A&B, Covid) Nasopharyngeal Swab     Status: None   Collection Time: 11/17/21  7:53 PM   Specimen: Nasopharyngeal Swab; Nasopharyngeal(NP) swabs in vial transport medium  Result Value Ref Range Status   SARS Coronavirus 2 by RT PCR NEGATIVE NEGATIVE Final    Comment: (NOTE) SARS-CoV-2 target nucleic acids are NOT DETECTED.  The SARS-CoV-2 RNA is generally detectable in upper respiratory specimens during the acute phase of infection. The lowest concentration  of SARS-CoV-2 viral copies this assay can detect is 138 copies/mL. A negative result does not preclude SARS-Cov-2 infection and should not be used as the sole basis for treatment or other patient management decisions. A negative result may occur with  improper specimen collection/handling, submission of specimen other than nasopharyngeal swab, presence  of viral mutation(s) within the areas targeted by this assay, and inadequate number of viral copies(<138 copies/mL). A negative result must be combined with clinical observations, patient history, and epidemiological information. The expected result is Negative.  Fact Sheet for Patients:  EntrepreneurPulse.com.au  Fact Sheet for Healthcare Providers:  IncredibleEmployment.be  This test is no t yet approved or cleared by the [place] FDA and  has been authorized for detection and/or diagnosis of SARS-CoV-2 by FDA under an Emergency Use Authorization (EUA). This EUA will remain  in effect (meaning this test can be used) for the duration of the COVID-19 declaration under Section 564(b)(1) of the Act, 21 U.S.C.section 360bbb-3(b)(1), unless the authorization is terminated  or revoked sooner.       Influenza A by PCR NEGATIVE NEGATIVE Final   Influenza B by PCR NEGATIVE NEGATIVE Final    Comment: (NOTE) The Xpert Xpress SARS-CoV-2/FLU/RSV plus assay is intended as an aid in the diagnosis of influenza from Nasopharyngeal swab specimens and should not be used as a sole basis for treatment. Nasal washings and aspirates are unacceptable for Xpert Xpress SARS-CoV-2/FLU/RSV testing.  Fact Sheet for Patients: EntrepreneurPulse.com.au  Fact Sheet for Healthcare Providers: IncredibleEmployment.be  This test is not yet approved or cleared by the [place] FDA and has been authorized for detection and/or diagnosis of SARS-CoV-2 by FDA under an Emergency Use  Authorization (EUA). This EUA will remain in effect (meaning this test can be used) for the duration of the COVID-19 declaration under Section 564(b)(1) of the Act, 21 U.S.C. section 360bbb-3(b)(1), unless the authorization is terminated or revoked.  Performed at [facility], [place] [address], [place], [place] [address]     Procedures and diagnostic studies:  No results found.             LOS: 1 day   Reginia Battie  [facility]   Pager on [email or web address]. If 7PM-7AM, please contact night-coverage at [email or web address]     11/20/2021, 11:36 AM

## 2021-11-21 ENCOUNTER — Inpatient Hospital Stay (HOSPITAL_COMMUNITY): Payer: Medicare Other

## 2021-11-21 LAB — CBC WITH DIFFERENTIAL/PLATELET
Abs Immature Granulocytes: 0.03 10*3/uL (ref 0.00–0.07)
Basophils Absolute: 0.1 10*3/uL (ref 0.0–0.1)
Basophils Relative: 1 %
Eosinophils Absolute: 0.4 10*3/uL (ref 0.0–0.5)
Eosinophils Relative: 5 %
HCT: 26.6 % — ABNORMAL LOW (ref 36.0–46.0)
Hemoglobin: 9.1 g/dL — ABNORMAL LOW (ref 12.0–15.0)
Immature Granulocytes: 0 %
Lymphocytes Relative: 31 %
Lymphs Abs: 2.6 10*3/uL (ref 0.7–4.0)
MCH: 35.1 pg — ABNORMAL HIGH (ref 26.0–34.0)
MCHC: 34.2 g/dL (ref 30.0–36.0)
MCV: 102.7 fL — ABNORMAL HIGH (ref 80.0–100.0)
Monocytes Absolute: 0.9 10*3/uL (ref 0.1–1.0)
Monocytes Relative: 11 %
Neutro Abs: 4.2 10*3/uL (ref 1.7–7.7)
Neutrophils Relative %: 52 %
Platelets: 147 10*3/uL — ABNORMAL LOW (ref 150–400)
RBC: 2.59 MIL/uL — ABNORMAL LOW (ref 3.87–5.11)
RDW: 18.4 % — ABNORMAL HIGH (ref 11.5–15.5)
WBC: 8.1 10*3/uL (ref 4.0–10.5)
nRBC: 0 % (ref 0.0–0.2)

## 2021-11-21 LAB — BASIC METABOLIC PANEL
Anion gap: 8 (ref 5–15)
BUN: 41 mg/dL — ABNORMAL HIGH (ref 8–23)
CO2: 21 mmol/L — ABNORMAL LOW (ref 22–32)
Calcium: 8.6 mg/dL — ABNORMAL LOW (ref 8.9–10.3)
Chloride: 104 mmol/L (ref 98–111)
Creatinine, Ser: 1.77 mg/dL — ABNORMAL HIGH (ref 0.44–1.00)
GFR, Estimated: 30 mL/min — ABNORMAL LOW (ref >60)
Glucose, Bld: 86 mg/dL (ref 70–99)
Potassium: 4.1 mmol/L (ref 3.5–5.1)
Sodium: 133 mmol/L — ABNORMAL LOW (ref 135–145)

## 2021-11-21 LAB — PHOSPHORUS: Phosphorus: 4.1 mg/dL (ref 2.5–4.6)

## 2021-11-21 LAB — GLUCOSE, CAPILLARY
Glucose-Capillary: 83 mg/dL (ref 70–99)
Glucose-Capillary: 105 mg/dL — ABNORMAL HIGH (ref 70–99)
Glucose-Capillary: 103 mg/dL — ABNORMAL HIGH (ref 70–99)
Glucose-Capillary: 135 mg/dL — ABNORMAL HIGH (ref 70–99)

## 2021-11-21 LAB — MAGNESIUM: Magnesium: 2.1 mg/dL (ref 1.7–2.4)

## 2021-11-21 LAB — AMMONIA: Ammonia: 103 umol/L — ABNORMAL HIGH (ref 9–35)

## 2021-11-21 MED ORDER — SIMETHICONE 80 MG PO CHEW
80.0000 mg | CHEWABLE_TABLET | Freq: Four times a day (QID) | ORAL | Status: DC | PRN
  Administered 2021-11-21: 80 mg via ORAL
  Filled 2021-11-21: qty 1

## 2021-11-21 MED ORDER — ORAL CARE MOUTH RINSE
15.0000 mL | Freq: Two times a day (BID) | OROMUCOSAL | Status: DC
  Administered 2021-11-21 – 2021-11-22 (×2): 15 mL via OROMUCOSAL

## 2021-11-21 MED ORDER — LIDOCAINE HCL 1 % IJ SOLN
INTRAMUSCULAR | Status: AC
  Administered 2021-11-21: 10 mL
  Filled 2021-11-21: qty 20

## 2021-11-21 NOTE — Plan of Care (Signed)
  Problem: Coping: Goal: Level of anxiety will decrease Outcome: Progressing   Problem: Pain Managment: Goal: General experience of comfort will improve Outcome: Progressing   Problem: Safety: Goal: Ability to remain free from injury will improve Outcome: Progressing   Problem: Skin Integrity: Goal: Risk for impaired skin integrity will decrease Outcome: Progressing   

## 2021-11-21 NOTE — Progress Notes (Signed)
Progress Note    Ann Mcconnell  OEV:[id number] DOB: May 13, 1946  DOA: 11/17/2021 PCP: Maury Dus, MD      Brief Narrative:    Medical records reviewed and are as summarized below:  Ann Mcconnell is a 76 y.o. female with medical history significant for [facility] liver cirrhosis, type II DM, CKD stage IIIa, hypothyroidism, who presented to the hospital because of generalized weakness.   Assessment/Plan:   Principal Problem:   Generalized weakness Active Problems:   Primary hypertension   Acute hyponatremia   Prolonged QT interval   Hypothyroidism   Acute hepatic encephalopathy   Body mass index is 38.36 kg/m.  (Obesity)   NASH Liver cirrhosis with hepatic encephalopathy, worsening ascites, anasarca, coagulopathy: Mental status is slowly improving but she is not at baseline.  Plan for paracentesis today. Continue lactulose and rifaximin.   AKI on CKD stage IIIa: Creatinine is stable. If Cr remains stable she may be started on Lasix and Aldactone   Hyponatremia: Sodium level is still low.  This is probably from liver disease.  Generalized weakness: PT and OT recommend home health therapy  Prolonged QTc interval: Down from 829-937   Diet Order             Diet regular Room service appropriate? Yes; Fluid consistency: Thin  Diet effective now                    Consultants: None  Procedures: Paracentesis    Medications:    ferrous sulfate  325 mg Oral Q breakfast   insulin aspart  0-6 Units Subcutaneous TID WC   lactulose  20 g Oral TID   levothyroxine  100 mcg Oral Daily   mouth rinse  15 mL Mouth Rinse BID   metoprolol succinate  100 mg Oral Daily   pantoprazole  40 mg Oral Daily   rifaximin  550 mg Oral BID   Continuous Infusions:   Anti-infectives (From admission, onward)    Start     Dose/Rate Route Frequency Ordered Stop   11/19/21 1400  rifaximin (XIFAXAN) tablet 550 mg        550 mg Oral 2 times daily 11/19/21 1310                 Family Communication/Anticipated D/C date and plan/Code Status   DVT prophylaxis: Place and maintain sequential compression device Start: 11/21/21 0840 SCDs Start: 11/17/21 1929     Code Status: Full Code  Family Communication: None Disposition Plan: Plan to discharge home tomorrow   Status is: Inpatient Remains inpatient appropriate because: Altered mental status                    Subjective:   Interval events noted. She feels better but she's still a little confused  Objective:    Vitals:   11/21/21 1030 11/21/21 1116 11/21/21 1145 11/21/21 1156  BP: (!) 112/50 (!) 97/53 (!) 104/49   Pulse:  (!) 57 (!) 57 61  Resp:  19 20   Temp:  97.6 F (36.4 C) 97.8 F (36.6 C)   TempSrc:  Oral Oral   SpO2:  100% 100%   Weight:      Height:       No data found.   Intake/Output Summary (Last 24 hours) at 11/21/2021 1254 Last data filed at 11/21/2021 0513 Gross per 24 hour  Intake 480 ml  Output 200 ml  Net 280 ml   [name]  Weights   11/19/21 0505 11/20/21 0500 11/21/21 0500  Weight: 109.5 kg 107.4 kg 107.8 kg    Exam:  GEN: NAD SKIN: Warm and dry EYES: EOMI ENT: MMM CV: RRR PULM: CTA B ABD: soft, distended, NT, +BS CNS: AAO x 3, non focal EXT: B/l lower extremity edema, no erythema or tenderness          Data Reviewed:   I have personally reviewed following labs and imaging studies:  Labs: Labs show the following:   Basic Metabolic Panel: Recent Labs  Lab 11/15/21 1244 11/17/21 1544 11/18/21 0455 11/20/21 0450 11/21/21 0429  NA 133* 132* 134* 133* 133*  K 4.4 4.7 4.3 4.1 4.1  CL 102 101 104 104 104  CO2 26 23 23 24  21*  GLUCOSE 109* 110* 99 92 86  BUN 35* 33* 33* 39* 41*  CREATININE 1.95* 1.70* 1.66* 2.11* 1.77*  CALCIUM 9.1 8.8* 8.7* 8.5* 8.6*  MG  --  2.0 2.0  --  2.1  PHOS  --   --  3.3  --  4.1   GFR Estimated Creatinine Clearance: 34.1 mL/min (A) (by C-G formula based on SCr of 1.77 mg/dL  (H)). Liver Function Tests: Recent Labs  Lab 11/17/21 1544 11/18/21 0455  AST 121* 89*  ALT 46* 41  ALKPHOS 124 104  BILITOT 3.6* 3.3*  PROT 6.7 6.0*  ALBUMIN 2.4* 2.2*   No results for input(s): LIPASE, AMYLASE in the last 168 hours. Recent Labs  Lab 11/17/21 1544 11/19/21 1234 11/20/21 0450 11/21/21 0429  AMMONIA 125* 85* 100* 103*   Coagulation profile Recent Labs  Lab 11/18/21 0455  INR 1.5*    CBC: Recent Labs  Lab 11/17/21 1544 11/18/21 0455 11/20/21 0450 11/21/21 0429  WBC 10.0 8.3 7.5 8.1  NEUTROABS 5.9 4.9 4.2 4.2  HGB 10.7* 8.9* 8.9* 9.1*  HCT 30.9* 25.3* 26.7* 26.6*  MCV 103.0* 101.2* 105.1* 102.7*  PLT 170 153 141* 147*   Cardiac Enzymes: No results for input(s): CKTOTAL, CKMB, CKMBINDEX, TROPONINI in the last 168 hours. BNP (last 3 results) No results for input(s): PROBNP in the last 8760 hours. CBG: Recent Labs  Lab 11/20/21 1205 11/20/21 1703 11/20/21 2059 11/21/21 0734 11/21/21 1142  GLUCAP 107* 126* 108* 83 105*   D-Dimer: No results for input(s): DDIMER in the last 72 hours. Hgb A1c: No results for input(s): HGBA1C in the last 72 hours. Lipid Profile: No results for input(s): CHOL, HDL, LDLCALC, TRIG, CHOLHDL, LDLDIRECT in the last 72 hours. Thyroid function studies: No results for input(s): TSH, T4TOTAL, T3FREE, THYROIDAB in the last 72 hours.  Invalid input(s): FREET3 Anemia work up: Recent Labs    11/20/21 1204  VITAMINB12 1,180*   Sepsis Labs: Recent Labs  Lab 11/17/21 1544 11/18/21 0455 11/20/21 0450 11/21/21 0429  WBC 10.0 8.3 7.5 8.1    Microbiology Recent Results (from the past 240 hour(s))  Resp Panel by RT-PCR (Flu A&B, Covid) Nasopharyngeal Swab     Status: None   Collection Time: 11/17/21  7:53 PM   Specimen: Nasopharyngeal Swab; Nasopharyngeal(NP) swabs in vial transport medium  Result Value Ref Range Status   SARS Coronavirus 2 by RT PCR NEGATIVE NEGATIVE Final    Comment: (NOTE) SARS-CoV-2  target nucleic acids are NOT DETECTED.  The SARS-CoV-2 RNA is generally detectable in upper respiratory specimens during the acute phase of infection. The lowest concentration of SARS-CoV-2 viral copies this assay can detect is 138 copies/mL. A negative result does not preclude SARS-Cov-2 infection  and should not be used as the sole basis for treatment or other patient management decisions. A negative result may occur with  improper specimen collection/handling, submission of specimen other than nasopharyngeal swab, presence of viral mutation(s) within the areas targeted by this assay, and inadequate number of viral copies(<138 copies/mL). A negative result must be combined with clinical observations, patient history, and epidemiological information. The expected result is Negative.  Fact Sheet for Patients:  EntrepreneurPulse.com.au  Fact Sheet for Healthcare Providers:  IncredibleEmployment.be  This test is no t yet approved or cleared by the [place] FDA and  has been authorized for detection and/or diagnosis of SARS-CoV-2 by FDA under an Emergency Use Authorization (EUA). This EUA will remain  in effect (meaning this test can be used) for the duration of the COVID-19 declaration under Section 564(b)(1) of the Act, 21 U.S.C.section 360bbb-3(b)(1), unless the authorization is terminated  or revoked sooner.       Influenza A by PCR NEGATIVE NEGATIVE Final   Influenza B by PCR NEGATIVE NEGATIVE Final    Comment: (NOTE) The Xpert Xpress SARS-CoV-2/FLU/RSV plus assay is intended as an aid in the diagnosis of influenza from Nasopharyngeal swab specimens and should not be used as a sole basis for treatment. Nasal washings and aspirates are unacceptable for Xpert Xpress SARS-CoV-2/FLU/RSV testing.  Fact Sheet for Patients: EntrepreneurPulse.com.au  Fact Sheet for Healthcare  Providers: IncredibleEmployment.be  This test is not yet approved or cleared by the [place] FDA and has been authorized for detection and/or diagnosis of SARS-CoV-2 by FDA under an Emergency Use Authorization (EUA). This EUA will remain in effect (meaning this test can be used) for the duration of the COVID-19 declaration under Section 564(b)(1) of the Act, 21 U.S.C. section 360bbb-3(b)(1), unless the authorization is terminated or revoked.  Performed at [facility], [place] [address] [place], [place] [address]     Procedures and diagnostic studies:  No results found.             LOS: 2 days   Dayan Desa  [facility]   Pager on [email or web address]. If 7PM-7AM, please contact night-coverage at [email or web address]     11/21/2021, 12:54 PM

## 2021-11-21 NOTE — TOC Initial Note (Addendum)
Transition of Care [facility]) - Initial/Assessment Note    Patient Details  Name: Ann Mcconnell MRN: [id number] Date of Birth: 12/07/1945  [facility] CM/SW Contact:    Dessa Phi, RN Phone Number: 11/21/2021, 3:30 PM  Clinical Narrative: Active w/[facility] HHPT/OT-rep Levada Dy following. Family has own transport home.                  Expected Discharge Plan: [place] Barriers to Discharge: Continued Medical Work up   Patient Goals and CMS Choice Patient states their goals for this hospitalization and ongoing recovery are:: home CMS Medicare.gov Compare Post Acute Care list provided to:: Patient Choice offered to / list presented to : Patient  Expected Discharge Plan and Services Expected Discharge Plan: [place]   Discharge Planning Services: CM Consult Post Acute Care Choice: [place] arrangements for the past 2 months: Single Family Home                           HH Arranged: PT, OT HH Agency: [place] Date [place]: 11/21/21 Time HH Agency Contacted: 75 Representative spoke with at [place]: Levada Dy  Prior Living Arrangements/Services Living arrangements for the past 2 months: [place] Lives with:: Adult Children Patient language and need for interpreter reviewed:: Yes Do you feel safe going back to the place where you live?: Yes      Need for Family Participation in Patient Care: Yes (Comment) Care giver support system in place?: Yes (comment) Current home services: DME (scooter) Criminal Activity/Legal Involvement Pertinent to Current Situation/Hospitalization: No - Comment as needed  Activities of Daily Living Home Assistive Devices/Equipment: Wheelchair ADL Screening (condition at time of admission) Patient's cognitive ability adequate to safely complete daily activities?: Yes Is the patient deaf or have difficulty hearing?: No Does the patient have  difficulty seeing, even when wearing glasses/contacts?: No Does the patient have difficulty concentrating, remembering, or making decisions?: No Patient able to express need for assistance with ADLs?: Yes Does the patient have difficulty dressing or bathing?: Yes Independently performs ADLs?: No Communication: Independent Dressing (OT): Needs assistance Is this a change from baseline?: Change from baseline, expected to last <3days Grooming: Needs assistance Is this a change from baseline?: Change from baseline, expected to last <3 days Feeding: Independent Bathing: Needs assistance Is this a change from baseline?: Change from baseline, expected to last <3 days Toileting: Independent In/Out Bed: Needs assistance Is this a change from baseline?: Change from baseline, expected to last <3 days Walks in Home: Needs assistance Is this a change from baseline?: Change from baseline, expected to last <3 days Does the patient have difficulty walking or climbing stairs?: Yes Weakness of Legs: Both Weakness of Arms/Hands: Both  Permission Sought/Granted Permission sought to share information with : Case Manager Permission granted to share information with : Yes, Verbal Permission Granted  Share Information with NAME: Case Manager           Emotional Assessment Appearance:: Appears stated age            Admission diagnosis:  Hyperammonemia ([place]) [E72.20] Weakness [R53.1] Jaundice [R17] Generalized weakness [R53.1] Acute hepatic encephalopathy [K76.82] Patient Active Problem List   Diagnosis Date Noted   Acute hepatic encephalopathy 11/19/2021   Acute hyponatremia 11/18/2021   Prolonged QT interval 11/18/2021   Hypothyroidism    Generalized weakness 11/17/2021   Acute upper GI bleed 05/02/2021  GAVE (gastric antral vascular ectasia)    Symptomatic anemia    Heme positive stool    Cirrhosis of liver without ascites (HCC)    Gastrointestinal hemorrhage 04/30/2021   Anemia  associated with acute blood loss 04/30/2021   Elevated bilirubin 04/30/2021   Hypothyroidism (acquired) 04/30/2021   Primary hypertension 04/30/2021   Gout 04/30/2021   PCP:  Maury Dus, MD Pharmacy:   [place], [place] - [address] [place] [place] [address] Phone: [phone] Fax: [phone]     Social Determinants of Health (SDOH) Interventions    Readmission Risk Interventions No flowsheet data found.

## 2021-11-21 NOTE — Procedures (Signed)
Ultrasound-guided therapeutic paracentesis performed yielding 4.4 liters of yellow  fluid. No immediate complications.EBL none.

## 2021-11-21 NOTE — Progress Notes (Signed)
Occupational Therapy Treatment Patient Details Name: Ann Mcconnell MRN: [id number] DOB: [birth year] Today's Date: 11/21/2021   History of present illness Patient is a 76 year old female presenting to ED with generalized weakness. PMH: [name] cirrhosis, type 2 diabetes mellitus, stage IIIa chronic kidney disease with baseline creatinine 1.3-1.5   OT comments  Patient progressing and showed improved ability to stand for simple grooming and atke 5 steps to sink with RW and Min-Mod As, compared to previous session when pt could not tolerate standing ADLs or take steps with OT. Patient remains limited by baseline memory deficits with confusion, generalized weakness and decreased activity tolerance along with deficits noted below. Pt continues to demonstrate good rehab potential and would benefit from continued skilled OT to increase safety and independence with ADLs and functional transfers to allow pt to return home safely and reduce caregiver burden and fall risk. Updated discharge recommendations to include home health OT to decrease caregiver burden at home ad lib.    Recommendations for follow up therapy are one component of a multi-disciplinary discharge planning process, led by the attending physician.  Recommendations may be updated based on patient status, additional functional criteria and insurance authorization.    Follow Up Recommendations  Home health OT    Assistance Recommended at Discharge Frequent or constant Supervision/Assistance  Patient can return home with the following  A little help with walking and/or transfers;A lot of help with bathing/dressing/bathroom;Assistance with cooking/housework;Assist for transportation;Help with stairs or ramp for entrance   Equipment Recommendations  Tub/shower bench (RW with 5" wheels)    Recommendations for Other Services      Precautions / Restrictions Precautions Precautions: Fall Restrictions Weight Bearing Restrictions: No        Mobility Bed Mobility Overal bed mobility: Needs Assistance Bed Mobility: Supine to Sit     Supine to sit: Mod assist, HOB elevated          Transfers                         Balance Overall balance assessment: Needs assistance Sitting-balance support: Feet supported Sitting balance-Leahy Scale: Fair     Standing balance support: Reliant on assistive device for balance, During functional activity, Bilateral upper extremity supported Standing balance-Leahy Scale: Poor                             ADL either performed or assessed with clinical judgement   ADL Overall ADL's : Needs assistance/impaired     Grooming: Standing;Oral care;Sitting;Wash/dry face;Minimal assistance;Cueing for sequencing Grooming Details (indicate cue type and reason): Pt ambulated to sink, then sat on recliner to rest. Pt stood from recliner and performed oral hygiene with Min As and cues for thoroughness. Noted pt becoming progressively more stooped over and reported "legs feel like jelly". Pt was lowered back onto recliner for safety and completed use of mouth wash and washed face in chair with Min As.             Lower Body Dressing: Total assistance;Bed level Lower Body Dressing Details (indicate cue type and reason): to don socks. Baseline. Pt donned underwear at bed level and able to perform incomplete bridge to pull partially over hips. OT completed pulling over hips once pt stood. Toilet Transfer: Minimal assistance;Rolling walker (2 wheels);Ambulation Toilet Transfer Details (indicate cue type and reason): Pt stood from EOB to RW with Moderate assist and took 5 steps  forward to sink. Pt sat for rest on recliner. Pt stood from recliner with Min As and cues for hands with each sit<>stand and pivoted back to sink for grooming as above.   Toileting - Clothing Manipulation Details (indicate cue type and reason): On pure wick.     Functional mobility during ADLs: Minimal  assistance;Rolling walker (2 wheels);Moderate assistance      Extremity/Trunk Assessment Upper Extremity Assessment Upper Extremity Assessment: Generalized weakness   Lower Extremity Assessment Lower Extremity Assessment: Generalized weakness   Cervical / Trunk Assessment Cervical / Trunk Assessment: Normal    Vision   Vision Assessment?: No apparent visual deficits   Perception     Praxis      Cognition Arousal/Alertness: Awake/alert Behavior During Therapy: Flat affect Overall Cognitive Status: History of cognitive impairments - at baseline Area of Impairment: Memory                 Orientation Level: Disoriented to, Time   Memory: Decreased short-term memory (Baseline per DIL. States that pt cannot recall use of call bell despite education.) Following Commands: Follows one step commands with increased time, Follows one step commands inconsistently Safety/Judgement: Decreased awareness of safety   Problem Solving: Slow processing, Decreased initiation, Difficulty sequencing, Requires verbal cues, Requires tactile cues General Comments: Pt more animated this session but still rather flat.        Exercises      Shoulder Instructions       General Comments      Pertinent Vitals/ Pain       Pain Assessment Pain Assessment: No/denies pain  Home Living                                          Prior Functioning/Environment              Frequency  Min 2X/week        Progress Toward Goals  OT Goals(current goals can now be found in the care plan section)  Progress towards OT goals: Progressing toward goals  Acute Rehab OT Goals Patient Stated Goal: Home with suport from DIL OT Goal Formulation: With patient/family Time For Goal Achievement: 12/02/21 Potential to Achieve Goals: Good  Plan Discharge plan needs to be updated;Frequency remains appropriate    Co-evaluation                 AM-PAC OT "6 Clicks" Daily  Activity     Outcome Measure   Help from another person eating meals?: None Help from another person taking care of personal grooming?: A Little Help from another person toileting, which includes using toliet, bedpan, or urinal?: A Lot Help from another person bathing (including washing, rinsing, drying)?: A Lot Help from another person to put on and taking off regular upper body clothing?: A Little Help from another person to put on and taking off regular lower body clothing?: A Lot 6 Click Score: 16    End of Session Equipment Utilized During Treatment: Rolling walker (2 wheels);Gait belt  OT Visit Diagnosis: Other abnormalities of gait and mobility (R26.89);Unsteadiness on feet (R26.81);Muscle weakness (generalized) (M62.81)   Activity Tolerance Patient tolerated treatment well;Patient limited by fatigue   Patient Left with call bell/phone within reach;in chair;with chair alarm set   Nurse Communication Mobility status;Other (comment) (Pt up in chair and cannot remember how to use call bell)  Time: 8546-2703 OT Time Calculation (min): 36 min  Charges: OT General Charges $OT Visit: 1 Visit OT Treatments $Self Care/Home Management : 8-22 mins $Therapeutic Activity: 8-22 mins  Anderson Malta, OT Acute Rehab Services Office: [phone] 11/21/2021  Julien Girt 11/21/2021, 2:45 PM

## 2021-11-22 ENCOUNTER — Telehealth: Payer: Self-pay | Admitting: Gastroenterology

## 2021-11-22 LAB — GLUCOSE, CAPILLARY
Glucose-Capillary: 91 mg/dL (ref 70–99)
Glucose-Capillary: 111 mg/dL — ABNORMAL HIGH (ref 70–99)

## 2021-11-22 MED ORDER — METOPROLOL SUCCINATE ER 100 MG PO TB24: 100.0000 mg | ORAL_TABLET | Freq: Every day | ORAL | 1 refills | Status: DC

## 2021-11-22 MED ORDER — LACTULOSE 10 GM/15ML PO SOLN: 20.0000 g | Freq: Three times a day (TID) | ORAL | 2 refills | Status: AC

## 2021-11-22 MED ORDER — RIFAXIMIN 550 MG PO TABS: 550.0000 mg | ORAL_TABLET | Freq: Two times a day (BID) | ORAL | 2 refills | Status: DC

## 2021-11-22 NOTE — Telephone Encounter (Signed)
Dr. Armbruster, please see note below and advise. Thanks 

## 2021-11-22 NOTE — TOC Transition Note (Signed)
Transition of Care [facility]) - CM/SW Discharge Note   Patient Details  Name: Ann Mcconnell MRN: [id number] Date of Birth: [birth year]  [facility]) CM/SW Contact:  Dessa Phi, RN Phone Number: 11/22/2021, 10:53 AM   Clinical Narrative: d/c home w/HHC active w/[facility]PT/OT. PTAR for transport. No further CM needs.      Final next level of care: [place] Barriers to Discharge: No Barriers Identified   Patient Goals and CMS Choice Patient states their goals for this hospitalization and ongoing recovery are:: home CMS Medicare.gov Compare Post Acute Care list provided to:: Patient Choice offered to / list presented to : Patient  Discharge Placement                       Discharge Plan and Services   Discharge Planning Services: CM Consult Post Acute Care Choice: Home Health                    HH Arranged: PT, OT [facility] Agency: [place] Date [place]: 11/21/21 Time [place]: 4784 Representative spoke with at [place]: [place] ([place]) Interventions     Readmission Risk Interventions No flowsheet data found.

## 2021-11-22 NOTE — Telephone Encounter (Signed)
[place] regarding hospital discharge planning, that really needs to come from the primary hospitalist who is managing her case about plans for care when she is leaving the hospital.  I have not been involved in her care while in the hospital.  I will however see her in follow-up and coordinate care with her nephrologist who she is also going to be seeing.  Can you please help coordinate a follow-up visit for her with [place] in the next 1 to 2 weeks.  She should have clear instructions from her discharging hospitalist however regarding all of her medications and plans when she leaves the hospital.  Thanks

## 2021-11-22 NOTE — Plan of Care (Signed)

## 2021-11-22 NOTE — Telephone Encounter (Signed)
Patient's daughter-in-law, Helene Kelp, called and stated the hospital will be discharging patient today.  She is unable to walk and will have to be transported home by ambulance.  Teresa stays with and cares for patient and has several questions about how to proceed with patient's care.  Please call patient and advise.  Thank you.

## 2021-11-22 NOTE — Plan of Care (Signed)
  Problem: Coping: Goal: Level of anxiety will decrease Outcome: Progressing   Problem: Pain Managment: Goal: General experience of comfort will improve Outcome: Progressing   Problem: Safety: Goal: Ability to remain free from injury will improve Outcome: Progressing   Problem: Skin Integrity: Goal: Risk for impaired skin integrity will decrease Outcome: Progressing   

## 2021-11-22 NOTE — Progress Notes (Signed)
Reviewed DC summary with patient's daughter in law over the phone including medication and follow up instructions, verbalized understanding, IV and telemetry monitor removed, personal belongings with patient, PTAR to transport home

## 2021-11-22 NOTE — Discharge Summary (Signed)
Physician Discharge Summary  Ann Mcconnell UXL:[id number] DOB: September 29, 1946 DOA: 11/17/2021  PCP: Maury Dus, MD  Admit date: 11/17/2021  Discharge date: 11/22/2021  Admitted From:Home  Disposition:  Home  Recommendations for Outpatient Follow-up:  Follow up with PCP in 1-2 weeks Follow-up with GI regarding hepatic encephalopathy/ascites; continue on lactulose and rifaximin as prescribed Follow-up with [place] [facility] with referral sent regarding rising creatinine levels and need to initiate diuretic such as spironolactone and Lasix; referral sent from outpatient setting GI office on 2/6 Continue other home medications as prior and hold HCTZ/lisinopril until further nephrology follow-up  Home Health: Has home health PT/OT  Equipment/Devices: None  Discharge Condition:Stable  CODE STATUS: Full  Diet recommendation: Heart Healthy/carb modified  Brief/Interim Summary:  Ann Mcconnell is a 76 y.o. female with medical history significant for [facility] liver cirrhosis, type II DM, CKD stage IIIa, hypothyroidism, who presented to the hospital because of generalized weakness.  She was admitted with hepatic encephalopathy as well as worsening ascites in the setting of [place] liver cirrhosis.  Her mental status has improved with the use of lactulose and rifaximin.  She has undergone paracentesis on 2/13 with 4.4 L of fluid removed.  She was noted to have initial AKI on CKD stage IIIa and has been referred to nephrology for further evaluation in the outpatient setting.  Home diuretics have not yet been started on account of the fact that she continues to have elevated creatinine levels and soft blood pressure readings.  She will need outpatient follow-up as noted above with GI as well as nephrology to decide on diuretic use.  She will remain on lactulose and rifaximin as otherwise prescribed and has home health physical therapy.  No other acute events noted throughout the course of the  stay.  Discharge Diagnoses:  Principal Problem:   Generalized weakness Active Problems:   Primary hypertension   Acute hyponatremia   Prolonged QT interval   Hypothyroidism   Acute hepatic encephalopathy  Principal discharge diagnosis: Acute hepatic encephalopathy with worsening ascites in the setting of [place] liver cirrhosis.  AKI on CKD stage IIIa.  Discharge Instructions  Discharge Instructions     Ambulatory referral to Gastroenterology   Complete by: As directed    Ambulatory referral to Nephrology   Complete by: As directed    Diet - low sodium heart healthy   Complete by: As directed    Increase activity slowly   Complete by: As directed       Allergies as of 11/22/2021   No Known Allergies      Medication List     STOP taking these medications    lisinopril-hydrochlorothiazide 20-12.5 MG tablet Commonly known as: ZESTORETIC       TAKE these medications    Accu-Chek FastClix Lancets Misc Apply topically.   Accu-Chek Guide test strip Generic drug: glucose blood   allopurinol 300 MG tablet Commonly known as: ZYLOPRIM Take 150 mg by mouth daily.   busPIRone 10 MG tablet Commonly known as: BUSPAR Take 10 mg by mouth daily.   citalopram 40 MG tablet Commonly known as: CELEXA Take 40 mg by mouth daily.   ferrous sulfate 325 (65 FE) MG tablet Take 1 tablet (325 mg total) by mouth daily with breakfast.   GLUCOSAMINE PO Take 1 tablet by mouth daily.   lactulose 10 GM/15ML solution Commonly known as: CHRONULAC Take 30 mLs (20 g total) by mouth 3 (three) times daily.   levothyroxine 100 MCG tablet Commonly known as:  SYNTHROID Take 100 mcg by mouth daily.   metoprolol succinate 100 MG 24 hr tablet Commonly known as: TOPROL-XL Take 1 tablet (100 mg total) by mouth daily. Take with or immediately following a meal. Start taking on: November 23, 2021   pantoprazole 40 MG tablet Commonly known as: Protonix Take 1 tablet (40 mg total) by mouth  in the morning.   pravastatin 40 MG tablet Commonly known as: PRAVACHOL Take 40 mg by mouth daily.   rifaximin 550 MG Tabs tablet Commonly known as: XIFAXAN Take 1 tablet (550 mg total) by mouth 2 (two) times daily.   vitamin C 500 MG tablet Commonly known as: ASCORBIC ACID Take 500 mg by mouth daily.   VITAMIN D3 PO Take 1 Dose by mouth daily.        Follow-up Information     [place], [place] Follow up.   Specialty: [place] [facility]: [facility] physical therapy/occupational therapy Contact information: [place] [address] [phone]         Maury Dus, MD. Schedule an appointment as soon as possible for a visit in 1 week(s).   Specialty: Family Medicine Contact information: [place] [address] [phone]         Yetta Flock, MD. Schedule an appointment as soon as possible for a visit.   Specialty: Gastroenterology Contact information: [place] [address] [phone]         [place], [place]. Schedule an appointment as soon as possible for a visit.   Contact information: [place] [address] [phone]                No Known Allergies  Consultations: None   Procedures/Studies: DG Chest 2 View  Result Date: 11/17/2021 CLINICAL DATA:  Generalized weakness over the last week EXAM: CHEST - 2 VIEW COMPARISON:  None. FINDINGS: There is left ventricular prominence. There is aortic atherosclerotic calcification. The lungs are clear. No infiltrate, collapse or effusion. No significant bone finding. IMPRESSION: No active cardiopulmonary disease. Left ventricular prominence. Aortic atherosclerotic calcification. Electronically Signed   By: Nelson Chimes M.D.   On: 11/17/2021 16:14   US Paracentesis  Result Date: 11/21/2021 INDICATION: Patient with history of NASH cirrhosis, chronic kidney disease, encephalopathy,  recurrent ascites. Request received for therapeutic paracentesis. EXAM: ULTRASOUND GUIDED THERAPEUTIC PARACENTESIS MEDICATIONS: 10 mL 1% lidocaine COMPLICATIONS: None immediate. PROCEDURE: Informed written consent was obtained from the patient/sister after a discussion of the risks, benefits and alternatives to treatment. A timeout was performed prior to the initiation of the procedure. Initial ultrasound scanning demonstrates a moderate to large amount of ascites within the right lower abdominal quadrant. The right lower abdomen was prepped and draped in the usual sterile fashion. 1% lidocaine was used for local anesthesia. Following this, a 19 gauge, 10-cm, Yueh catheter was introduced. An ultrasound image was saved for documentation purposes. The paracentesis was performed. The catheter was removed and a dressing was applied. The patient tolerated the procedure well without immediate post procedural complication. FINDINGS: A total of approximately 4.4 liters of yellow fluid was removed. IMPRESSION: Successful ultrasound-guided therapeutic paracentesis yielding 4.4 liters of peritoneal fluid. Read by: Rowe Robert, PA-C Electronically Signed   By: Ruthann Cancer M.D.   On: 11/21/2021 13:07   IR Paracentesis  Result Date: 11/15/2021 INDICATION: Ascites, cirrhosis EXAM: ULTRASOUND GUIDED THERAPEUTIC PARACENTESIS MEDICATIONS: None. COMPLICATIONS: None immediate. PROCEDURE: Informed written consent was  obtained from the patient after a discussion of the risks, benefits and alternatives to treatment. A timeout was performed prior to the initiation of the procedure. Initial ultrasound scanning demonstrates a large amount of ascites within the right lower abdominal quadrant. The right lower abdomen was prepped and draped in the usual sterile fashion. 1% lidocaine was used for local anesthesia. Following this, a 19 gauge, 7-cm, Yueh catheter was introduced. An ultrasound image was saved for documentation purposes. The  paracentesis was performed. The catheter was removed and a dressing was applied. The patient tolerated the procedure well without immediate post procedural complication. FINDINGS: A total of approximately 4.4L of serous ascitic fluid was removed. IMPRESSION: Successful ultrasound-guided paracentesis yielding 4.4 liters of peritoneal fluid. Performed and Read by Pasty Spillers, PA-C Electronically Signed   By: Michaelle Birks M.D.   On: 11/15/2021 15:42     Discharge Exam: Vitals:   11/22/21 0454 11/22/21 0813  BP: (!) 110/55   Pulse: (!) 56 62  Resp: 18   Temp: 98.3 F (36.8 C)   SpO2: 98%    Vitals:   11/21/21 2031 11/22/21 0454 11/22/21 0500 11/22/21 0813  BP: (!) 115/57 (!) 110/55    Pulse: (!) 57 (!) 56  62  Resp: 17 18    Temp: 98.1 F (36.7 C) 98.3 F (36.8 C)    TempSrc: Oral Oral    SpO2: 99% 98%    Weight:   104.7 kg   Height:        General: Pt is alert, awake, not in acute distress Cardiovascular: RRR, S1/S2 +, no rubs, no gallops Respiratory: CTA bilaterally, no wheezing, no rhonchi Abdominal: Soft, NT, ND, bowel sounds + Extremities: no edema, no cyanosis    The results of significant diagnostics from this hospitalization (including imaging, microbiology, ancillary and laboratory) are listed below for reference.     Microbiology: Recent Results (from the past 240 hour(s))  Resp Panel by RT-PCR (Flu A&B, Covid) Nasopharyngeal Swab     Status: None   Collection Time: 11/17/21  7:53 PM   Specimen: Nasopharyngeal Swab; Nasopharyngeal(NP) swabs in vial transport medium  Result Value Ref Range Status   SARS Coronavirus 2 by RT PCR NEGATIVE NEGATIVE Final    Comment: (NOTE) SARS-CoV-2 target nucleic acids are NOT DETECTED.  The SARS-CoV-2 RNA is generally detectable in upper respiratory specimens during the acute phase of infection. The lowest concentration of SARS-CoV-2 viral copies this assay can detect is 138 copies/mL. A negative result does not preclude  SARS-Cov-2 infection and should not be used as the sole basis for treatment or other patient management decisions. A negative result may occur with  improper specimen collection/handling, submission of specimen other than nasopharyngeal swab, presence of viral mutation(s) within the areas targeted by this assay, and inadequate number of viral copies(<138 copies/mL). A negative result must be combined with clinical observations, patient history, and epidemiological information. The expected result is Negative.  Fact Sheet for Patients:  EntrepreneurPulse.com.au  Fact Sheet for Healthcare Providers:  IncredibleEmployment.be  This test is no t yet approved or cleared by the [place] FDA and  has been authorized for detection and/or diagnosis of SARS-CoV-2 by FDA under an Emergency Use Authorization (EUA). This EUA will remain  in effect (meaning this test can be used) for the duration of the COVID-19 declaration under Section 564(b)(1) of the Act, 21 U.S.C.section 360bbb-3(b)(1), unless the authorization is terminated  or revoked sooner.       Influenza A by  PCR NEGATIVE NEGATIVE Final   Influenza B by PCR NEGATIVE NEGATIVE Final    Comment: (NOTE) The Xpert Xpress SARS-CoV-2/FLU/RSV plus assay is intended as an aid in the diagnosis of influenza from Nasopharyngeal swab specimens and should not be used as a sole basis for treatment. Nasal washings and aspirates are unacceptable for Xpert Xpress SARS-CoV-2/FLU/RSV testing.  Fact Sheet for Patients: EntrepreneurPulse.com.au  Fact Sheet for Healthcare Providers: IncredibleEmployment.be  This test is not yet approved or cleared by the [place] FDA and has been authorized for detection and/or diagnosis of SARS-CoV-2 by FDA under an Emergency Use Authorization (EUA). This EUA will remain in effect (meaning this test can be used) for the duration of  the COVID-19 declaration under Section 564(b)(1) of the Act, 21 U.S.C. section 360bbb-3(b)(1), unless the authorization is terminated or revoked.  Performed at [facility], [place] [address] [place], [place] [address]      Labs: BNP (last 3 results) Recent Labs    11/17/21 1545  BNP 40.1   Basic Metabolic Panel: Recent Labs  Lab 11/15/21 1244 11/17/21 1544 11/18/21 0455 11/20/21 0450 11/21/21 0429  NA 133* 132* 134* 133* 133*  K 4.4 4.7 4.3 4.1 4.1  CL 102 101 104 104 104  CO2 26 23 23 24  21*  GLUCOSE 109* 110* 99 92 86  BUN 35* 33* 33* 39* 41*  CREATININE 1.95* 1.70* 1.66* 2.11* 1.77*  CALCIUM 9.1 8.8* 8.7* 8.5* 8.6*  MG  --  2.0 2.0  --  2.1  PHOS  --   --  3.3  --  4.1   Liver Function Tests: Recent Labs  Lab 11/17/21 1544 11/18/21 0455  AST 121* 89*  ALT 46* 41  ALKPHOS 124 104  BILITOT 3.6* 3.3*  PROT 6.7 6.0*  ALBUMIN 2.4* 2.2*   No results for input(s): LIPASE, AMYLASE in the last 168 hours. Recent Labs  Lab 11/17/21 1544 11/19/21 1234 11/20/21 0450 11/21/21 0429  AMMONIA 125* 85* 100* 103*   CBC: Recent Labs  Lab 11/17/21 1544 11/18/21 0455 11/20/21 0450 11/21/21 0429  WBC 10.0 8.3 7.5 8.1  NEUTROABS 5.9 4.9 4.2 4.2  HGB 10.7* 8.9* 8.9* 9.1*  HCT 30.9* 25.3* 26.7* 26.6*  MCV 103.0* 101.2* 105.1* 102.7*  PLT 170 153 141* 147*   Cardiac Enzymes: No results for input(s): CKTOTAL, CKMB, CKMBINDEX, TROPONINI in the last 168 hours. BNP: Invalid input(s): POCBNP CBG: Recent Labs  Lab 11/21/21 0734 11/21/21 1142 11/21/21 1655 11/21/21 2037 11/22/21 0744  GLUCAP 83 105* 103* 135* 91   D-Dimer No results for input(s): DDIMER in the last 72 hours. Hgb A1c No results for input(s): HGBA1C in the last 72 hours. Lipid Profile No results for input(s): CHOL, HDL, LDLCALC, TRIG, CHOLHDL, LDLDIRECT in the last 72 hours. Thyroid function studies No results for input(s): TSH, T4TOTAL, T3FREE, THYROIDAB in the last 72  hours.  Invalid input(s): FREET3 Anemia work up Recent Labs    11/20/21 1204  VITAMINB12 1,180*   Urinalysis    Component Value Date/Time   COLORURINE AMBER (A) 11/18/2021 0600   APPEARANCEUR CLEAR 11/18/2021 0600   LABSPEC 1.014 11/18/2021 0600   PHURINE 5.0 11/18/2021 0600   GLUCOSEU NEGATIVE 11/18/2021 0600   HGBUR NEGATIVE 11/18/2021 0600   BILIRUBINUR NEGATIVE 11/18/2021 0600   KETONESUR NEGATIVE 11/18/2021 0600   PROTEINUR NEGATIVE 11/18/2021 0600   NITRITE NEGATIVE 11/18/2021 0600   LEUKOCYTESUR TRACE (A) 11/18/2021 0600   Sepsis Labs Invalid input(s): PROCALCITONIN,  WBC,  [place] Microbiology Recent Results (from the past 240 hour(s))  Resp Panel by RT-PCR (Flu A&B, Covid) Nasopharyngeal Swab     Status: None   Collection Time: 11/17/21  7:53 PM   Specimen: Nasopharyngeal Swab; Nasopharyngeal(NP) swabs in vial transport medium  Result Value Ref Range Status   SARS Coronavirus 2 by RT PCR NEGATIVE NEGATIVE Final    Comment: (NOTE) SARS-CoV-2 target nucleic acids are NOT DETECTED.  The SARS-CoV-2 RNA is generally detectable in upper respiratory specimens during the acute phase of infection. The lowest concentration of SARS-CoV-2 viral copies this assay can detect is 138 copies/mL. A negative result does not preclude SARS-Cov-2 infection and should not be used as the sole basis for treatment or other patient management decisions. A negative result may occur with  improper specimen collection/handling, submission of specimen other than nasopharyngeal swab, presence of viral mutation(s) within the areas targeted by this assay, and inadequate number of viral copies(<138 copies/mL). A negative result must be combined with clinical observations, patient history, and epidemiological information. The expected result is Negative.  Fact Sheet for Patients:  EntrepreneurPulse.com.au  Fact Sheet for Healthcare Providers:   IncredibleEmployment.be  This test is no t yet approved or cleared by the [place] FDA and  has been authorized for detection and/or diagnosis of SARS-CoV-2 by FDA under an Emergency Use Authorization (EUA). This EUA will remain  in effect (meaning this test can be used) for the duration of the COVID-19 declaration under Section 564(b)(1) of the Act, 21 U.S.C.section 360bbb-3(b)(1), unless the authorization is terminated  or revoked sooner.       Influenza A by PCR NEGATIVE NEGATIVE Final   Influenza B by PCR NEGATIVE NEGATIVE Final    Comment: (NOTE) The Xpert Xpress SARS-CoV-2/FLU/RSV plus assay is intended as an aid in the diagnosis of influenza from Nasopharyngeal swab specimens and should not be used as a sole basis for treatment. Nasal washings and aspirates are unacceptable for Xpert Xpress SARS-CoV-2/FLU/RSV testing.  Fact Sheet for Patients: EntrepreneurPulse.com.au  Fact Sheet for Healthcare Providers: IncredibleEmployment.be  This test is not yet approved or cleared by the [place] FDA and has been authorized for detection and/or diagnosis of SARS-CoV-2 by FDA under an Emergency Use Authorization (EUA). This EUA will remain in effect (meaning this test can be used) for the duration of the COVID-19 declaration under Section 564(b)(1) of the Act, 21 U.S.C. section 360bbb-3(b)(1), unless the authorization is terminated or revoked.  Performed at [facility], [place] [address] [place], [place] [address]      Time coordinating discharge: 35 minutes  SIGNED:   Rodena Goldmann, DO [facility] 11/22/2021, 9:40 AM  If 7PM-7AM, please contact night-coverage [email or web address]

## 2021-11-22 NOTE — Telephone Encounter (Signed)
Called patient and spoke to her care giver/daughter-in-law Helene Kelp). Gave her Dr. Doyne Keel comments reference hospital discharge instructions. Scheduled office visit with Alonza Bogus PA on 11/30/21

## 2021-11-24 ENCOUNTER — Telehealth: Payer: Self-pay | Admitting: Neurology

## 2021-11-24 ENCOUNTER — Encounter (HOSPITAL_COMMUNITY): Payer: Self-pay

## 2021-11-24 ENCOUNTER — Telehealth: Payer: Self-pay | Admitting: Gastroenterology

## 2021-11-24 ENCOUNTER — Ambulatory Visit: Payer: Medicare Other | Admitting: Gastroenterology

## 2021-11-24 ENCOUNTER — Other Ambulatory Visit: Payer: Self-pay

## 2021-11-24 ENCOUNTER — Inpatient Hospital Stay (HOSPITAL_COMMUNITY)
Admission: EM | Admit: 2021-11-24 | Discharge: 2021-11-26 | DRG: 442 | Disposition: A | Discharge status: Hospice | Payer: Medicare Other | Attending: Family Medicine | Admitting: Family Medicine

## 2021-11-24 DIAGNOSIS — E039 Hypothyroidism, unspecified: Secondary | ICD-10-CM | POA: Diagnosis not present

## 2021-11-24 DIAGNOSIS — K7469 Other cirrhosis of liver: Secondary | ICD-10-CM | POA: Diagnosis not present

## 2021-11-24 DIAGNOSIS — R531 Weakness: Secondary | ICD-10-CM | POA: Diagnosis not present

## 2021-11-24 DIAGNOSIS — Z79899 Other long term (current) drug therapy: Secondary | ICD-10-CM

## 2021-11-24 DIAGNOSIS — Z20822 Contact with and (suspected) exposure to covid-19: Secondary | ICD-10-CM | POA: Diagnosis not present

## 2021-11-24 DIAGNOSIS — R17 Unspecified jaundice: Secondary | ICD-10-CM | POA: Diagnosis not present

## 2021-11-24 DIAGNOSIS — N183 Chronic kidney disease, stage 3 unspecified: Secondary | ICD-10-CM | POA: Diagnosis not present

## 2021-11-24 DIAGNOSIS — E785 Hyperlipidemia, unspecified: Secondary | ICD-10-CM | POA: Diagnosis not present

## 2021-11-24 DIAGNOSIS — K760 Fatty (change of) liver, not elsewhere classified: Secondary | ICD-10-CM | POA: Diagnosis not present

## 2021-11-24 DIAGNOSIS — K7682 Hepatic encephalopathy: Secondary | ICD-10-CM | POA: Diagnosis not present

## 2021-11-24 DIAGNOSIS — Z79890 Hormone replacement therapy (postmenopausal): Secondary | ICD-10-CM | POA: Diagnosis not present

## 2021-11-24 DIAGNOSIS — R188 Other ascites: Secondary | ICD-10-CM | POA: Diagnosis present

## 2021-11-24 DIAGNOSIS — Z9049 Acquired absence of other specified parts of digestive tract: Secondary | ICD-10-CM

## 2021-11-24 DIAGNOSIS — M109 Gout, unspecified: Secondary | ICD-10-CM | POA: Diagnosis not present

## 2021-11-24 DIAGNOSIS — E119 Type 2 diabetes mellitus without complications: Secondary | ICD-10-CM | POA: Diagnosis present

## 2021-11-24 DIAGNOSIS — R946 Abnormal results of thyroid function studies: Secondary | ICD-10-CM | POA: Diagnosis not present

## 2021-11-24 DIAGNOSIS — K7460 Unspecified cirrhosis of liver: Secondary | ICD-10-CM | POA: Diagnosis present

## 2021-11-24 DIAGNOSIS — K766 Portal hypertension: Secondary | ICD-10-CM | POA: Diagnosis not present

## 2021-11-24 DIAGNOSIS — R55 Syncope and collapse: Secondary | ICD-10-CM | POA: Diagnosis not present

## 2021-11-24 DIAGNOSIS — N184 Chronic kidney disease, stage 4 (severe): Secondary | ICD-10-CM | POA: Diagnosis not present

## 2021-11-24 DIAGNOSIS — E1122 Type 2 diabetes mellitus with diabetic chronic kidney disease: Secondary | ICD-10-CM

## 2021-11-24 DIAGNOSIS — I10 Essential (primary) hypertension: Secondary | ICD-10-CM | POA: Diagnosis present

## 2021-11-24 DIAGNOSIS — Z7189 Other specified counseling: Secondary | ICD-10-CM | POA: Diagnosis not present

## 2021-11-24 DIAGNOSIS — Z6838 Body mass index (BMI) 38.0-38.9, adult: Secondary | ICD-10-CM | POA: Diagnosis not present

## 2021-11-24 DIAGNOSIS — K7581 Nonalcoholic steatohepatitis (NASH): Secondary | ICD-10-CM | POA: Diagnosis not present

## 2021-11-24 DIAGNOSIS — Z634 Disappearance and death of family member: Secondary | ICD-10-CM | POA: Diagnosis not present

## 2021-11-24 DIAGNOSIS — R0602 Shortness of breath: Secondary | ICD-10-CM | POA: Diagnosis not present

## 2021-11-24 DIAGNOSIS — E871 Hypo-osmolality and hyponatremia: Secondary | ICD-10-CM | POA: Diagnosis not present

## 2021-11-24 DIAGNOSIS — Z7401 Bed confinement status: Secondary | ICD-10-CM | POA: Diagnosis not present

## 2021-11-24 DIAGNOSIS — R6889 Other general symptoms and signs: Secondary | ICD-10-CM | POA: Diagnosis not present

## 2021-11-24 DIAGNOSIS — R001 Bradycardia, unspecified: Secondary | ICD-10-CM | POA: Diagnosis not present

## 2021-11-24 DIAGNOSIS — Z515 Encounter for palliative care: Secondary | ICD-10-CM | POA: Diagnosis not present

## 2021-11-24 DIAGNOSIS — I959 Hypotension, unspecified: Secondary | ICD-10-CM | POA: Diagnosis not present

## 2021-11-24 NOTE — Telephone Encounter (Signed)
Monique from [facility] called in. They need orders for PT 1 time a week for 1 week, then 2 times a week for 5 weeks for strengthening, transfers, gait, balance training, home exercise program, caregiver education, and home safety. Skilled nursing to evaluate and treat. A prescription faxed for a standard rolling walker.

## 2021-11-24 NOTE — ED Triage Notes (Signed)
Patient BIB [facility] from home. Generalized fatigue and weakness. Had 2 paracentesis recently with 1L taken off last Monday. More ascites noticed in the past week, jaundice. Couldn't get her off the bedside commode today because she was feeling so weak.

## 2021-11-24 NOTE — Telephone Encounter (Signed)
Auth#  [id number] good 11/24/21-01/08/22

## 2021-11-25 ENCOUNTER — Emergency Department (HOSPITAL_COMMUNITY): Payer: Medicare Other

## 2021-11-25 ENCOUNTER — Encounter (HOSPITAL_COMMUNITY): Payer: Self-pay | Admitting: Internal Medicine

## 2021-11-25 DIAGNOSIS — N184 Chronic kidney disease, stage 4 (severe): Secondary | ICD-10-CM | POA: Diagnosis not present

## 2021-11-25 DIAGNOSIS — K7460 Unspecified cirrhosis of liver: Secondary | ICD-10-CM | POA: Diagnosis present

## 2021-11-25 DIAGNOSIS — Z79890 Hormone replacement therapy (postmenopausal): Secondary | ICD-10-CM | POA: Diagnosis not present

## 2021-11-25 DIAGNOSIS — E785 Hyperlipidemia, unspecified: Secondary | ICD-10-CM | POA: Diagnosis present

## 2021-11-25 DIAGNOSIS — K7682 Hepatic encephalopathy: Secondary | ICD-10-CM | POA: Diagnosis present

## 2021-11-25 DIAGNOSIS — R188 Other ascites: Secondary | ICD-10-CM | POA: Diagnosis present

## 2021-11-25 DIAGNOSIS — Z6838 Body mass index (BMI) 38.0-38.9, adult: Secondary | ICD-10-CM | POA: Diagnosis not present

## 2021-11-25 DIAGNOSIS — N183 Chronic kidney disease, stage 3 unspecified: Secondary | ICD-10-CM

## 2021-11-25 DIAGNOSIS — Z515 Encounter for palliative care: Secondary | ICD-10-CM | POA: Diagnosis not present

## 2021-11-25 DIAGNOSIS — M109 Gout, unspecified: Secondary | ICD-10-CM | POA: Diagnosis present

## 2021-11-25 DIAGNOSIS — R946 Abnormal results of thyroid function studies: Secondary | ICD-10-CM | POA: Diagnosis present

## 2021-11-25 DIAGNOSIS — E119 Type 2 diabetes mellitus without complications: Secondary | ICD-10-CM | POA: Diagnosis present

## 2021-11-25 DIAGNOSIS — K7581 Nonalcoholic steatohepatitis (NASH): Secondary | ICD-10-CM | POA: Diagnosis present

## 2021-11-25 DIAGNOSIS — Z20822 Contact with and (suspected) exposure to covid-19: Secondary | ICD-10-CM | POA: Diagnosis present

## 2021-11-25 DIAGNOSIS — R531 Weakness: Secondary | ICD-10-CM | POA: Diagnosis not present

## 2021-11-25 DIAGNOSIS — Z7189 Other specified counseling: Secondary | ICD-10-CM | POA: Diagnosis not present

## 2021-11-25 DIAGNOSIS — Z634 Disappearance and death of family member: Secondary | ICD-10-CM | POA: Diagnosis not present

## 2021-11-25 DIAGNOSIS — E039 Hypothyroidism, unspecified: Secondary | ICD-10-CM | POA: Diagnosis present

## 2021-11-25 DIAGNOSIS — E1122 Type 2 diabetes mellitus with diabetic chronic kidney disease: Secondary | ICD-10-CM

## 2021-11-25 DIAGNOSIS — K760 Fatty (change of) liver, not elsewhere classified: Secondary | ICD-10-CM | POA: Insufficient documentation

## 2021-11-25 DIAGNOSIS — I10 Essential (primary) hypertension: Secondary | ICD-10-CM | POA: Diagnosis present

## 2021-11-25 DIAGNOSIS — E871 Hypo-osmolality and hyponatremia: Secondary | ICD-10-CM | POA: Diagnosis not present

## 2021-11-25 DIAGNOSIS — Z79899 Other long term (current) drug therapy: Secondary | ICD-10-CM | POA: Diagnosis not present

## 2021-11-25 DIAGNOSIS — N1831 Chronic kidney disease, stage 3a: Secondary | ICD-10-CM | POA: Insufficient documentation

## 2021-11-25 DIAGNOSIS — K766 Portal hypertension: Secondary | ICD-10-CM | POA: Diagnosis present

## 2021-11-25 DIAGNOSIS — R55 Syncope and collapse: Secondary | ICD-10-CM | POA: Diagnosis present

## 2021-11-25 DIAGNOSIS — Z9049 Acquired absence of other specified parts of digestive tract: Secondary | ICD-10-CM | POA: Diagnosis not present

## 2021-11-25 LAB — COMPREHENSIVE METABOLIC PANEL
ALT: 56 U/L — ABNORMAL HIGH (ref 0–44)
AST: 128 U/L — ABNORMAL HIGH (ref 15–41)
Albumin: 2.1 g/dL — ABNORMAL LOW (ref 3.5–5.0)
Alkaline Phosphatase: 108 U/L (ref 38–126)
Anion gap: 8 (ref 5–15)
BUN: 40 mg/dL — ABNORMAL HIGH (ref 8–23)
CO2: 21 mmol/L — ABNORMAL LOW (ref 22–32)
Calcium: 8.9 mg/dL (ref 8.9–10.3)
Chloride: 103 mmol/L (ref 98–111)
Creatinine, Ser: 1.96 mg/dL — ABNORMAL HIGH (ref 0.44–1.00)
GFR, Estimated: 26 mL/min — ABNORMAL LOW (ref >60)
Glucose, Bld: 119 mg/dL — ABNORMAL HIGH (ref 70–99)
Potassium: 5 mmol/L (ref 3.5–5.1)
Sodium: 132 mmol/L — ABNORMAL LOW (ref 135–145)
Total Bilirubin: 2.7 mg/dL — ABNORMAL HIGH (ref 0.3–1.2)
Total Protein: 6.2 g/dL — ABNORMAL LOW (ref 6.5–8.1)

## 2021-11-25 LAB — CBC WITH DIFFERENTIAL/PLATELET
Abs Immature Granulocytes: 0.05 10*3/uL (ref 0.00–0.07)
Basophils Absolute: 0.1 10*3/uL (ref 0.0–0.1)
Basophils Relative: 1 %
Eosinophils Absolute: 0.4 10*3/uL (ref 0.0–0.5)
Eosinophils Relative: 5 %
HCT: 31 % — ABNORMAL LOW (ref 36.0–46.0)
Hemoglobin: 10.4 g/dL — ABNORMAL LOW (ref 12.0–15.0)
Immature Granulocytes: 1 %
Lymphocytes Relative: 20 %
Lymphs Abs: 2 10*3/uL (ref 0.7–4.0)
MCH: 35 pg — ABNORMAL HIGH (ref 26.0–34.0)
MCHC: 33.5 g/dL (ref 30.0–36.0)
MCV: 104.4 fL — ABNORMAL HIGH (ref 80.0–100.0)
Monocytes Absolute: 1.1 10*3/uL — ABNORMAL HIGH (ref 0.1–1.0)
Monocytes Relative: 11 %
Neutro Abs: 6.3 10*3/uL (ref 1.7–7.7)
Neutrophils Relative %: 62 %
Platelets: 172 10*3/uL (ref 150–400)
RBC: 2.97 MIL/uL — ABNORMAL LOW (ref 3.87–5.11)
RDW: 18.8 % — ABNORMAL HIGH (ref 11.5–15.5)
WBC: 9.9 10*3/uL (ref 4.0–10.5)
nRBC: 0 % (ref 0.0–0.2)

## 2021-11-25 LAB — RESP PANEL BY RT-PCR (FLU A&B, COVID) ARPGX2
Influenza A by PCR: NEGATIVE
Influenza B by PCR: NEGATIVE
SARS Coronavirus 2 by RT PCR: NEGATIVE

## 2021-11-25 LAB — TSH: TSH: 19.37 u[IU]/mL — ABNORMAL HIGH (ref 0.350–4.500)

## 2021-11-25 LAB — HEMOGLOBIN A1C
Hgb A1c MFr Bld: 4.4 % — ABNORMAL LOW (ref 4.8–5.6)
Mean Plasma Glucose: 79.58 mg/dL

## 2021-11-25 LAB — CBG MONITORING, ED
Glucose-Capillary: 101 mg/dL — ABNORMAL HIGH (ref 70–99)
Glucose-Capillary: 109 mg/dL — ABNORMAL HIGH (ref 70–99)

## 2021-11-25 LAB — AMMONIA: Ammonia: 114 umol/L — ABNORMAL HIGH (ref 9–35)

## 2021-11-25 LAB — PROTIME-INR
INR: 1.5 — ABNORMAL HIGH (ref 0.8–1.2)
Prothrombin Time: 18.2 seconds — ABNORMAL HIGH (ref 11.4–15.2)

## 2021-11-25 LAB — GLUCOSE, CAPILLARY
Glucose-Capillary: 117 mg/dL — ABNORMAL HIGH (ref 70–99)
Glucose-Capillary: 133 mg/dL — ABNORMAL HIGH (ref 70–99)

## 2021-11-25 MED ORDER — LEVOTHYROXINE SODIUM 100 MCG PO TABS
100.0000 ug | ORAL_TABLET | Freq: Every day | ORAL | Status: DC
  Administered 2021-11-25: 100 ug via ORAL
  Filled 2021-11-25: qty 1

## 2021-11-25 MED ORDER — LACTULOSE 10 GM/15ML PO SOLN
30.0000 g | Freq: Once | ORAL | Status: AC
  Administered 2021-11-25: 30 g via ORAL
  Filled 2021-11-25: qty 60

## 2021-11-25 MED ORDER — INSULIN ASPART 100 UNIT/ML IJ SOLN
0.0000 [IU] | Freq: Three times a day (TID) | INTRAMUSCULAR | Status: DC
  Filled 2021-11-25: qty 0.09

## 2021-11-25 MED ORDER — LACTULOSE 10 GM/15ML PO SOLN
20.0000 g | Freq: Three times a day (TID) | ORAL | Status: DC
  Administered 2021-11-25 – 2021-11-26 (×4): 20 g via ORAL
  Filled 2021-11-25 (×4): qty 30

## 2021-11-25 MED ORDER — ALBUMIN HUMAN 25 % IV SOLN
50.0000 g | Freq: Once | INTRAVENOUS | Status: DC
  Filled 2021-11-25: qty 200

## 2021-11-25 MED ORDER — ALBUMIN HUMAN 25 % IV SOLN: 50.0000 g | Freq: Once | INTRAVENOUS | Status: DC

## 2021-11-25 MED ORDER — INSULIN ASPART 100 UNIT/ML IJ SOLN
0.0000 [IU] | Freq: Every day | INTRAMUSCULAR | Status: DC
  Filled 2021-11-25: qty 0.05

## 2021-11-25 MED ORDER — RIFAXIMIN 550 MG PO TABS
550.0000 mg | ORAL_TABLET | Freq: Two times a day (BID) | ORAL | Status: DC
  Administered 2021-11-25 – 2021-11-26 (×3): 550 mg via ORAL
  Filled 2021-11-25 (×4): qty 1

## 2021-11-25 NOTE — Assessment & Plan Note (Signed)
Patient is needing nearly weekly paracentesis now.  She has not had a episode of spontaneous bacterial peritonitis yet.  Discussed with the patient's brother Timmothy Sours that repeated episodes of paracentesis only increases her risk for SBP.  In that repeated paracenteses will likely cause more third spacing as her serum protein and serum albumin stores are depleted.

## 2021-11-25 NOTE — ED Notes (Signed)
Breakfast tray given. °

## 2021-11-25 NOTE — Telephone Encounter (Signed)
LMOVM

## 2021-11-25 NOTE — Progress Notes (Signed)
Patient seen after midnight  HPI reviewed, consultant notes from GI reviewed Her mentation is much better than prior-she tells me that she would want an attempt at all measures but needs to think about the fact that she has a life limiting illness I went into some detail with her with regards to what cirrhosis means and that her liver is not doing well Patient identifies Ann Mcconnell her daughter-in-law as her point of contact [phone]  Patient wasn't taking Rifaxamin,was taking Lactulose Palliative care consulted to come in--not really a TIPS candidate.  Ann Mcconnell states things looked reasonable last week--she was scared about how patient became confused last pm--she is understanding my input re: Hospice house placement vs home with Hospice I think her prognosis is gaurded   No charge Expect will need to discuss Hopsice and EOL care and placement in 2-3 days   Verneita Griffes, MD [facility] 4:38 PM

## 2021-11-25 NOTE — Assessment & Plan Note (Signed)
Chronic.  Likely the cause of her Ann Mcconnell cirrhosis.

## 2021-11-25 NOTE — H&P (Signed)
History and Physical    Ann Mcconnell ELF:[id number] DOB: April 28, 1946 DOA: 11/24/2021  DOS: the patient was seen and examined on 11/24/2021  PCP: Ann Dus, MD   Patient coming from: Home  I have personally briefly reviewed patient's old medical records in [place]  CC: weakness, confusion HPI: 76 yo WF with hx of Ann Mcconnell cirrhosis with ascites, Hepatic encephalopathy, type 2 diabetes, morbid obesity, hypothyroidism presents to the ER today with weakness and syncope after having bowel movement at home.  Patient recently discharged from the hospital on 11/22/2021.  Patient underwent paracentesis for 4.4 L.  Patient unable to give any history review of systems due to confusion.  Patient is here in the ER with her brother Ann Mcconnell.  He states the patient was confused today.  She was unable to go to the bathroom and had to sit on the bedside commode.  Patient passed out after having a bowel movement.  She was unable to get off the bedside commode.  She was brought to the ER.  Patient's condition has been steadily declining over the last several months.  Patient is confused all the time now.  Daughter states that patient is taking her Ann Mcconnell and her Ann Mcconnell.  Patient lives with her daughter.  Patient's daughter states that the son Ann Mcconnell that her power of attorney.  Brother would like a GI consult to go over prognosis with the patient and her family.  He understands that the patient is near end-stage and that hospice would be most appropriate for her.  However he is not the decision maker for her and he wants family especially the patient's children but to hear from the GI doctor.  He does request a palliative care consult.  [facility] contacted for admission.   ED Course: encephalopathic in ER.   Review of Systems:  Review of Systems  Unable to perform ROS: Mental status change   Past Medical History:  Diagnosis Date   Acute upper GI bleed 05/02/2021    Cirrhosis ([place])    Diabetes mellitus without complication (HCC)    DOE (dyspnea on exertion)    Gastrointestinal hemorrhage 04/30/2021   GAVE (gastric antral vascular ectasia)    Gout    HLD (hyperlipidemia)    HTN (hypertension)    Hypothyroidism    Tachycardia     Past Surgical History:  Procedure Laterality Date   Arm Surgery     BREAST REDUCTION SURGERY     CHOLECYSTECTOMY     COLONOSCOPY WITH PROPOFOL N/A 05/02/2021   Procedure: COLONOSCOPY WITH PROPOFOL;  Surgeon: Ann Pole, MD;  Location: [facility];  Service: Endoscopy;  Laterality: N/A;   ESOPHAGOGASTRODUODENOSCOPY (EGD) WITH PROPOFOL N/A 05/02/2021   Procedure: ESOPHAGOGASTRODUODENOSCOPY (EGD) WITH PROPOFOL;  Surgeon: Ann Pole, MD;  Location: [facility];  Service: Endoscopy;  Laterality: N/A;   HOT HEMOSTASIS N/A 05/02/2021   Procedure: HOT HEMOSTASIS (ARGON PLASMA COAGULATION/BICAP);  Surgeon: Ann Pole, MD;  Location: [facility];  Service: Endoscopy;  Laterality: N/A;   IR PARACENTESIS  11/15/2021   REDUCTION MAMMAPLASTY       reports that she has never smoked. She has never used smokeless tobacco. She reports that she does not drink alcohol and does not use drugs.  No Known Allergies  Family History  Problem Relation Age of Onset   Heart attack Other    Lymphoma Other    COPD Other    Thyroid disease Other    Sleep apnea Other  Prior to Admission medications   Medication Sig Start Date End Date Taking? Authorizing Provider  allopurinol (ZYLOPRIM) 300 MG tablet Take 150 mg by mouth daily.   Yes [provider]  busPIRone (BUSPAR) 10 MG tablet Take 10 mg by mouth daily.   Yes [provider]  citalopram (CELEXA) 40 MG tablet Take 40 mg by mouth daily. 03/31/21  Yes [provider]  ferrous sulfate 325 (65 FE) MG tablet Take 1 tablet (325 mg total) by mouth daily with breakfast. 07/28/21  Yes Armbruster, Carlota Raspberry, MD  Glucosamine HCl (GLUCOSAMINE PO)  Take 1 tablet by mouth daily.   Yes [provider]  Ann Mcconnell (CHRONULAC) 10 GM/15ML solution Take 30 mLs (20 g total) by mouth 3 (three) times daily. 11/22/21  Yes Ann Mcconnell, Ann D, DO  levothyroxine (SYNTHROID) 100 MCG tablet Take 100 mcg by mouth daily. 02/09/21  Yes [provider]  metoprolol succinate (TOPROL-XL) 100 MG 24 hr tablet Take 1 tablet (100 mg total) by mouth daily. Take with or immediately following a meal. 11/23/21 12/23/21 Yes Mcconnell, Ann D, DO  pravastatin (PRAVACHOL) 40 MG tablet Take 40 mg by mouth daily.   Yes [provider]  rifaximin (Ann Mcconnell) 550 MG TABS tablet Take 1 tablet (550 mg total) by mouth 2 (two) times daily. 11/22/21 12/22/21 Yes Mcconnell, Ann D, DO  vitamin C (ASCORBIC ACID) 500 MG tablet Take 500 mg by mouth daily.   Yes [provider]  Accu-Chek FastClix Lancets MISC Apply topically. 06/01/21   [provider]  ACCU-CHEK GUIDE test strip  05/27/21   [provider]  Cholecalciferol (VITAMIN D3 PO) Take 1 Dose by mouth daily. Patient not taking: Reported on 11/17/2021    [provider]  pantoprazole (PROTONIX) 40 MG tablet Take 1 tablet (40 mg total) by mouth in the morning. Patient not taking: Reported on 11/17/2021 06/06/21 08/05/21  Noralyn Pick, NP    Physical Exam: Vitals:   11/25/21 0000 11/25/21 0030 11/25/21 0102 11/25/21 0240  BP: (!) 101/47 109/74  (!) 93/43  Pulse: (!) 51 (!) 51  (!) 54  Resp: 17 16  12   Temp:   (!) 97 F (36.1 C)   TempSrc:   Rectal   SpO2: 100% 100%  100%  Weight:      Height:        Physical Exam Vitals and nursing note reviewed.  Constitutional:      General: She is not in acute distress.    Appearance: She is obese. She is not toxic-appearing or diaphoretic.     Comments: Appears chronically ill.  HENT:     Head: Normocephalic and atraumatic.     Nose: Nose normal. No rhinorrhea.  Cardiovascular:     Rate and Rhythm: Bradycardia present.      Pulses: Normal pulses.  Pulmonary:     Effort: No respiratory distress.     Breath sounds: No wheezing or rales.  Abdominal:     General: Bowel sounds are normal. There is distension.     Palpations: There is fluid wave and hepatomegaly.     Tenderness: There is no abdominal tenderness.  Musculoskeletal:     Right lower leg: Edema present.     Left lower leg: Edema present.  Skin:    General: Skin is warm and dry.     Capillary Refill: Capillary refill takes less than 2 seconds.  Neurological:     Mental Status: She is disoriented.  Labs on Admission: I have personally reviewed following labs and imaging studies  CBC: Recent Labs  Lab 11/18/21 0455 11/20/21 0450 11/21/21 0429 11/25/21 0030  WBC 8.3 7.5 8.1 9.9  NEUTROABS 4.9 4.2 4.2 6.3  HGB 8.9* 8.9* 9.1* 10.4*  HCT 25.3* 26.7* 26.6* 31.0*  MCV 101.2* 105.1* 102.7* 104.4*  PLT 153 141* 147* 967   Basic Metabolic Panel: Recent Labs  Lab 11/18/21 0455 11/20/21 0450 11/21/21 0429 11/25/21 0030  NA 134* 133* 133* 132*  K 4.3 4.1 4.1 5.0  CL 104 104 104 103  CO2 23 24 21* 21*  GLUCOSE 99 92 86 119*  BUN 33* 39* 41* 40*  CREATININE 1.66* 2.11* 1.77* 1.96*  CALCIUM 8.7* 8.5* 8.6* 8.9  MG 2.0  --  2.1  --   PHOS 3.3  --  4.1  --    GFR: Estimated Creatinine Clearance: 30.4 mL/min (A) (by C-G formula based on SCr of 1.96 mg/dL (H)). Liver Function Tests: Recent Labs  Lab 11/18/21 0455 11/25/21 0030  AST 89* 128*  ALT 41 56*  ALKPHOS 104 108  BILITOT 3.3* 2.7*  PROT 6.0* 6.2*  ALBUMIN 2.2* 2.1*   No results for input(s): LIPASE, AMYLASE in the last 168 hours. Recent Labs  Lab 11/19/21 1234 11/20/21 0450 11/21/21 0429 11/25/21 0030  AMMONIA 85* 100* 103* 114*   Coagulation Profile: Recent Labs  Lab 11/18/21 0455  INR 1.5*   Cardiac Enzymes: No results for input(s): CKTOTAL, CKMB, CKMBINDEX, TROPONINI in the last 168 hours. BNP (last 3 results) No results for input(s): PROBNP in the last  8760 hours. HbA1C: No results for input(s): HGBA1C in the last 72 hours. CBG: Recent Labs  Lab 11/21/21 1142 11/21/21 1655 11/21/21 2037 11/22/21 0744 11/22/21 1127  GLUCAP 105* 103* 135* 91 111*   Lipid Profile: No results for input(s): CHOL, HDL, LDLCALC, TRIG, CHOLHDL, LDLDIRECT in the last 72 hours. Thyroid Function Tests: Recent Labs    11/25/21 0030  TSH 19.370*   Anemia Panel: No results for input(s): VITAMINB12, FOLATE, FERRITIN, TIBC, IRON, RETICCTPCT in the last 72 hours. Urine analysis:    Component Value Date/Time   COLORURINE AMBER (A) 11/18/2021 0600   APPEARANCEUR CLEAR 11/18/2021 0600   LABSPEC 1.014 11/18/2021 0600   PHURINE 5.0 11/18/2021 0600   GLUCOSEU NEGATIVE 11/18/2021 0600   HGBUR NEGATIVE 11/18/2021 0600   BILIRUBINUR NEGATIVE 11/18/2021 0600   KETONESUR NEGATIVE 11/18/2021 0600   PROTEINUR NEGATIVE 11/18/2021 0600   NITRITE NEGATIVE 11/18/2021 0600   LEUKOCYTESUR TRACE (A) 11/18/2021 0600    Radiological Exams on Admission: I have personally reviewed images CT Head Wo Contrast  Result Date: 11/25/2021 CLINICAL DATA:  Mental status change, unknown cause EXAM: CT HEAD WITHOUT CONTRAST TECHNIQUE: Contiguous axial images were obtained from the base of the skull through the vertex without intravenous contrast. RADIATION DOSE REDUCTION: This exam was performed according to the departmental dose-optimization program which includes automated exposure control, adjustment of the mA and/or kV according to patient size and/or use of iterative reconstruction technique. COMPARISON:  CT head 10/17/2021 BRAIN: BRAIN Patchy and confluent areas of decreased attenuation are noted throughout the deep and periventricular white matter of the cerebral hemispheres bilaterally, compatible with chronic microvascular ischemic disease. No evidence of large-territorial acute infarction. No parenchymal hemorrhage. No mass lesion. No extra-axial collection. No mass effect or  midline shift. No hydrocephalus. Basilar cisterns are patent. Vascular: No hyperdense vessel. Atherosclerotic calcifications are present within the cavernous internal carotid arteries. Skull: No  acute fracture or focal lesion. Sinuses/Orbits: Paranasal sinuses and mastoid air cells are clear. The orbits are unremarkable. Other: None. IMPRESSION: No acute intracranial abnormality. Electronically Signed   By: Iven Finn M.D.   On: 11/25/2021 01:50   DG Chest Port 1 View  Result Date: 11/25/2021 CLINICAL DATA:  Shortness of breath EXAM: PORTABLE CHEST 1 VIEW COMPARISON:  11/17/2021 FINDINGS: The heart size and mediastinal contours are within normal limits. Both lungs are clear. The visualized skeletal structures are unremarkable. IMPRESSION: No active disease. Electronically Signed   By: Ulyses Jarred M.D.   On: 11/25/2021 01:58    EKG: I have personally reviewed EKG: NSR  Assessment/Plan Principal Problem:   Hepatic encephalopathy Active Problems:   Cirrhosis of liver with ascites (HCC)   Generalized weakness   Hypothyroidism   Morbid obesity (HCC)   Type 2 diabetes mellitus with chronic kidney disease, without long-term current use of insulin (HCC)    Assessment and Plan: * Hepatic encephalopathy- (present on admission) Admit to medical bed.  Continue Ann Mcconnell and Ann Mcconnell.  Patient appears near end-stage liver disease.  I do not think she is a TIPS candidate as she has continued to have significant encephalopathy.  Discussed with the brother that after TIPS procedure, patient's family have worsening of their encephalopathy.  Cirrhosis of liver with ascites ([place])- (present on admission) Patient is needing nearly weekly paracentesis now.  She has not had a episode of spontaneous bacterial peritonitis yet.  Discussed with the patient's brother Timmothy Sours that repeated episodes of paracentesis only increases her risk for SBP.  In that repeated paracenteses will likely cause more third spacing as  her serum protein and serum albumin stores are depleted.  Generalized weakness Patient has generalized weakness.  Unable to stand toilet.  Seen by PT.  Family confirms that home health came out to the house today for initial assessment.  Discussed with the patient's brother that the patient enrolled into hospice, she would no longer qualify for physical therapy.  Type 2 diabetes mellitus with chronic kidney disease, without long-term current use of insulin ([place]) Placed patient on sliding scale insulin only.  Patient's appetite's been poor.  Morbid obesity ([place])- (present on admission) Chronic.  Likely the cause of her Ann Mcconnell cirrhosis.  Hypothyroidism- (present on admission) TSH is quite elevated.  Reportedly patient is taking her Synthroid.  May need to change the dosing schedule to nightly so she is taking this on empty stomach.   DVT prophylaxis: SCDs Code Status: Full Code by default. Pt's brother states he could not make code status decision as he is not HCPOA for patient. Family Communication: discussed with pt's brother Ann Mcconnell at bedside  Disposition Plan: home hospice vs inpatient hospice  Consults called: none  Admission status: Inpatient, Med-Surg   Kristopher Oppenheim, DO [facility] 11/25/2021, 3:35 AM

## 2021-11-25 NOTE — Consult Note (Addendum)
Referring Provider: Dr. Bridgett Larsson, [facility] Primary Care Physician:  Maury Dus, MD Primary Gastroenterologist:  Dr. Havery Moros  Reason for Consultation:  Cirrhosis, ascites, HE  HPI: Ann Mcconnell is a 76 y.o. female with history of NASH cirrhosis with ascites, hepatic encephalopathy, type 2 diabetes, morbid obesity, hypothyroidism who presented to the ER with weakness and near syncope after having bowel movement at home.  Ammonia level 114.  Is on xifaxan 550 mg BID and lactulose at home and reports compliance with those.  Has required two paracenteses in the past 2 weeks due to rapid reaccumulation of fluid (4.4 Liters on 2/7 and 4.4 Liters on 2/13--both negative for SBP).  Not able to be placed on diuretics recently due to rising Cr.  TSH is elevated at 19, on synthroid at home.  Sodium slightly low at 132.  Cr 1.96.  LFTs fairly stable.  Patient just recently discharged from the hospital on 11/22/2021.   Per report from admitting physician:  "Pt's brother realizes how ill patient is.  He states he needs pt's immediate family(dtr and son) to be told what prognosis is for patient. He states this will help family understand that pt needs to be in hospice care. Brother(Don Iva Lento) states pt's husband died 3 years ago and all patient talks about it wanting to be with her dead husband.  Pt is now needing weekly paracentesis due to rapidly reaccumulating ascites. Doubt she is a TIPS candidate due to ongoing hepatic encephalopathy despite lactulose and xifaxan therapy."   Ultrasound 10/2021: IMPRESSION: 1. Cirrhosis with ascites. 2. Patent main portal vein with hepatopetal flow.   EGD 05/02/2021: - Z-line regular, 36 cm from the incisors. - No gross lesions in esophagus. - Gastric antral vascular ectasia. Treated with argon plasma coagulation (APC). Likely etiology for GI blood loss and anemia - Normal examined duodenum. - No specimens collected.   Colonoscopy 05/02/2021: - Non-bleeding  external and internal hemorrhoids. - The examination was otherwise normal. - No specimens collected. - No repeat colonoscopy due to age     Past Medical History:  Diagnosis Date   Acute upper GI bleed 05/02/2021   Cirrhosis ([place])    Diabetes mellitus without complication ([place])    DOE (dyspnea on exertion)    Gastrointestinal hemorrhage 04/30/2021   GAVE (gastric antral vascular ectasia)    Gout    HLD (hyperlipidemia)    HTN (hypertension)    Hypothyroidism    Tachycardia     Past Surgical History:  Procedure Laterality Date   Arm Surgery     BREAST REDUCTION SURGERY     CHOLECYSTECTOMY     COLONOSCOPY WITH PROPOFOL N/A 05/02/2021   Procedure: COLONOSCOPY WITH PROPOFOL;  Surgeon: Mauri Pole, MD;  Location: [facility]COPY;  Service: Endoscopy;  Laterality: N/A;   ESOPHAGOGASTRODUODENOSCOPY (EGD) WITH PROPOFOL N/A 05/02/2021   Procedure: ESOPHAGOGASTRODUODENOSCOPY (EGD) WITH PROPOFOL;  Surgeon: Mauri Pole, MD;  Location: [facility]OSCOPY;  Service: Endoscopy;  Laterality: N/A;   HOT HEMOSTASIS N/A 05/02/2021   Procedure: HOT HEMOSTASIS (ARGON PLASMA COAGULATION/BICAP);  Surgeon: Mauri Pole, MD;  Location: [facility];  Service: Endoscopy;  Laterality: N/A;   IR PARACENTESIS  11/15/2021   REDUCTION MAMMAPLASTY      Prior to Admission medications   Medication Sig Start Date End Date Taking? Authorizing Provider  allopurinol (ZYLOPRIM) 300 MG tablet Take 150 mg by mouth daily.   Yes [provider]  busPIRone (BUSPAR) 10 MG tablet Take 10 mg by mouth daily.  Yes [provider]  citalopram (CELEXA) 40 MG tablet Take 40 mg by mouth daily. 03/31/21  Yes [provider]  ferrous sulfate 325 (65 FE) MG tablet Take 1 tablet (325 mg total) by mouth daily with breakfast. 07/28/21  Yes Armbruster, Carlota Raspberry, MD  Glucosamine HCl (GLUCOSAMINE PO) Take 1 tablet by mouth daily.   Yes [provider]  lactulose (CHRONULAC) 10 GM/15ML solution  Take 30 mLs (20 g total) by mouth 3 (three) times daily. 11/22/21  Yes Manuella Ghazi, Pratik D, DO  levothyroxine (SYNTHROID) 100 MCG tablet Take 100 mcg by mouth daily. 02/09/21  Yes [provider]  metoprolol succinate (TOPROL-XL) 100 MG 24 hr tablet Take 1 tablet (100 mg total) by mouth daily. Take with or immediately following a meal. 11/23/21 12/23/21 Yes Shah, Pratik D, DO  pravastatin (PRAVACHOL) 40 MG tablet Take 40 mg by mouth daily.   Yes [provider]  rifaximin (XIFAXAN) 550 MG TABS tablet Take 1 tablet (550 mg total) by mouth 2 (two) times daily. 11/22/21 12/22/21 Yes Shah, Pratik D, DO  vitamin C (ASCORBIC ACID) 500 MG tablet Take 500 mg by mouth daily.   Yes [provider]  Accu-Chek FastClix Lancets MISC Apply topically. 06/01/21   [provider]  ACCU-CHEK GUIDE test strip  05/27/21   [provider]  Cholecalciferol (VITAMIN D3 PO) Take 1 Dose by mouth daily. Patient not taking: Reported on 11/17/2021    [provider]  pantoprazole (PROTONIX) 40 MG tablet Take 1 tablet (40 mg total) by mouth in the morning. Patient not taking: Reported on 11/17/2021 06/06/21 08/05/21  Noralyn Pick, NP    Current Facility-Administered Medications  Medication Dose Route Frequency Provider Last Rate Last Admin   insulin aspart (novoLOG) injection 0-5 Units  0-5 Units Subcutaneous QHS Kristopher Oppenheim, DO       insulin aspart (novoLOG) injection 0-9 Units  0-9 Units Subcutaneous TID WC Kristopher Oppenheim, DO       lactulose (CHRONULAC) 10 GM/15ML solution 20 g  20 g Oral TID Kristopher Oppenheim, DO       levothyroxine (SYNTHROID) tablet 100 mcg  100 mcg Oral QHS Kristopher Oppenheim, DO       rifaximin Doreene Nest) tablet 550 mg  550 mg Oral BID Kristopher Oppenheim, DO       Current Outpatient Medications  Medication Sig Dispense Refill   allopurinol (ZYLOPRIM) 300 MG tablet Take 150 mg by mouth daily.     busPIRone (BUSPAR) 10 MG tablet Take 10 mg by mouth daily.     citalopram (CELEXA)  40 MG tablet Take 40 mg by mouth daily.     ferrous sulfate 325 (65 FE) MG tablet Take 1 tablet (325 mg total) by mouth daily with breakfast. 30 tablet 1   Glucosamine HCl (GLUCOSAMINE PO) Take 1 tablet by mouth daily.     lactulose (CHRONULAC) 10 GM/15ML solution Take 30 mLs (20 g total) by mouth 3 (three) times daily. 236 mL 2   levothyroxine (SYNTHROID) 100 MCG tablet Take 100 mcg by mouth daily.     metoprolol succinate (TOPROL-XL) 100 MG 24 hr tablet Take 1 tablet (100 mg total) by mouth daily. Take with or immediately following a meal. 30 tablet 1   pravastatin (PRAVACHOL) 40 MG tablet Take 40 mg by mouth daily.     rifaximin (XIFAXAN) 550 MG TABS tablet Take 1 tablet (550 mg total) by mouth 2 (two) times daily. 60 tablet 2   vitamin C (  ASCORBIC ACID) 500 MG tablet Take 500 mg by mouth daily.     Accu-Chek FastClix Lancets MISC Apply topically.     ACCU-CHEK GUIDE test strip      Cholecalciferol (VITAMIN D3 PO) Take 1 Dose by mouth daily. (Patient not taking: Reported on 11/17/2021)     pantoprazole (PROTONIX) 40 MG tablet Take 1 tablet (40 mg total) by mouth in the morning. (Patient not taking: Reported on 11/17/2021) 90 tablet 0    Allergies as of 11/24/2021   (No Known Allergies)    Family History  Problem Relation Age of Onset   Heart attack Other    Lymphoma Other    COPD Other    Thyroid disease Other    Sleep apnea Other     Social History   Socioeconomic History   Marital status: Widowed    Spouse name: Not on file   Number of children: 1   Years of education: Not on file   Highest education level: Not on file  Occupational History   Occupation: retired  Tobacco Use   Smoking status: Never   Smokeless tobacco: Never  Substance and Sexual Activity   Alcohol use: No   Drug use: Never   Sexual activity: Not on file  Other Topics Concern   Not on file  Social History Narrative   Not on file   Social Determinants of Health   Financial Resource Strain: Not on  file  Food Insecurity: Not on file  Transportation Needs: Not on file  Physical Activity: Not on file  Stress: Not on file  Social Connections: Not on file  Intimate Partner Violence: Not on file   Review of Systems: ROS is O/W negative except as mentioned in HPI.  Physical Exam: Vital signs in last 24 hours: Temp:  [97 F (36.1 C)-97.9 F (36.6 C)] 97 F (36.1 C) (02/17 0102) Pulse Rate:  [51-67] 67 (02/17 0830) Resp:  [11-17] 17 (02/17 0830) BP: (93-121)/(43-74) 113/55 (02/17 0830) SpO2:  [97 %-100 %] 100 % (02/17 0830) Weight:  [105 kg] 105 kg (02/16 2304)   General:  Alert, chronically ill-appearing, pleasant and cooperative in NAD Head:  Normocephalic and atraumatic. Eyes:  Sclera clear, no icterus.  Conjunctiva pink. Ears:  Normal auditory acuity. Mouth:  No deformity or lesions.   Lungs:  Clear throughout to auscultation.  No wheezes, crackles, or rhonchi.  Heart:  Regular rate and rhythm; no murmurs, clicks, rubs, or gallops. Abdomen:  Largely distended with ascites fluid.  BS present.  Non-tender. Msk:  Symmetrical without gross deformities.  Pulses:  Normal pulses noted. Extremities:  Without clubbing or edema. Neurologic:  Alert and oriented x 4; asterixis is present. Skin:  Intact without significant lesions or rashes. Psych:  Alert and cooperative. Normal mood and affect.  Lab Results: Recent Labs    11/25/21 0030  WBC 9.9  HGB 10.4*  HCT 31.0*  PLT 172   BMET Recent Labs    11/25/21 0030  NA 132*  K 5.0  CL 103  CO2 21*  GLUCOSE 119*  BUN 40*  CREATININE 1.96*  CALCIUM 8.9   LFT Recent Labs    11/25/21 0030  PROT 6.2*  ALBUMIN 2.1*  AST 128*  ALT 56*  ALKPHOS 108  BILITOT 2.7*   Studies/Results: CT Head Wo Contrast  Result Date: 11/25/2021 CLINICAL DATA:  Mental status change, unknown cause EXAM: CT HEAD WITHOUT CONTRAST TECHNIQUE: Contiguous axial images were obtained from the base of the skull through  the vertex without  intravenous contrast. RADIATION DOSE REDUCTION: This exam was performed according to the departmental dose-optimization program which includes automated exposure control, adjustment of the mA and/or kV according to patient size and/or use of iterative reconstruction technique. COMPARISON:  CT head 10/17/2021 BRAIN: BRAIN Patchy and confluent areas of decreased attenuation are noted throughout the deep and periventricular white matter of the cerebral hemispheres bilaterally, compatible with chronic microvascular ischemic disease. No evidence of large-territorial acute infarction. No parenchymal hemorrhage. No mass lesion. No extra-axial collection. No mass effect or midline shift. No hydrocephalus. Basilar cisterns are patent. Vascular: No hyperdense vessel. Atherosclerotic calcifications are present within the cavernous internal carotid arteries. Skull: No acute fracture or focal lesion. Sinuses/Orbits: Paranasal sinuses and mastoid air cells are clear. The orbits are unremarkable. Other: None. IMPRESSION: No acute intracranial abnormality. Electronically Signed   By: Iven Finn M.D.   On: 11/25/2021 01:50   DG Chest Port 1 View  Result Date: 11/25/2021 CLINICAL DATA:  Shortness of breath EXAM: PORTABLE CHEST 1 VIEW COMPARISON:  11/17/2021 FINDINGS: The heart size and mediastinal contours are within normal limits. Both lungs are clear. The visualized skeletal structures are unremarkable. IMPRESSION: No active disease. Electronically Signed   By: Ulyses Jarred M.D.   On: 11/25/2021 01:58    IMPRESSION:  *Decompensated Nash cirrhosis: Now with ascites requiring frequent paracentesis recently (weekly x 2 weeks).  No SBP.  Unable to take diuretics recently due to rising creatinine.  Not a TIPS candidate due to HE. MELD-Na score: 25 at 11/20/2021  4:50 AM MELD score: 23 at 11/20/2021  4:50 AM Calculated from: Serum Creatinine: 2.11 mg/dL at 11/20/2021  4:50 AM Serum Sodium: 133 mmol/L at 11/20/2021  4:50  AM Total Bilirubin: 3.3 mg/dL at 11/18/2021  4:55 AM INR(ratio): 1.5 at 11/18/2021  4:55 AM Age: 47 years  *Hepatic encephalopathy on Xifaxan 550 mg twice daily and lactulose.  Says that she is compliant with those.  Ammonia level 113 on admission.  Seems to be mentating just fine, but does have asterixis on exam. *Hyponatremia: Sodium 132 today. *Elevated TSH here: Is on Synthroid.  Likely will need that dose adjusted.  PLAN: -Agree with palliative care consult to discuss with patient and family and set [place]. -Will get Dr. Blanch Media recommendations as well. -2 grams sodium diet should be followed. -Will get an updated PT/INR.   Laban Emperor. Zehr  11/25/2021, 9:31 AM  GI ATTENDING  History, laboratories, x-rays, prior endoscopic evaluations reviewed.  Patient seen and examined.  Agree with comprehensive consultation note as outlined above.  The patient has advanced liver disease secondary to NASH cirrhosis.  MELD score of 25.  Increasing problems with ascites with associated renal insufficiency.  Hepatic encephalopathy limits more aggressive treatment options such as TIPS.  Unfortunately, her prognosis is poor.  At this point, care is supportive and palliative for the most part. RECOMMEND: 1.  Xifaxan 550 mg twice daily for encephalopathy 2.  Lactulose (for encephalopathy) daily to achieve 3-5 bowel movements 3.  Limited paracentesis for relief as well as to rule out SBP.  IV albumin replacement 4.  2 g sodium diet 5.  Agree with palliative care consult 6.  Outpatient GI management of liver disease per Dr. Soyla Murphy. Geri Seminole., M.D. [facility] Division of Gastroenterology

## 2021-11-25 NOTE — ED Provider Notes (Signed)
[facility] DEPT Provider Note   CSN: [id number] Arrival date & time: 11/24/21  2257     History  Chief Complaint  Patient presents with   Fatigue    Ann Mcconnell is a 76 y.o. female.  The history is provided by the patient, a relative and medical records.  Ann Mcconnell is a 76 y.o. female who presents to the Emergency Department complaining of weakness.  She presents to the emergency department accompanied by EMS from home.  History is provided by EMS, the patient and her brother.  She has a history of cirrhosis and was discharged from the hospital 2 days ago.  While in the hospital she did have ascites drained.  When she went home she lives with her daughter-in-law and has home health and PT available.  She did fairly well with physical therapy today.  This evening her daughter-in-law attempted to get her out of the bed to use the bathroom.  She did have difficulty with this task but was able to get to the commode.  When she was on the commode she passed out with her head leaned against the vanity.  She was only briefly unconscious for a few seconds.  Family report that she is more confused with significant weakness, unable to get up from the commode.  Since leaving the hospital she has not had a bowel movement.  She is compliant with medications.  Patient complains of generalized weakness, fatigue, confusion.  She does report occasional shortness of breath.  No complaints of pain.      Home Medications Prior to Admission medications   Medication Sig Start Date End Date Taking? Authorizing Provider  allopurinol (ZYLOPRIM) 300 MG tablet Take 150 mg by mouth daily.   Yes [provider]  busPIRone (BUSPAR) 10 MG tablet Take 10 mg by mouth daily.   Yes [provider]  citalopram (CELEXA) 40 MG tablet Take 40 mg by mouth daily. 03/31/21  Yes [provider]  ferrous sulfate 325 (65 FE) MG tablet Take 1 tablet (325 mg total)  by mouth daily with breakfast. 07/28/21  Yes Armbruster, Carlota Raspberry, MD  Glucosamine HCl (GLUCOSAMINE PO) Take 1 tablet by mouth daily.   Yes [provider]  lactulose (CHRONULAC) 10 GM/15ML solution Take 30 mLs (20 g total) by mouth 3 (three) times daily. 11/22/21  Yes Manuella Ghazi, Pratik D, DO  levothyroxine (SYNTHROID) 100 MCG tablet Take 100 mcg by mouth daily. 02/09/21  Yes [provider]  metoprolol succinate (TOPROL-XL) 100 MG 24 hr tablet Take 1 tablet (100 mg total) by mouth daily. Take with or immediately following a meal. 11/23/21 12/23/21 Yes Shah, Pratik D, DO  pravastatin (PRAVACHOL) 40 MG tablet Take 40 mg by mouth daily.   Yes [provider]  rifaximin (XIFAXAN) 550 MG TABS tablet Take 1 tablet (550 mg total) by mouth 2 (two) times daily. 11/22/21 12/22/21 Yes Shah, Pratik D, DO  vitamin C (ASCORBIC ACID) 500 MG tablet Take 500 mg by mouth daily.   Yes [provider]  Accu-Chek FastClix Lancets MISC Apply topically. 06/01/21   [provider]  ACCU-CHEK GUIDE test strip  05/27/21   [provider]  Cholecalciferol (VITAMIN D3 PO) Take 1 Dose by mouth daily. Patient not taking: Reported on 11/17/2021    [provider]  pantoprazole (PROTONIX) 40 MG tablet Take 1 tablet (40 mg total) by mouth in the morning. Patient not taking: Reported on 11/17/2021 06/06/21 08/05/21  Noralyn Pick, NP      Allergies    Patient has no known allergies.    Review of Systems   Review of Systems  All other systems reviewed and are negative.  Physical Exam Updated Vital Signs BP (!) 100/57    Pulse (!) 59    Temp (!) 97 F (36.1 C) (Rectal)    Resp 12    Ht 5\' 6"  (1.676 m)    Wt 105 kg    SpO2 98%    BMI 37.36 kg/m  Physical Exam Vitals and nursing note reviewed.  Constitutional:      Appearance: She is well-developed. She is ill-appearing.  HENT:     Head: Normocephalic and atraumatic.  Cardiovascular:     Rate and Rhythm: Regular  rhythm. Bradycardia present.     Heart sounds: No murmur heard. Pulmonary:     Effort: Pulmonary effort is normal. No respiratory distress.     Breath sounds: Normal breath sounds.  Abdominal:     Palpations: Abdomen is soft.     Tenderness: There is no guarding or rebound.     Comments: Mild generalized abdominal tenderness  Musculoskeletal:        General: No tenderness.     Comments: Pitting edema to bilateral lower extremities.  Trace pitting edema to bilateral upper extremities  Skin:    General: Skin is warm and dry.     Coloration: Skin is pale.  Neurological:     Mental Status: She is alert.     Comments: Oriented to person, hospital.  Disoriented to year, recent events.  Profound generalized weakness.  Psychiatric:        Behavior: Behavior normal.    ED Results / Procedures / Treatments   Labs (all labs ordered are listed, but only abnormal results are displayed) Labs Reviewed  COMPREHENSIVE METABOLIC PANEL - Abnormal; Notable for the following components:      Result Value   Sodium 132 (*)    CO2 21 (*)    Glucose, Bld 119 (*)    BUN 40 (*)    Creatinine, Ser 1.96 (*)    Total Protein 6.2 (*)    Albumin 2.1 (*)    AST 128 (*)    ALT 56 (*)    Total Bilirubin 2.7 (*)    GFR, Estimated 26 (*)    All other components within normal limits  CBC WITH DIFFERENTIAL/PLATELET - Abnormal; Notable for the following components:   RBC 2.97 (*)    Hemoglobin 10.4 (*)    HCT 31.0 (*)    MCV 104.4 (*)    MCH 35.0 (*)    RDW 18.8 (*)    Monocytes Absolute 1.1 (*)    All other components within normal limits  AMMONIA - Abnormal; Notable for the following components:   Ammonia 114 (*)    All other components within normal limits  TSH - Abnormal; Notable for the following components:   TSH 19.370 (*)    All other components within normal limits  HEMOGLOBIN A1C - Abnormal; Notable for the following components:   Hgb A1c MFr Bld 4.4 (*)    All other components within  normal limits  RESP PANEL BY RT-PCR (FLU A&B, COVID) [id number]  URINE CULTURE  URINALYSIS, ROUTINE W REFLEX MICROSCOPIC    EKG EKG Interpretation  Date/Time:  Friday November 25 2021 00:23:19 EST Ventricular Rate:  52 PR Interval:    QRS Duration: 114 QT Interval:  488 QTC Calculation:  459 R Axis:   38 Text Interpretation: Sinus bradycardia Borderline intraventricular conduction delay Low voltage, extremity and precordial leads Confirmed by Quintella Reichert [phone]) on 11/25/2021 12:25:30 AM  Radiology CT Head Wo Contrast  Result Date: 11/25/2021 CLINICAL DATA:  Mental status change, unknown cause EXAM: CT HEAD WITHOUT CONTRAST TECHNIQUE: Contiguous axial images were obtained from the base of the skull through the vertex without intravenous contrast. RADIATION DOSE REDUCTION: This exam was performed according to the departmental dose-optimization program which includes automated exposure control, adjustment of the mA and/or kV according to patient size and/or use of iterative reconstruction technique. COMPARISON:  CT head 10/17/2021 BRAIN: BRAIN Patchy and confluent areas of decreased attenuation are noted throughout the deep and periventricular white matter of the cerebral hemispheres bilaterally, compatible with chronic microvascular ischemic disease. No evidence of large-territorial acute infarction. No parenchymal hemorrhage. No mass lesion. No extra-axial collection. No mass effect or midline shift. No hydrocephalus. Basilar cisterns are patent. Vascular: No hyperdense vessel. Atherosclerotic calcifications are present within the cavernous internal carotid arteries. Skull: No acute fracture or focal lesion. Sinuses/Orbits: Paranasal sinuses and mastoid air cells are clear. The orbits are unremarkable. Other: None. IMPRESSION: No acute intracranial abnormality. Electronically Signed   By: Iven Finn M.D.   On: 11/25/2021 01:50   DG Chest Port 1 View  Result Date: 11/25/2021 CLINICAL DATA:   Shortness of breath EXAM: PORTABLE CHEST 1 VIEW COMPARISON:  11/17/2021 FINDINGS: The heart size and mediastinal contours are within normal limits. Both lungs are clear. The visualized skeletal structures are unremarkable. IMPRESSION: No active disease. Electronically Signed   By: Ulyses Jarred M.D.   On: 11/25/2021 01:58    Procedures Procedures    Medications Ordered in ED Medications  rifaximin (XIFAXAN) tablet 550 mg (has no administration in time range)  levothyroxine (SYNTHROID) tablet 100 mcg (has no administration in time range)  lactulose (CHRONULAC) 10 GM/15ML solution 20 g (has no administration in time range)  insulin aspart (novoLOG) injection 0-9 Units (has no administration in time range)  insulin aspart (novoLOG) injection 0-5 Units (has no administration in time range)  lactulose (CHRONULAC) 10 GM/15ML solution 30 g (30 g Oral Given 11/25/21 0239)    ED Course/ Medical Decision Making/ A&P                           Medical Decision Making Amount and/or Complexity of Data Reviewed Labs: ordered. Radiology: ordered.  Risk Prescription drug management. Decision regarding hospitalization.   Patient with history of [name], CKD here for evaluation of weakness, syncopal event when using the commode.  She is ill-appearing on evaluation with generalized weakness, confusion, edema.  Her ammonia remains elevated, concern for persistent hepatic encephalopathy and lack of bowel movements since she has arrived at home since her hospital stay.  Labs are near her baseline since recent hospitalization.  There is no evidence of acute infectious process.  EKG with sinus bradycardia, low voltage.  Similar when compared to priors.  Medicine consulted for admission for ongoing management of hepatic encephalopathy.        Final Clinical Impression(s) / ED Diagnoses Final diagnoses:  None    Rx / DC Orders ED Discharge Orders     None         Quintella Reichert, MD 11/25/21  [phone]

## 2021-11-25 NOTE — Assessment & Plan Note (Signed)
Patient has generalized weakness.  Unable to stand toilet.  Seen by PT.  Family confirms that home health came out to the house today for initial assessment.  Discussed with the patient's brother that the patient enrolled into hospice, she would no longer qualify for physical therapy.

## 2021-11-25 NOTE — ED Notes (Signed)
Lunch tray given. 

## 2021-11-25 NOTE — Assessment & Plan Note (Signed)
Admit to medical bed.  Continue Xifaxan and lactulose.  Patient appears near end-stage liver disease.  I do not think she is a TIPS candidate as she has continued to have significant encephalopathy.  Discussed with the brother that after TIPS procedure, patient's family have worsening of their encephalopathy.

## 2021-11-25 NOTE — Telephone Encounter (Signed)
The pt's daughter in law has been advised that she will need to speak with the nurses and doctors in the hospital for advise.  GI will be consulted if needed.

## 2021-11-25 NOTE — Telephone Encounter (Signed)
Spoke with pts daugher in law and let he know pt has been seen by Alonza Bogus PA already this am. She is glad that GI has been called in and consulted.

## 2021-11-25 NOTE — Assessment & Plan Note (Signed)
Placed patient on sliding scale insulin only.  Patient's appetite's been poor.

## 2021-11-25 NOTE — Plan of Care (Signed)
°  Problem: Education: °Goal: Knowledge of General Education information will improve °Description: Including pain rating scale, medication(s)/side effects and non-pharmacologic comfort measures °Outcome: Progressing °  °Problem: Clinical Measurements: °Goal: Ability to maintain clinical measurements within normal limits will improve °Outcome: Progressing °  °Problem: Nutrition: °Goal: Adequate nutrition will be maintained °Outcome: Progressing °  °

## 2021-11-25 NOTE — Assessment & Plan Note (Signed)
TSH is quite elevated.  Reportedly patient is taking her Synthroid.  May need to change the dosing schedule to nightly so she is taking this on empty stomach.

## 2021-11-25 NOTE — Telephone Encounter (Signed)
Patients daughter in law called to advise the patient has been admitted to the hospital [facility] so they are seeking advise.

## 2021-11-25 NOTE — Subjective & Objective (Signed)
CC: weakness, confusion HPI: 76 yo WF with hx of Ann Mcconnell cirrhosis with ascites, Hepatic encephalopathy, type 2 diabetes, morbid obesity, hypothyroidism presents to the ER today with weakness and syncope after having bowel movement at home.  Patient recently discharged from the hospital on 11/22/2021.  Patient underwent paracentesis for 4.4 L.  Patient unable to give any history review of systems due to confusion.  Patient is here in the ER with her brother Ann Mcconnell.  He states the patient was confused today.  She was unable to go to the bathroom and had to sit on the bedside commode.  Patient passed out after having a bowel movement.  She was unable to get off the bedside commode.  She was brought to the ER.  Patient's condition has been steadily declining over the last several months.  Patient is confused all the time now.  Daughter states that patient is taking her lactulose and her Xifaxan.  Patient lives with her daughter.  Patient's daughter states that the son Ann Mcconnell that her power of attorney.  Brother would like a GI consult to go over prognosis with the patient and her family.  He understands that the patient is near end-stage and that hospice would be most appropriate for her.  However he is not the decision maker for her and he wants family especially the patient's children but to hear from the GI doctor.  He does request a palliative care consult.  [facility] hospitalist contacted for admission.

## 2021-11-26 ENCOUNTER — Inpatient Hospital Stay (HOSPITAL_COMMUNITY): Payer: Medicare Other

## 2021-11-26 DIAGNOSIS — Z515 Encounter for palliative care: Secondary | ICD-10-CM

## 2021-11-26 DIAGNOSIS — Z7189 Other specified counseling: Secondary | ICD-10-CM

## 2021-11-26 LAB — CBC WITH DIFFERENTIAL/PLATELET
Abs Immature Granulocytes: 0.05 10*3/uL (ref 0.00–0.07)
Basophils Absolute: 0.1 10*3/uL (ref 0.0–0.1)
Basophils Relative: 1 %
Eosinophils Absolute: 0.5 10*3/uL (ref 0.0–0.5)
Eosinophils Relative: 6 %
HCT: 28.6 % — ABNORMAL LOW (ref 36.0–46.0)
Hemoglobin: 9.6 g/dL — ABNORMAL LOW (ref 12.0–15.0)
Immature Granulocytes: 1 %
Lymphocytes Relative: 30 %
Lymphs Abs: 2.8 10*3/uL (ref 0.7–4.0)
MCH: 34.9 pg — ABNORMAL HIGH (ref 26.0–34.0)
MCHC: 33.6 g/dL (ref 30.0–36.0)
MCV: 104 fL — ABNORMAL HIGH (ref 80.0–100.0)
Monocytes Absolute: 0.9 10*3/uL (ref 0.1–1.0)
Monocytes Relative: 9 %
Neutro Abs: 5.1 10*3/uL (ref 1.7–7.7)
Neutrophils Relative %: 53 %
Platelets: 157 10*3/uL (ref 150–400)
RBC: 2.75 MIL/uL — ABNORMAL LOW (ref 3.87–5.11)
RDW: 18.9 % — ABNORMAL HIGH (ref 11.5–15.5)
WBC: 9.5 10*3/uL (ref 4.0–10.5)
nRBC: 0 % (ref 0.0–0.2)

## 2021-11-26 LAB — COMPREHENSIVE METABOLIC PANEL
ALT: 66 U/L — ABNORMAL HIGH (ref 0–44)
AST: 145 U/L — ABNORMAL HIGH (ref 15–41)
Albumin: 2 g/dL — ABNORMAL LOW (ref 3.5–5.0)
Alkaline Phosphatase: 111 U/L (ref 38–126)
Anion gap: 7 (ref 5–15)
BUN: 41 mg/dL — ABNORMAL HIGH (ref 8–23)
CO2: 24 mmol/L (ref 22–32)
Calcium: 9.2 mg/dL (ref 8.9–10.3)
Chloride: 103 mmol/L (ref 98–111)
Creatinine, Ser: 2.07 mg/dL — ABNORMAL HIGH (ref 0.44–1.00)
GFR, Estimated: 25 mL/min — ABNORMAL LOW (ref >60)
Glucose, Bld: 107 mg/dL — ABNORMAL HIGH (ref 70–99)
Potassium: 4.4 mmol/L (ref 3.5–5.1)
Sodium: 134 mmol/L — ABNORMAL LOW (ref 135–145)
Total Bilirubin: 2.7 mg/dL — ABNORMAL HIGH (ref 0.3–1.2)
Total Protein: 6.2 g/dL — ABNORMAL LOW (ref 6.5–8.1)

## 2021-11-26 LAB — GLUCOSE, CAPILLARY
Glucose-Capillary: 100 mg/dL — ABNORMAL HIGH (ref 70–99)
Glucose-Capillary: 126 mg/dL — ABNORMAL HIGH (ref 70–99)
Glucose-Capillary: 143 mg/dL — ABNORMAL HIGH (ref 70–99)

## 2021-11-26 LAB — GRAM STAIN

## 2021-11-26 LAB — BODY FLUID CELL COUNT WITH DIFFERENTIAL
Lymphs, Fluid: 46 %
Monocyte-Macrophage-Serous Fluid: 36 % — ABNORMAL LOW (ref 50–90)
Neutrophil Count, Fluid: 18 % (ref 0–25)
Total Nucleated Cell Count, Fluid: 191 cu mm (ref 0–1000)

## 2021-11-26 LAB — PROTIME-INR
INR: 1.6 — ABNORMAL HIGH (ref 0.8–1.2)
Prothrombin Time: 19.1 seconds — ABNORMAL HIGH (ref 11.4–15.2)

## 2021-11-26 MED ORDER — CITALOPRAM HYDROBROMIDE 40 MG PO TABS: 20.0000 mg | ORAL_TABLET | Freq: Every day | ORAL | Status: AC

## 2021-11-26 MED ORDER — LIDOCAINE HCL 1 % IJ SOLN
INTRAMUSCULAR | Status: AC
  Administered 2021-11-26: 10 mL
  Filled 2021-11-26: qty 20

## 2021-11-26 NOTE — Progress Notes (Addendum)
[facility]) Hospital Liaison Note   Received request from Transitions of Care Manager, Jinny Blossom, for hospice services at home after discharge. Chart and patient information under review by [facility] physician. Hospice eligibility approved.   Spoke with Helene Kelp to initiate education related to hospice philosophy, services, and team approach to care. Helene Kelp verbalized understanding of information given. Per discussion, the plan is for patient to discharge home via ptar  once cleared to DC.    DME needs discussed. Patient has the following equipment in the home: [facility] (purchased privately) Wheelchair (privately purchased) Radiation protection practitioner (privately purchased) Patient requests the following equipment for delivery: Hospital bed   Address verified and is correct in the chart. Helene Kelp is the family member to contact to arrange time of equipment delivery.    [facility] information and contact numbers given to family & above information shared with TOC.  Addendum: 3:54p MSW contacted DIL[facility]Te[facility] and confirmed DME delivery. TOC/MD/RN all notifed. MSW notified TOC that transport can be arranged.  Please provide prescriptions at discharge as needed to ensure ongoing symptom management and a transport packet.  Please call with any questions/concerns.    Thank you for the opportunity to participate in this patient's care.   Daphene Calamity, MSW [facility] Liaison  [phone]

## 2021-11-26 NOTE — Plan of Care (Signed)
Patient discharged, all care plans met ° °

## 2021-11-26 NOTE — Consult Note (Addendum)
HISTORY OF PRESENT ILLNESS:  Ann Mcconnell is a 76 y.o. 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  She came into the hospital with altered mental status.  She improved for the most part on lactulose and Xifaxan.  Earlier today she underwent an uneventful paracentesis of 3.5 L.  Her abdomen feels better.  She has been evaluated by palliative care.  The plan is for discharge home with hospital bed and palliative care services.  The patient's sister is in the room.  She has a number of questions that she would like to ask me in private.  Patient herself has no specific complaints today.  LABORATORIES: Liver tests 145/66/111/2 0.7 Hemoglobin 9.6, platelets 157,000, BUN 41, creatinine 2.07, INR 1.6  RADIOLOGY intervention: Paracentesis today.  3.5 L.  No evidence for SBP (191 white blood cell, 18% neutrophils).  Did receive IV albumin replacement.  REVIEW OF SYSTEMS:  All non-GI ROS negative except for depression  Past Medical History:  Diagnosis Date   Acute upper GI bleed 05/02/2021   Cirrhosis ([place])    Diabetes mellitus without complication (HCC)    DOE (dyspnea on exertion)    Gastrointestinal hemorrhage 04/30/2021   GAVE (gastric antral vascular ectasia)    Gout    HLD (hyperlipidemia)    HTN (hypertension)    Hypothyroidism    Tachycardia     Past Surgical History:  Procedure Laterality Date   Arm Surgery     BREAST REDUCTION SURGERY     CHOLECYSTECTOMY     COLONOSCOPY WITH PROPOFOL N/A 05/02/2021   Procedure: COLONOSCOPY WITH PROPOFOL;  Surgeon: Mauri Pole, MD;  Location: [facility];  Service: Endoscopy;  Laterality: N/A;   ESOPHAGOGASTRODUODENOSCOPY (EGD) WITH PROPOFOL N/A 05/02/2021   Procedure: ESOPHAGOGASTRODUODENOSCOPY (EGD) WITH PROPOFOL;  Surgeon: Mauri Pole, MD;  Location: [facility];  Service: Endoscopy;  Laterality: N/A;   HOT HEMOSTASIS N/A 05/02/2021    Procedure: HOT HEMOSTASIS (ARGON PLASMA COAGULATION/BICAP);  Surgeon: Mauri Pole, MD;  Location: [facility];  Service: Endoscopy;  Laterality: N/A;   IR PARACENTESIS  11/15/2021   REDUCTION MAMMAPLASTY      Social History Ann Mcconnell  reports that she has never smoked. She has never used smokeless tobacco. She reports that she does not drink alcohol and does not use drugs.  family history includes COPD in an other family member; Heart attack in an other family member; Lymphoma in an other family member; Sleep apnea in an other family member; Thyroid disease in an other family member.  No Known Allergies     PHYSICAL EXAMINATION: Vital signs: BP (!) 115/53 (BP Location: Right Arm)    Pulse (!) 58    Temp 98.1 F (36.7 C) (Oral)    Resp 15    Ht 5\' 6"  (1.676 m)    Wt 106.8 kg    SpO2 100%    BMI 38.00 kg/m   Constitutional: Chronically ill-appearing, no acute distress Psychiatric: alert and oriented x3, cooperative Eyes: extraocular movements intact, anicteric, conjunctiva pink Mouth: oral pharynx moist, no lesions Neck: supple no lymphadenopathy Cardiovascular: heart regular rate and rhythm, no murmur Lungs: clear to auscultation bilaterally Abdomen: soft, nontender, nondistended, probable residual ascites, no peritoneal signs, normal bowel sounds, no organomegaly Rectal: Omitted Extremities: no clubbing or cyanosis.  1+ lower extremity edema bilaterally Skin: Ashen.  Multiple hematomas from phlebotomy Neuro: No focal deficits.  Positive asterixis.     ASSESSMENT:  1.  NASH cirrhosis portal hypertension.  MELD score 25.  98-month mortality 20% 2.  Recurrent ascites.  Management made difficult by renal insufficiency 3.  Renal insufficiency 4.  Hepatic encephalopathy 5.  Status post palliative care evaluation.  Hospice recommended  PLAN:  1.  Very limited regarding management of ascites with renal insufficiency and hepatic encephalopathy.  At this point, on  demand paracentesis recommended. 2.  Continue Xifaxan and lactulose 3.  Plans for outpatient hospice noted 4.  Outpatient assessment with [facility] as needed I spent 15 minutes (out of a total of 50 minutes for the entire encounter as outlined) with the patient's sister answering multiple questions to her satisfaction. No further recommendations from GI.  We will sign off.  [name]., M.D. [facility] Division of Gastroenterology

## 2021-11-26 NOTE — Procedures (Signed)
PROCEDURE SUMMARY:  Successful US guided diagnostic and therapeutic paracentesis from right lower quadrant.  Yielded 3.5 L of clear, straw-colored fluid.  No immediate complications.  Pt tolerated well.   Specimen was sent for labs.  EBL < 1 mL  Tyson Alias, AGNP 11/26/2021 10:19 AM

## 2021-11-26 NOTE — TOC Initial Note (Addendum)
Transition of Care [facility]) - Initial/Assessment Note   Patient Details  Name: LOREDA SILVERIO MRN: [id number] Date of Birth: April 17, 1946  Transition of Care [facility]) CM/SW Contact:    Sherie Don, LCSW Phone Number: 11/26/2021, 10:40 AM  Clinical Narrative: [facility] consulted for hospice referral. CSW spoke with patient's daughter-in-law, Helene Kelp, to discuss home hospice options. Helene Kelp agreeable to referral to [place]. CSW made referral to [facility] with [facility]. [facility] to update TOC if hospital bed can be delivered to the home today as Helene Kelp reported the patient would not be able to discharge home without it.  Addendum: DME has been set up, so PTAR has been scheduled. Discharge paperwork provided to RN. CSW updated patient's granddaughter, Lovena Le, about transport. TOC signing off.  Expected Discharge Plan: Home w Hospice Care Barriers to Discharge: Other (must enter comment) [facility] bed will need to be delivered prior to discharge.)  Patient Goals and CMS Choice Patient states their goals for this hospitalization and ongoing recovery are:: Go home with hospice CMS Medicare.gov Compare Post Acute Care list provided to:: Patient Represenative (must comment) Helene Kelp (daughter-in-law))  Expected Discharge Plan and Services Expected Discharge Plan: Home w Hospice Care In-house Referral: Clinical Social Work Post Acute Care Choice: Hospice Living arrangements for the past 2 months: Artist  Prior Living Arrangements/Services Living arrangements for the past 2 months: Artist Lives with:: Adult Children Patient language and need for interpreter reviewed:: Yes Do you feel safe going back to the place where you live?: Yes      Need for Family Participation in Patient Care: Yes (Comment) Care giver support system in place?: Yes (comment) Current home services: DME Administrator, Civil Service) Criminal Activity/Legal Involvement Pertinent to Current  Situation/Hospitalization: No - Comment as needed  Activities of Daily Living Home Assistive Devices/Equipment: Wheelchair ADL Screening (condition at time of admission) Patient's cognitive ability adequate to safely complete daily activities?: Yes Is the patient deaf or have difficulty hearing?: No Does the patient have difficulty seeing, even when wearing glasses/contacts?: No Does the patient have difficulty concentrating, remembering, or making decisions?: No Patient able to express need for assistance with ADLs?: Yes Does the patient have difficulty dressing or bathing?: No Independently performs ADLs?: No Communication: Independent Does the patient have difficulty walking or climbing stairs?: Yes Weakness of Legs: Both Weakness of Arms/Hands: Both  Permission Sought/Granted Permission sought to share information with : Other (comment) Permission granted to share information with : Yes, Verbal Permission Granted Permission granted to share info w AGENCY: [facility]  Emotional Assessment Orientation: : Oriented to Self, Oriented to Place, Oriented to  Time, Oriented to Situation Alcohol / Substance Use: Not Applicable Psych Involvement: No (comment)  Admission diagnosis:  Hepatic encephalopathy [K76.82] Ascites [R18.8] Patient Active Problem List   Diagnosis Date Noted   Hepatic encephalopathy 11/25/2021   Chronic kidney disease, stage 3a ([place]) 11/25/2021   Fatty (change of) liver, not elsewhere classified 11/25/2021   Morbid obesity ([place]) 11/25/2021   Type 2 diabetes mellitus with chronic kidney disease, without long-term current use of insulin ([place]) 11/25/2021   Acute hepatic encephalopathy 11/19/2021   Acute hyponatremia 11/18/2021   Prolonged QT interval 11/18/2021   Hypothyroidism    Generalized weakness 11/17/2021   GAVE (gastric antral vascular ectasia)    Symptomatic anemia    Heme positive stool    Cirrhosis of liver with ascites (HCC)    Anemia associated  with acute blood loss 04/30/2021   Elevated bilirubin 04/30/2021   Hypothyroidism (acquired) 04/30/2021  Primary hypertension 04/30/2021   Gout 04/30/2021   PCP:  Maury Dus, MD Pharmacy:   [place], [place] - [address] [place] [place] [address] Phone: [phone] Fax: [phone]  Readmission Risk Interventions No flowsheet data found.

## 2021-11-26 NOTE — Discharge Summary (Signed)
Dorna Leitz Physician Discharge Summary   Patient: Ann Mcconnell MRN: [id number] DOB: 1946/07/14  Admit date:     11/24/2021  Discharge date: 11/26/21  Discharge Physician: Nita Sells   PCP: Maury Dus, MD   Recommendations at discharge:   rec continued discussions with goals of care as an outpatient lab by either Dr. Havery Moros or her PCP Dr. Reed-I will CC both physicians on this discharge summary As patient is a full code by going home with hospice and is deciding through this plan-we will continue on certain meds such as lactulose to help ensure adequate decision-making skills but goals of care have pretty much been solidified with patient and family members and they wish that she get comfort trajectory Rest as per hospice, thought care as well as physician  Discharge Diagnoses: Principal Problem:   Hepatic encephalopathy Active Problems:   Cirrhosis of liver with ascites ([place])   Generalized weakness   Hypothyroidism   Morbid obesity ([place])   Type 2 diabetes mellitus with chronic kidney disease, without long-term current use of insulin ([place])  Resolved Problems:   * No resolved hospital problems. *  76 year old white female with [name] cirrhosis with hospitalization recently 2/9 through 11/22/2021-at that time was recommended to follow-up in the outpatient setting with both GI and [place] [facility] because of hepatorenal issues as well as decompensated cirrhosis-most medication adjustments were appropriately made and patient had a paracentesis of 4.4 L with appropriate albumin administration Patient returned on 2/17 more confusion as well encephalopathy and had been declining at home over the past 2 days-family was unable to take Xifaxan because of cost but had been taking lactulose The patient's daughter Helene Kelp moved in with her and family requesting GI input into what seem like a progressive but terminal illness  Gastroenterology saw the patient, it was noted MELD  score was 25-renal insufficiency precluded TIPS procedure and encephalopathy precluded this as well Repeat paracentesis 3 L was performed on 2/18-palliative care and myself discussed in detail with the patient neck steps-her mentation improved during hospital stay-it was felt that treatment strategy should be focused on comfort measures and the patient and her power of attorney agreed plans were made for discharge home with hospice with hospital bed Note patient is a full code but has maintained strongly towards end-of-life care and DNR-she will make this decision in the setting of her home environment with her family members and loved ones Patient had met maximal hospital benefit from hospitalization and was discharged safely after delivery of DME to her home 11/26/2021        Consultants: Gastroenterology, palliative care Procedures performed: Paracentesis 3.3 L Disposition: Home Diet recommendation:  Discharge Diet Orders (From admission, onward)     Start     Ordered   11/26/21 0000  Diet - low sodium heart healthy        11/26/21 1206           Regular diet  DISCHARGE MEDICATION: Allergies as of 11/26/2021   No Known Allergies      Medication List     STOP taking these medications    Accu-Chek FastClix Lancets Misc   Accu-Chek Guide test strip Generic drug: glucose blood   allopurinol 300 MG tablet Commonly known as: ZYLOPRIM   ferrous sulfate 325 (65 FE) MG tablet   GLUCOSAMINE PO   metoprolol succinate 100 MG 24 hr tablet Commonly known as: TOPROL-XL   pravastatin 40 MG tablet Commonly known as: PRAVACHOL   rifaximin 550 MG  Tabs tablet Commonly known as: XIFAXAN   vitamin C 500 MG tablet Commonly known as: ASCORBIC ACID   VITAMIN D3 PO       TAKE these medications    busPIRone 10 MG tablet Commonly known as: BUSPAR Take 10 mg by mouth daily.   citalopram 40 MG tablet Commonly known as: CELEXA Take 0.5 tablets (20 mg total) by mouth  daily. What changed: how much to take   lactulose 10 GM/15ML solution Commonly known as: CHRONULAC Take 30 mLs (20 g total) by mouth 3 (three) times daily.   levothyroxine 100 MCG tablet Commonly known as: SYNTHROID Take 100 mcg by mouth daily.   pantoprazole 40 MG tablet Commonly known as: Protonix Take 1 tablet (40 mg total) by mouth in the morning.         Discharge Exam: Filed Weights   11/24/21 2304 11/26/21 0500  Weight: 105 kg 106.8 kg   Awake slightly sleepy but able to orient to time place person and is oriented to situation Able to answer simple questions about what she understands about her illness Thick neck Mallampati 4 Distended abdomen although less so after paracentesis ROM intact No lower extremity edema Cannot appreciate asterixis Chest clear no rales rhonchi   Condition at discharge: poor  The results of significant diagnostics from this hospitalization (including imaging, microbiology, ancillary and laboratory) are listed below for reference.   Imaging Studies: DG Chest 2 View  Result Date: 11/17/2021 CLINICAL DATA:  Generalized weakness over the last week EXAM: CHEST - 2 VIEW COMPARISON:  None. FINDINGS: There is left ventricular prominence. There is aortic atherosclerotic calcification. The lungs are clear. No infiltrate, collapse or effusion. No significant bone finding. IMPRESSION: No active cardiopulmonary disease. Left ventricular prominence. Aortic atherosclerotic calcification. Electronically Signed   By: Nelson Chimes M.D.   On: 11/17/2021 16:14   CT Head Wo Contrast  Result Date: 11/25/2021 CLINICAL DATA:  Mental status change, unknown cause EXAM: CT HEAD WITHOUT CONTRAST TECHNIQUE: Contiguous axial images were obtained from the base of the skull through the vertex without intravenous contrast. RADIATION DOSE REDUCTION: This exam was performed according to the departmental dose-optimization program which includes automated exposure control,  adjustment of the mA and/or kV according to patient size and/or use of iterative reconstruction technique. COMPARISON:  CT head 10/17/2021 BRAIN: BRAIN Patchy and confluent areas of decreased attenuation are noted throughout the deep and periventricular white matter of the cerebral hemispheres bilaterally, compatible with chronic microvascular ischemic disease. No evidence of large-territorial acute infarction. No parenchymal hemorrhage. No mass lesion. No extra-axial collection. No mass effect or midline shift. No hydrocephalus. Basilar cisterns are patent. Vascular: No hyperdense vessel. Atherosclerotic calcifications are present within the cavernous internal carotid arteries. Skull: No acute fracture or focal lesion. Sinuses/Orbits: Paranasal sinuses and mastoid air cells are clear. The orbits are unremarkable. Other: None. IMPRESSION: No acute intracranial abnormality. Electronically Signed   By: Iven Finn M.D.   On: 11/25/2021 01:50   US Paracentesis  Result Date: 11/26/2021 INDICATION: Patient with history of NASH cirrhosis, CKD, encephalopathy with recurrent ascites. Request received for diagnostic and therapeutic paracentesis. EXAM: ULTRASOUND GUIDED DIAGNOSTIC AND THERAPEUTIC RIGHT LOWER QUADRANT PARACENTESIS MEDICATIONS: 10 mL 1 % lidocaine COMPLICATIONS: None immediate. PROCEDURE: Informed written consent was obtained from the patient after a discussion of the risks, benefits and alternatives to treatment. A timeout was performed prior to the initiation of the procedure. Initial ultrasound scanning demonstrates a large amount of ascites within the right lower abdominal  quadrant. The right lower abdomen was prepped and draped in the usual sterile fashion. 1% lidocaine was used for local anesthesia. Following this, a 19 gauge, 10-cm, Yueh catheter was introduced. An ultrasound image was saved for documentation purposes. The paracentesis was performed. The catheter was removed and a dressing was  applied. The patient tolerated the procedure well without immediate post procedural complication. Patient received post-procedure intravenous albumin; see nursing notes for details. FINDINGS: A total of approximately 3.5 L of clear, straw-colored fluid was removed. Samples were sent to the laboratory as requested by the clinical team. IMPRESSION: Successful ultrasound-guided paracentesis yielding 3.5 liters of peritoneal fluid. Read by: Narda Rutherford, AGNP-BC Electronically Signed   By: Lucrezia Europe M.D.   On: 11/26/2021 10:40   US Paracentesis  Result Date: 11/21/2021 INDICATION: Patient with history of NASH cirrhosis, chronic kidney disease, encephalopathy, recurrent ascites. Request received for therapeutic paracentesis. EXAM: ULTRASOUND GUIDED THERAPEUTIC PARACENTESIS MEDICATIONS: 10 mL 1% lidocaine COMPLICATIONS: None immediate. PROCEDURE: Informed written consent was obtained from the patient/sister after a discussion of the risks, benefits and alternatives to treatment. A timeout was performed prior to the initiation of the procedure. Initial ultrasound scanning demonstrates a moderate to large amount of ascites within the right lower abdominal quadrant. The right lower abdomen was prepped and draped in the usual sterile fashion. 1% lidocaine was used for local anesthesia. Following this, a 19 gauge, 10-cm, Yueh catheter was introduced. An ultrasound image was saved for documentation purposes. The paracentesis was performed. The catheter was removed and a dressing was applied. The patient tolerated the procedure well without immediate post procedural complication. FINDINGS: A total of approximately 4.4 liters of yellow fluid was removed. IMPRESSION: Successful ultrasound-guided therapeutic paracentesis yielding 4.4 liters of peritoneal fluid. Read by: Rowe Robert, PA-C Electronically Signed   By: Ruthann Cancer M.D.   On: 11/21/2021 13:07   DG Chest Port 1 View  Result Date: 11/25/2021 CLINICAL DATA:   Shortness of breath EXAM: PORTABLE CHEST 1 VIEW COMPARISON:  11/17/2021 FINDINGS: The heart size and mediastinal contours are within normal limits. Both lungs are clear. The visualized skeletal structures are unremarkable. IMPRESSION: No active disease. Electronically Signed   By: Ulyses Jarred M.D.   On: 11/25/2021 01:58   IR Paracentesis  Result Date: 11/15/2021 INDICATION: Ascites, cirrhosis EXAM: ULTRASOUND GUIDED THERAPEUTIC PARACENTESIS MEDICATIONS: None. COMPLICATIONS: None immediate. PROCEDURE: Informed written consent was obtained from the patient after a discussion of the risks, benefits and alternatives to treatment. A timeout was performed prior to the initiation of the procedure. Initial ultrasound scanning demonstrates a large amount of ascites within the right lower abdominal quadrant. The right lower abdomen was prepped and draped in the usual sterile fashion. 1% lidocaine was used for local anesthesia. Following this, a 19 gauge, 7-cm, Yueh catheter was introduced. An ultrasound image was saved for documentation purposes. The paracentesis was performed. The catheter was removed and a dressing was applied. The patient tolerated the procedure well without immediate post procedural complication. FINDINGS: A total of approximately 4.4L of serous ascitic fluid was removed. IMPRESSION: Successful ultrasound-guided paracentesis yielding 4.4 liters of peritoneal fluid. Performed and Read by Pasty Spillers, PA-C Electronically Signed   By: Michaelle Birks M.D.   On: 11/15/2021 15:42    Microbiology: Results for orders placed or performed during the hospital encounter of 11/24/21  Resp Panel by RT-PCR (Flu A&B, Covid) Nasopharyngeal Swab     Status: None   Collection Time: 11/25/21  2:36 AM   Specimen: Nasopharyngeal  Swab; Nasopharyngeal(NP) swabs in vial transport medium  Result Value Ref Range Status   SARS Coronavirus 2 by RT PCR NEGATIVE NEGATIVE Final    Comment: (NOTE) SARS-CoV-2 target  nucleic acids are NOT DETECTED.  The SARS-CoV-2 RNA is generally detectable in upper respiratory specimens during the acute phase of infection. The lowest concentration of SARS-CoV-2 viral copies this assay can detect is 138 copies/mL. A negative result does not preclude SARS-Cov-2 infection and should not be used as the sole basis for treatment or other patient management decisions. A negative result may occur with  improper specimen collection/handling, submission of specimen other than nasopharyngeal swab, presence of viral mutation(s) within the areas targeted by this assay, and inadequate number of viral copies(<138 copies/mL). A negative result must be combined with clinical observations, patient history, and epidemiological information. The expected result is Negative.  Fact Sheet for Patients:  EntrepreneurPulse.com.au  Fact Sheet for Healthcare Providers:  IncredibleEmployment.be  This test is no t yet approved or cleared by the [place] FDA and  has been authorized for detection and/or diagnosis of SARS-CoV-2 by FDA under an Emergency Use Authorization (EUA). This EUA will remain  in effect (meaning this test can be used) for the duration of the COVID-19 declaration under Section 564(b)(1) of the Act, 21 U.S.C.section 360bbb-3(b)(1), unless the authorization is terminated  or revoked sooner.       Influenza A by PCR NEGATIVE NEGATIVE Final   Influenza B by PCR NEGATIVE NEGATIVE Final    Comment: (NOTE) The Xpert Xpress SARS-CoV-2/FLU/RSV plus assay is intended as an aid in the diagnosis of influenza from Nasopharyngeal swab specimens and should not be used as a sole basis for treatment. Nasal washings and aspirates are unacceptable for Xpert Xpress SARS-CoV-2/FLU/RSV testing.  Fact Sheet for Patients: EntrepreneurPulse.com.au  Fact Sheet for Healthcare  Providers: IncredibleEmployment.be  This test is not yet approved or cleared by the [place] FDA and has been authorized for detection and/or diagnosis of SARS-CoV-2 by FDA under an Emergency Use Authorization (EUA). This EUA will remain in effect (meaning this test can be used) for the duration of the COVID-19 declaration under Section 564(b)(1) of the Act, 21 U.S.C. section 360bbb-3(b)(1), unless the authorization is terminated or revoked.  Performed at [facility], [place] [address], [place], [place] [address]     Labs: CBC: Recent Labs  Lab 11/20/21 0450 11/21/21 0429 11/25/21 0030 11/26/21 0408  WBC 7.5 8.1 9.9 9.5  NEUTROABS 4.2 4.2 6.3 5.1  HGB 8.9* 9.1* 10.4* 9.6*  HCT 26.7* 26.6* 31.0* 28.6*  MCV 105.1* 102.7* 104.4* 104.0*  PLT 141* 147* 172 562   Basic Metabolic Panel: Recent Labs  Lab 11/20/21 0450 11/21/21 0429 11/25/21 0030 11/26/21 0408  NA 133* 133* 132* 134*  K 4.1 4.1 5.0 4.4  CL 104 104 103 103  CO2 24 21* 21* 24  GLUCOSE 92 86 119* 107*  BUN 39* 41* 40* 41*  CREATININE 2.11* 1.77* 1.96* 2.07*  CALCIUM 8.5* 8.6* 8.9 9.2  MG  --  2.1  --   --   PHOS  --  4.1  --   --    Liver Function Tests: Recent Labs  Lab 11/25/21 0030 11/26/21 0408  AST 128* 145*  ALT 56* 66*  ALKPHOS 108 111  BILITOT 2.7* 2.7*  PROT 6.2* 6.2*  ALBUMIN 2.1* 2.0*   CBG: Recent Labs  Lab 11/25/21 1227 11/25/21 1706 11/25/21 2054 11/26/21 0744 11/26/21 1219  GLUCAP 109* 117* 133*  100* 126*    Discharge time spent: greater than 30 minutes.  Signed: Nita Sells, MD [facility] 11/26/2021

## 2021-11-26 NOTE — Plan of Care (Signed)
  Problem: Education: Goal: Knowledge of General Education information will improve Description: Including pain rating scale, medication(s)/side effects and non-pharmacologic comfort measures Outcome: Progressing   Problem: Nutrition: Goal: Adequate nutrition will be maintained Outcome: Progressing   Problem: Activity: Goal: Risk for activity intolerance will decrease Outcome: Progressing   

## 2021-11-26 NOTE — Consult Note (Signed)
Consultation Note Date: 11/26/2021   Patient Name: Ann Mcconnell  DOB: Feb 24, 1946  MRN: [id number]  Age / Sex: 76 y.o., female  PCP: Maury Dus, MD Referring Physician: Nita Sells, MD  Reason for Consultation: Establishing goals of care  HPI/Patient Profile: 76 y.o. female  admitted on 11/24/2021   Clinical Assessment and Goals of Care: 76 year old lady who lives at home, history of [name] cirrhosis with ascites, history of recurrent hepatic encephalopathy.  Also has underlying diabetes obesity hypothyroidism.  Patient presented to the emergency department with weakness and near syncope after having a bowel movement at home.  Was on Xifaxan and lactulose, has recently required paracenteses twice in the last 2 weeks due to rapid reaccumulation of fluid.  Not able to be placed on diuretics due to rising serum creatinine.  Was recently discharged from the hospital on 11-22-2021.  Patient admitted to hospital medicine service, seen and evaluated by GI.  Palliative consultation has been requested for goals of care discussions.  Patient is awake alert resting in bed.  She states that she discussed in detail with her son and daughter-in-law last night about her current condition.  She understands the serious nature of her illness, she understands the irreversible nature of her illness.  We spent some time talking about end-stage liver disease, her worsening kidney function and signs and symptoms to be expected as a result of this process.  We discussed about how having hospice services at home can help her achieve her goals of avoiding suffering and being as comfortable as possible. Call placed and I was able to reach Roxie patient's daughter-in-law. Palliative medicine is specialized medical care for people living with serious illness. It focuses on providing relief from the symptoms and stress of a serious  illness. The goal is to improve quality of life for both the patient and the family. Goals of care: Broad aims of medical therapy in relation to the patient's values and preferences. Our aim is to provide medical care aimed at enabling patients to achieve the goals that matter most to them, given the circumstances of their particular medical situation and their constraints.  Brief life review performed.  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  She is described as a warm caring individual.  Discussed again with Helene Kelp about patient's current condition and appropriateness of hospice services.  I also spent some time speaking with the patient directly about her CODE STATUS.  We discussed in detail about differences between full code versus DO NOT RESUSCITATE.  Patient states that she is still coming to terms with how seriously ill she is and believes that things are happening too quickly.  She understands the concept of DNR/DNI however wishes for remaining full code for now and will continue these discussions with hospice staff at home in her familiar surroundings.  Offered active listening supportive presence and other therapeutic techniques in this initial interaction with the patient.  See below.  HCPOA  Son and daughter in law.   SUMMARY OF RECOMMENDATIONS   Full Code for now Home with Hospice.  Thank you for the consult.   Code Status/Advance Care Planning: Full code   Symptom Management:    Palliative Prophylaxis:  Delirium Protocol  Psycho-social/Spiritual:  Desire for further Chaplaincy support:yes Additional Recommendations: Education on Hospice  Prognosis:  < 6 months  Discharge Planning: Home with Hospice      Primary Diagnoses: Present on Admission:  Hepatic encephalopathy  Cirrhosis of liver with ascites ([place])  Hypothyroidism  Morbid obesity ([place])   I have reviewed the medical record,  interviewed the patient and family, and examined the patient. The following aspects are pertinent.  Past Medical History:  Diagnosis Date   Acute upper GI bleed 05/02/2021   Cirrhosis ([place])    Diabetes mellitus without complication ([place])    DOE (dyspnea on exertion)    Gastrointestinal hemorrhage 04/30/2021   GAVE (gastric antral vascular ectasia)    Gout    HLD (hyperlipidemia)    HTN (hypertension)    Hypothyroidism    Tachycardia    Social History   Socioeconomic History   Marital status: Widowed    Spouse name: Not on file   Number of children: 1   Years of education: Not on file   Highest education level: Not on file  Occupational History   Occupation: retired  Tobacco Use   Smoking status: Never   Smokeless tobacco: Never  Substance and Sexual Activity   Alcohol use: No   Drug use: Never   Sexual activity: Not on file  Other Topics Concern   Not on file  Social History Narrative   Not on file   Social Determinants of Health   Financial Resource Strain: Not on file  Food Insecurity: Not on file  Transportation Needs: Not on file  Physical Activity: Not on file  Stress: Not on file  Social Connections: Not on file   Family History  Problem Relation Age of Onset   Heart attack Other    Lymphoma Other    COPD Other    Thyroid disease Other    Sleep apnea Other    Scheduled Meds:  insulin aspart  0-5 Units Subcutaneous QHS   insulin aspart  0-9 Units Subcutaneous TID WC   lactulose  20 g Oral TID   levothyroxine  100 mcg Oral QHS   lidocaine       rifaximin  550 mg Oral BID   Continuous Infusions:  albumin human     PRN Meds:. Medications Prior to Admission:  Prior to Admission medications   Medication Sig Start Date End Date Taking? Authorizing Provider  allopurinol (ZYLOPRIM) 300 MG tablet Take 150 mg by mouth daily.   Yes [provider]  busPIRone (BUSPAR) 10 MG tablet Take 10 mg by mouth daily.   Yes [provider]   citalopram (CELEXA) 40 MG tablet Take 40 mg by mouth daily. 03/31/21  Yes [provider]  ferrous sulfate 325 (65 FE) MG tablet Take 1 tablet (325 mg total) by mouth daily with breakfast. 07/28/21  Yes Armbruster, Carlota Raspberry, MD  Glucosamine HCl (GLUCOSAMINE PO) Take 1 tablet by mouth daily.   Yes [provider]  lactulose (CHRONULAC) 10 GM/15ML solution Take 30 mLs (20 g total) by mouth 3 (three) times daily. 11/22/21  Yes Manuella Ghazi, Pratik D, DO  levothyroxine (SYNTHROID) 100 MCG tablet Take 100 mcg by mouth daily. 02/09/21  Yes [provider]  metoprolol succinate (TOPROL-XL) 100 MG 24 hr tablet Take 1 tablet (100 mg total) by mouth daily. Take with or immediately following a meal. 11/23/21 12/23/21 Yes Shah, Pratik D, DO  pravastatin (PRAVACHOL) 40 MG tablet Take 40 mg by mouth daily.   Yes [provider]  rifaximin (XIFAXAN) 550 MG TABS tablet Take 1 tablet (550 mg total) by mouth 2 (two) times daily. 11/22/21 12/22/21 Yes Shah, Pratik D, DO  vitamin C (ASCORBIC ACID) 500 MG tablet Take 500 mg by mouth daily.   Yes [provider]  Accu-Chek FastClix Lancets MISC Apply topically. 06/01/21   [provider]  ACCU-CHEK GUIDE test strip  05/27/21   [provider]  Cholecalciferol (VITAMIN D3 PO) Take 1 Dose by mouth daily. Patient not taking: Reported on 11/17/2021    [provider]  pantoprazole (PROTONIX) 40 MG tablet Take 1 tablet (40 mg total) by mouth in the morning. Patient not taking: Reported on 11/17/2021 06/06/21 08/05/21  Noralyn Pick, NP   No Known Allergies Review of Systems +weakness Physical Exam Awake alert Distended abdomen Has edema No focal deficits Regular work of breathing   Vital Signs: BP 121/75 (BP Location: Left Arm)    Pulse 61    Temp 97.8 F (36.6 C) (Oral)    Resp 16    Ht 5\' 6"  (1.676 m)    Wt 106.8 kg    SpO2 100%    BMI 38.00 kg/m  Pain Scale: 0-10   Pain Score: 0-No pain   SpO2:  SpO2: 100 % O2 Device:SpO2: 100 % O2 Flow Rate: .   IO: Intake/output summary:  Intake/Output Summary (Last 24 hours) at 11/26/2021 0854 Last data filed at 11/26/2021 0347 Gross per 24 hour  Intake 0 ml  Output 100 ml  Net -100 ml    LBM: Last BM Date : 11/25/21 Baseline Weight: Weight: 105 kg Most recent weight: Weight: 106.8 kg     Palliative Assessment/Data:   PPS 50%  Time In: 7.40 Time Out: 8.40 Time Total: 60 min.  Greater than 50%  of this time was spent counseling and coordinating care related to the above assessment and plan.  Signed by: Loistine Chance, MD   Please contact Palliative Medicine Team phone at [phone] for questions and concerns.  For individual provider: See Shea Evans

## 2021-11-27 NOTE — Progress Notes (Signed)
[facility] arrived at the facility to transfer the patient home. Patient alert and oriented. Vital Signs: 98.8, 136/59, 66, 16, 100% (RA). Purewick and IV site discontinued prior to transport. No belongings noted at this time and patient reports family took belongings home already. Daughter-In-Law Helene Kelp) called made aware of patient ETA (15 mins) per [facility] driver.

## 2021-11-28 NOTE — Telephone Encounter (Signed)
Correction to earlier note, the pts brother was calling. More family members are calling in stating pt is bedridden and they really are not sure they will be able to come in for the visit tomorrow. Family states they will ask the pt if she wants to come for the visit. If she does not they plan on cancelling the appt as she is being seen by hospice in the home.

## 2021-11-28 NOTE — Telephone Encounter (Signed)
Spoke with pts son and let him know that she did need to keep the OV as scheduled. They can discuss hospice at the time of the [facility].

## 2021-11-28 NOTE — Telephone Encounter (Signed)
Best contact number [place], [phone]

## 2021-11-28 NOTE — Telephone Encounter (Signed)
Inbound call from patient brother. States there is discussion about patient going to hospice and patient brother would like to talk to make sure this is the best discussion. Also wants to know if she should keep the appt 2/22

## 2021-11-28 NOTE — Telephone Encounter (Signed)
Spoke to [place] at [facility], Post in [place] now so she will not need Orders now.

## 2021-11-28 NOTE — Telephone Encounter (Signed)
Family called back and put the pt on the phone and the pt stated that she wanted the appt cancelled for tomorrow. She states she cannot make the ride to the office any longer. Appt cancelled per pt request.

## 2021-11-30 ENCOUNTER — Ambulatory Visit: Payer: Medicare Other | Admitting: Gastroenterology

## 2021-12-01 LAB — CULTURE, BODY FLUID W GRAM STAIN -BOTTLE: Culture: NO GROWTH

## 2021-12-15 ENCOUNTER — Other Ambulatory Visit (HOSPITAL_COMMUNITY): Payer: Medicare Other

## 2022-01-07 DEATH — deceased

## 2022-02-15 ENCOUNTER — Telehealth: Payer: Self-pay | Admitting: Neurology

## 2022-02-15 NOTE — Telephone Encounter (Signed)
While verifying insurance for upcoming appointments I found out this patient passed away on 2022/01/02. I cancelled the 02/20/22 appointment. ?

## 2022-02-22 ENCOUNTER — Ambulatory Visit: Payer: Self-pay | Admitting: Neurology

## 2022-12-17 IMAGING — US US ABDOMEN LIMITED
1 series · 15 of 25 positions shown · non-contrast
Comparison: Ultrasound dated 05/01/2021.

CLINICAL DATA: Cirrhosis.  HCC screening.

EXAM:
ULTRASOUND ABDOMEN LIMITED RIGHT UPPER QUADRANT

[Series 1: us abdomen limited ruq mc & wl · 15 of 52 slices shown]
[im 1/52]
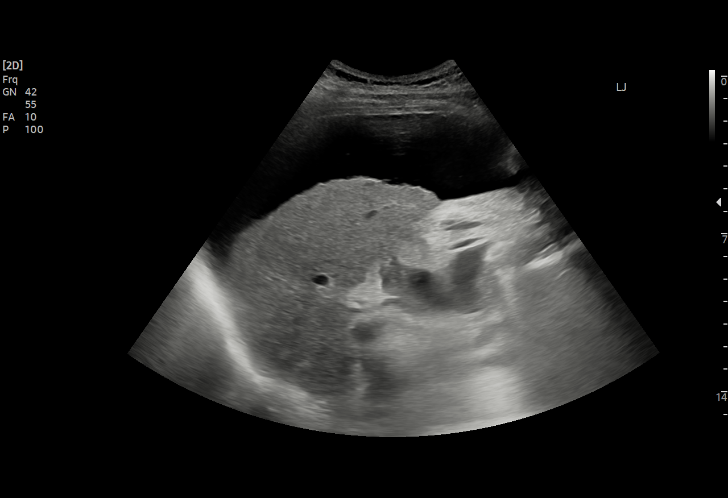
[im 5/52]
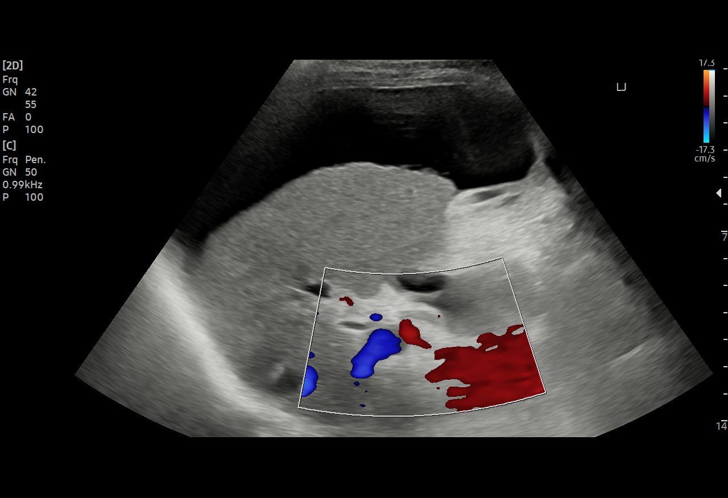
[im 9/52]
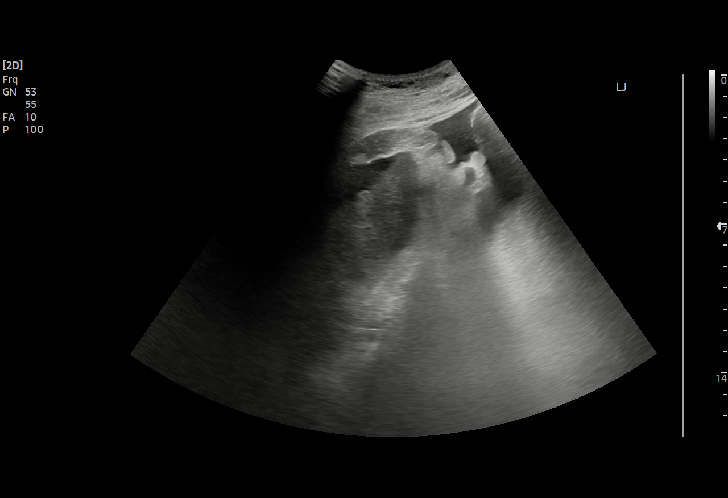
[im 11/52]
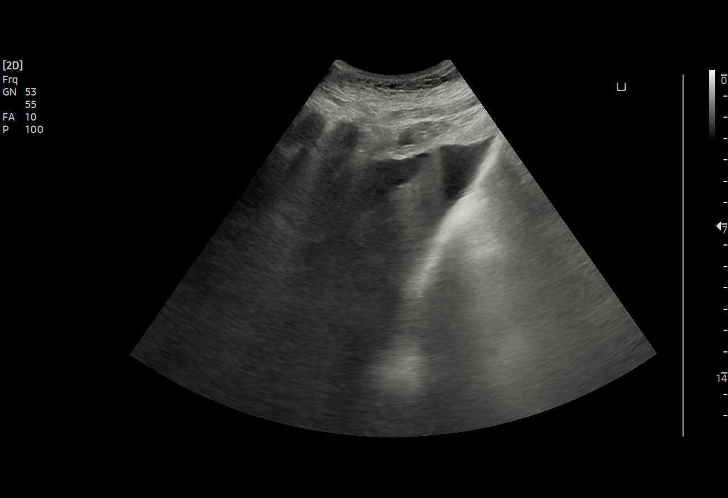
[im 15/52]
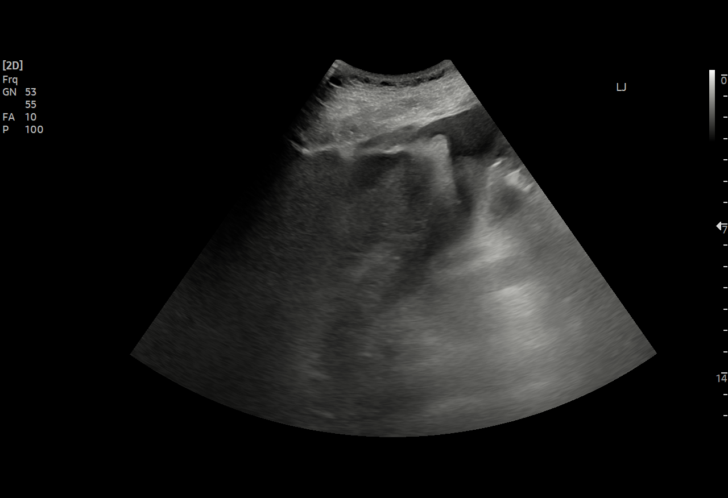
[im 20/52]
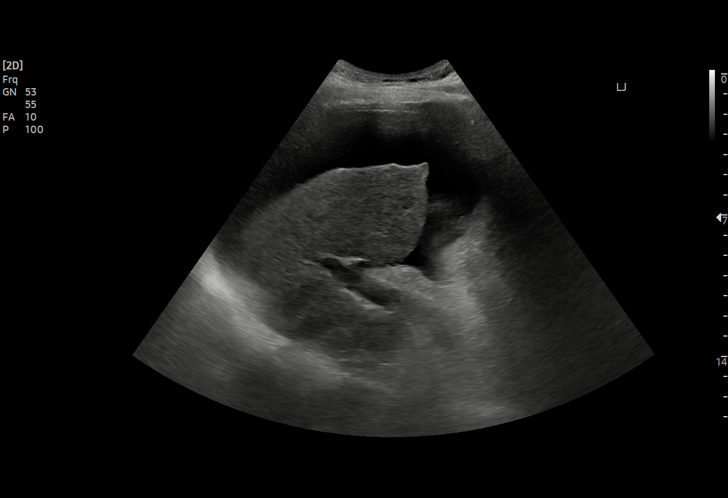
[im 22/52]
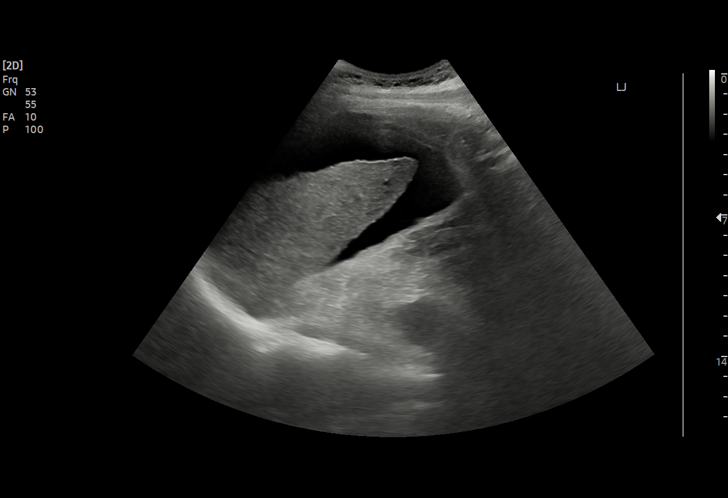
[im 26/52]
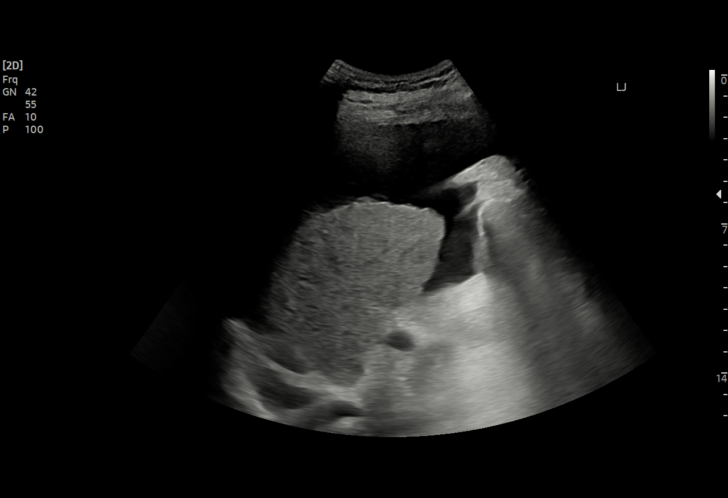
[im 30/52]
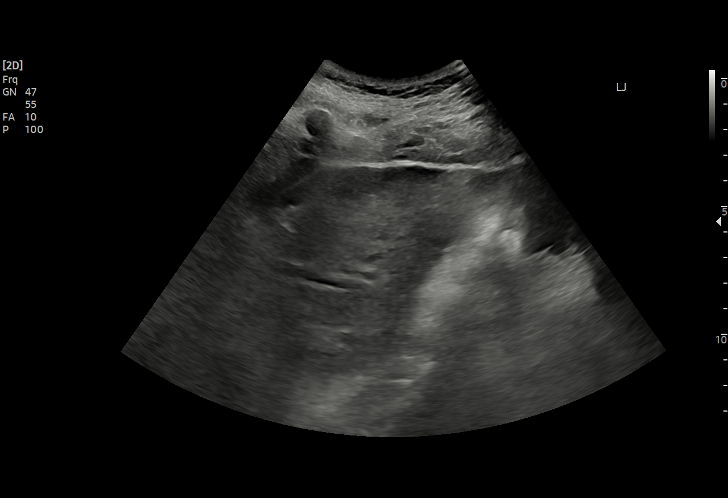
[im 32/52]
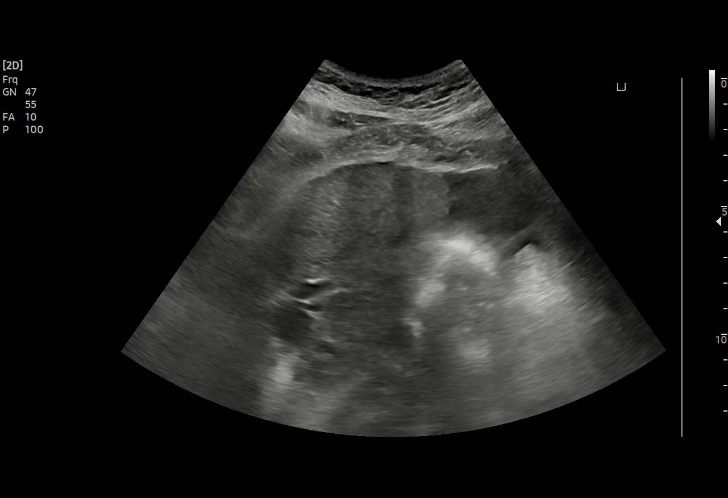
[im 37/52]
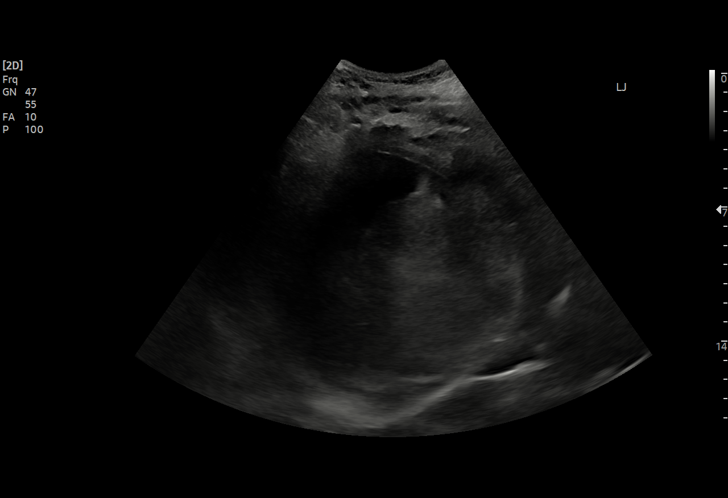
[im 41/52]
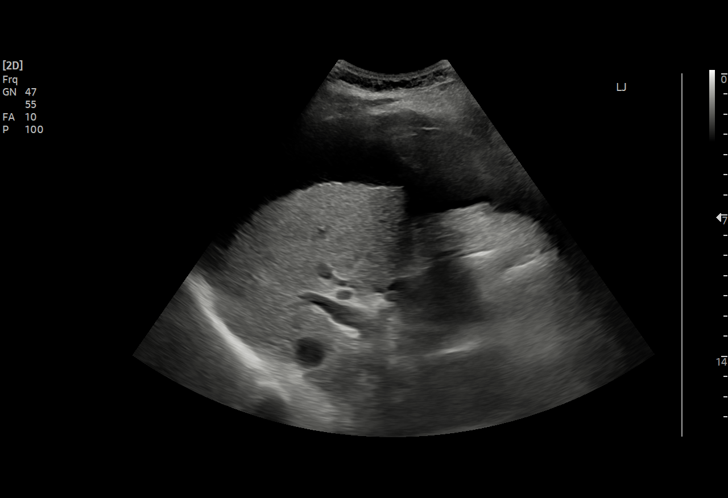
[im 43/52]
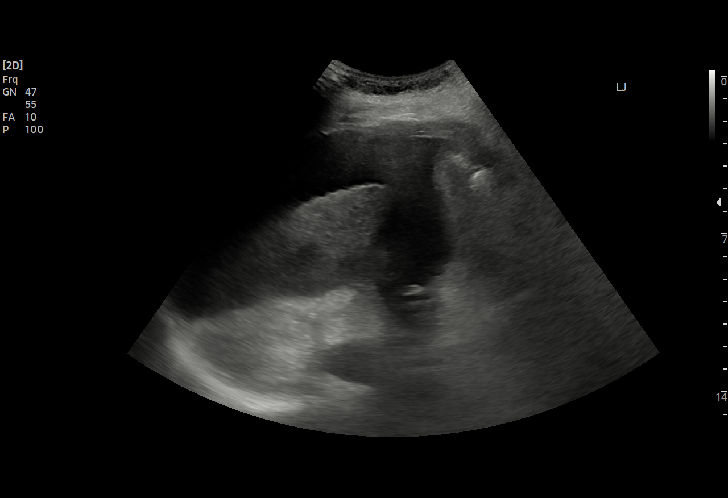
[im 47/52]
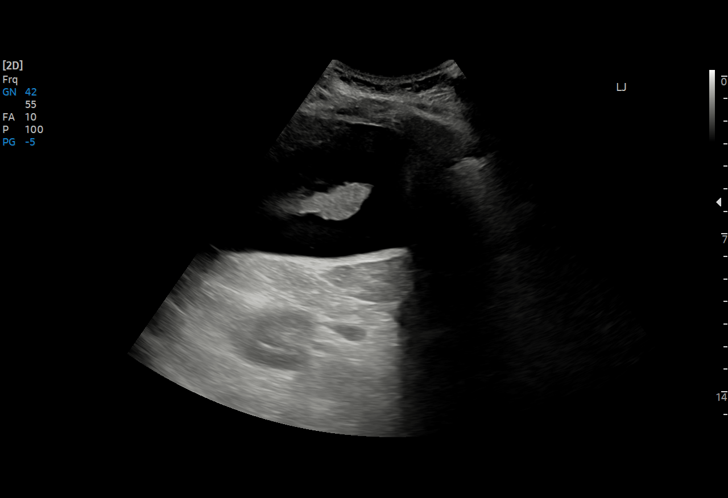
[im 52/52]
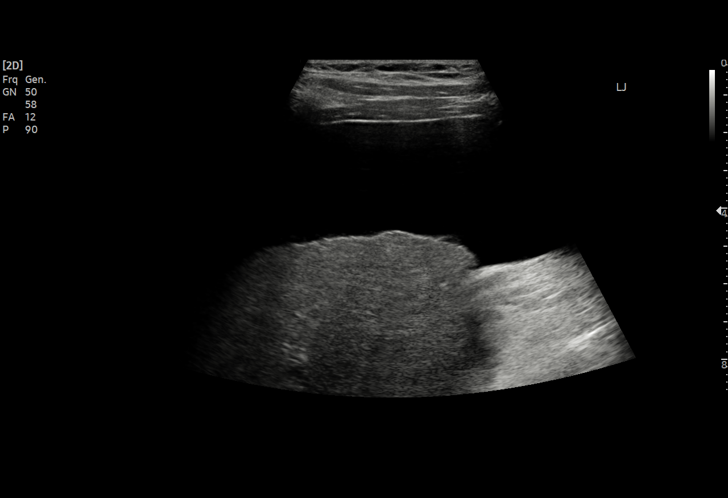

[15 of 25 positions shown; findings below may reference images not displayed]

FINDINGS: Gallbladder:

Cholecystectomy.

Common bile duct:

Diameter: 4 mm

Liver:

Morphologic changes of cirrhosis. No discrete mass identified.
Portal vein is patent on color Doppler imaging with normal direction
of blood flow towards the liver.

Other: Partially visualized moderate ascites.
IMPRESSION: 1. Cirrhosis with ascites.
2. Patent main portal vein with hepatopetal flow.

## 2022-12-17 IMAGING — CT CT HEAD W/O CM
4 series · 16 of 47 positions shown, 18 images · non-contrast
Comparison: None.

CLINICAL DATA: Frequent falls, unsteady gait

EXAM:
CT HEAD WITHOUT CONTRAST
TECHNIQUE: Contiguous axial images were obtained from the base of the skull
through the vertex without intravenous contrast.

[Series 2: head 5.00 hr40 s3 axial ibhc · axial · 0.44mm/px · z∈[-666,-561]mm · 6 of 31 slices shown, 8 images]
[im 5/31  brain]
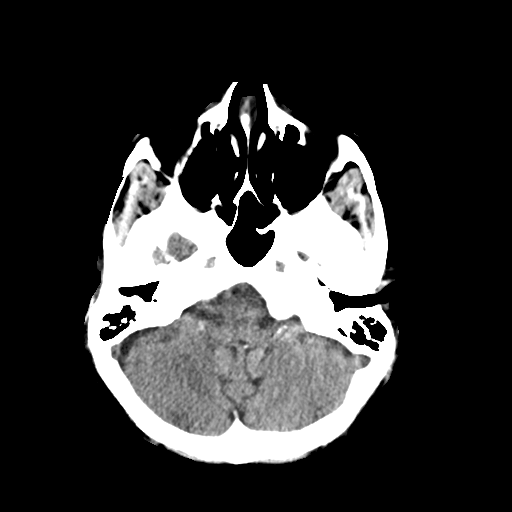
[im 5/31  bone]
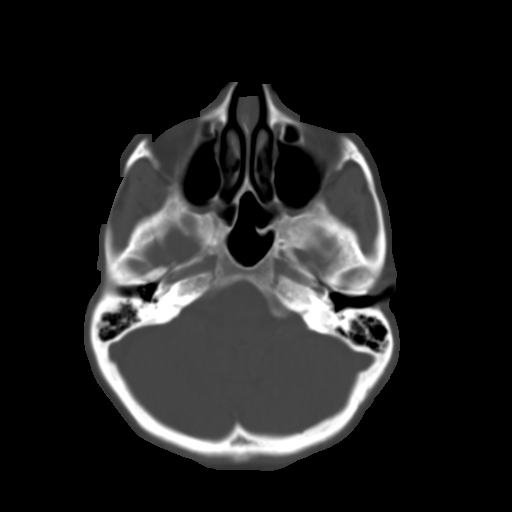
[im 9/31  brain]
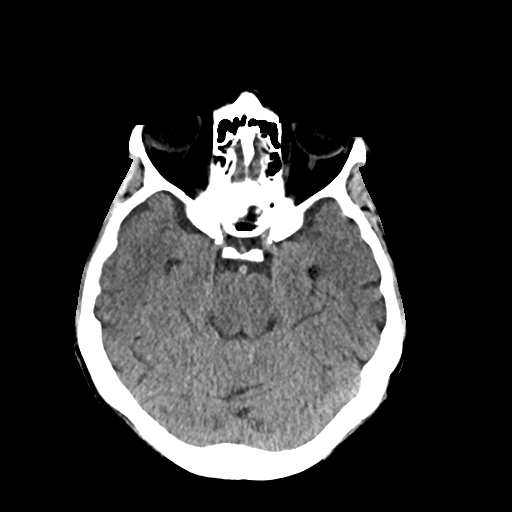
[im 13/31  brain]
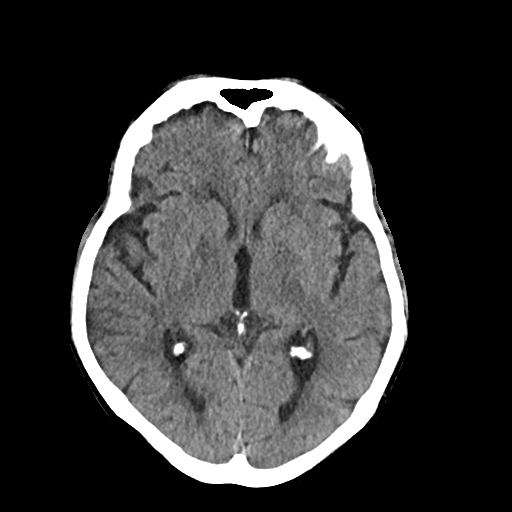
[im 18/31  brain]
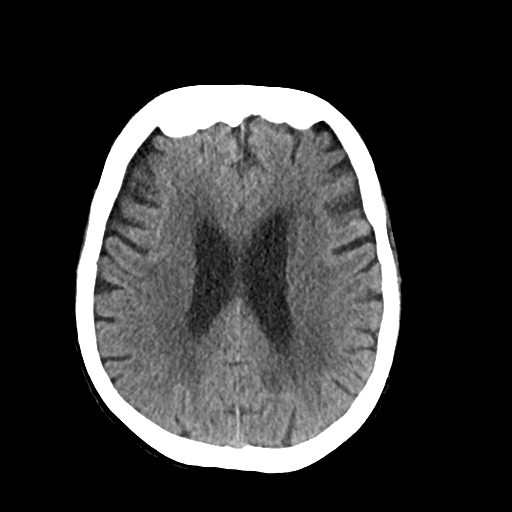
[im 22/31  brain]
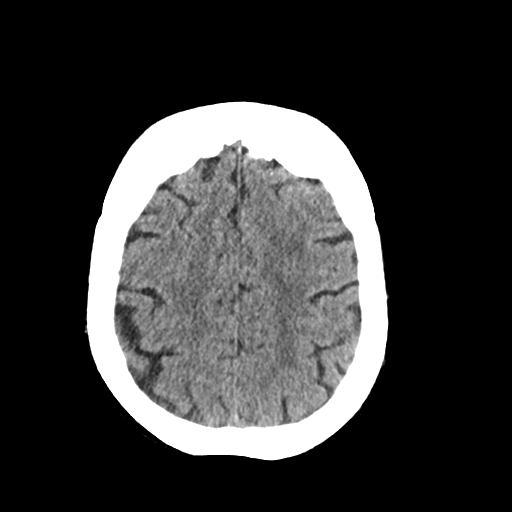
[im 22/31  bone]
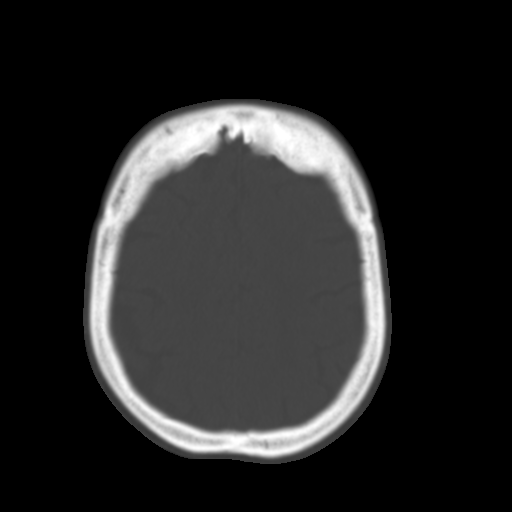
[im 26/31  brain]
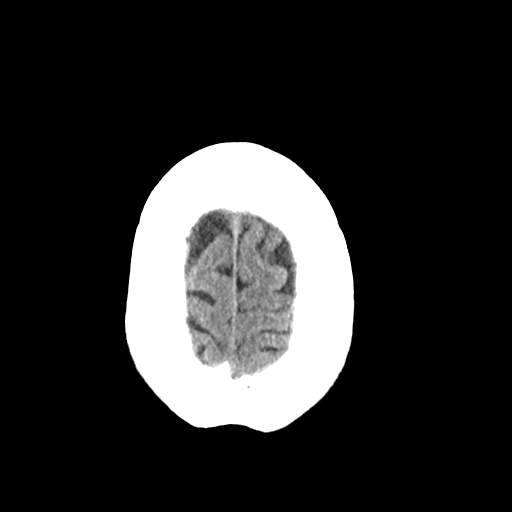

[Series 3: head 2.00 hr60 s3 axial bone · axial · 0.44mm/px · z∈[-673,-621]mm · 4 of 79 slices shown]
[im 8/79  bone]
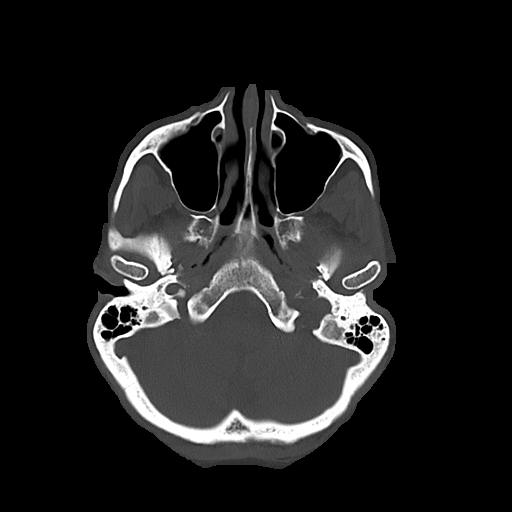
[im 15/79  bone]
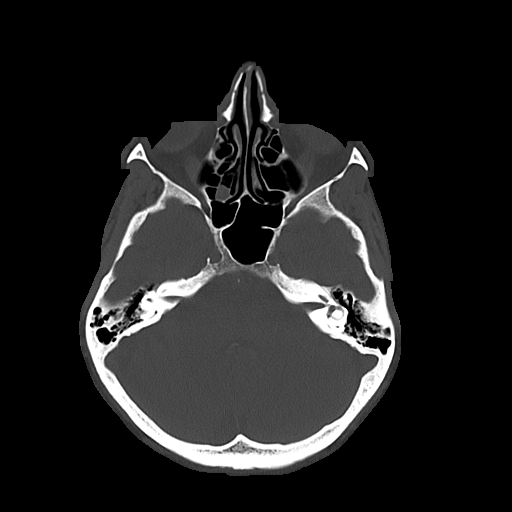
[im 27/79  bone]
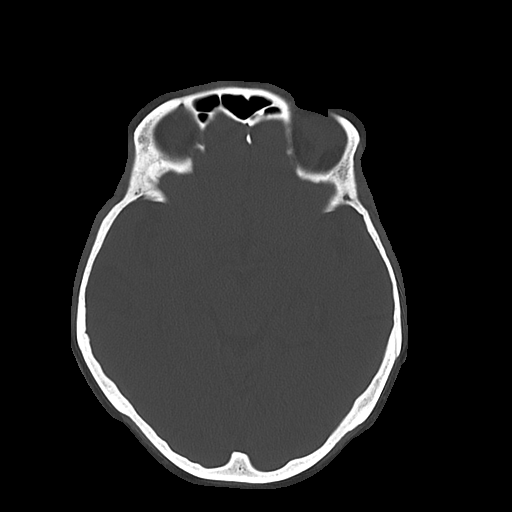
[im 34/79  bone]
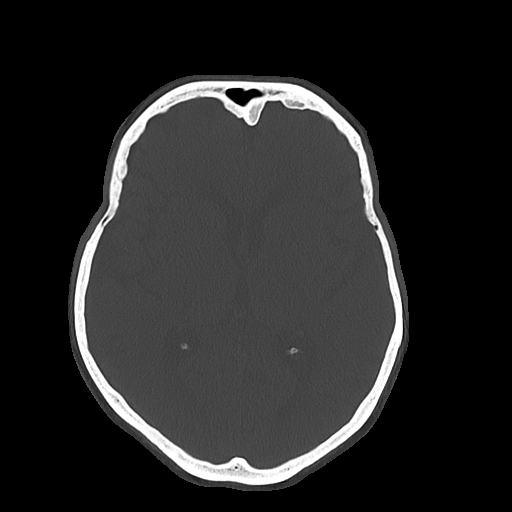

[Series 4: head 3.00 hr40 s3 sag · sagittal · 0.30mm/px · 3 of 59 slices shown]
[im 20/59  brain]
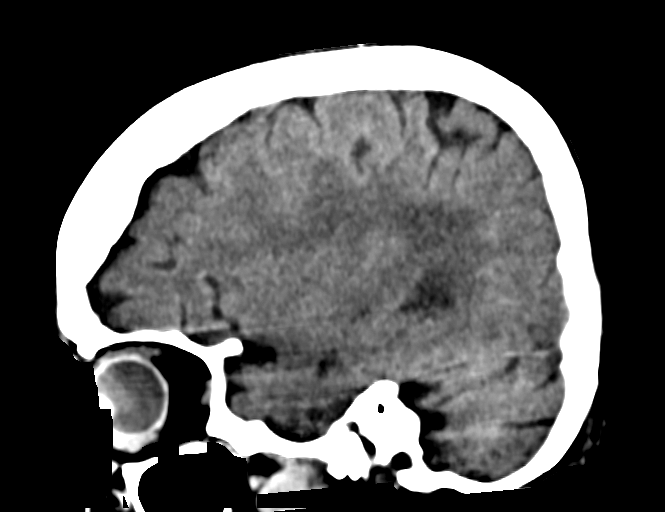
[im 30/59  brain]
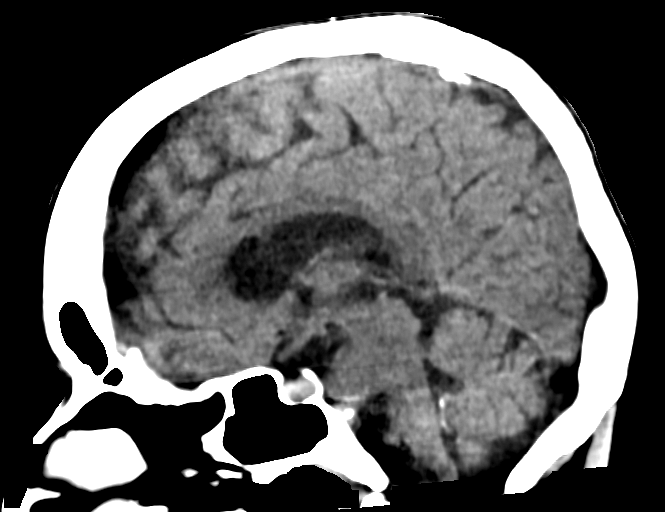
[im 39/59  brain]
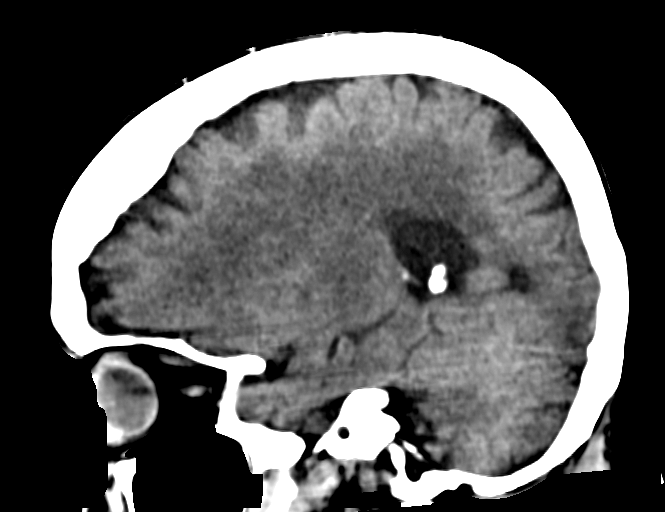

[Series 6: head 3.00 hr40 s3 cor · coronal · 0.30mm/px · 3 of 66 slices shown]
[im 22/66  brain]
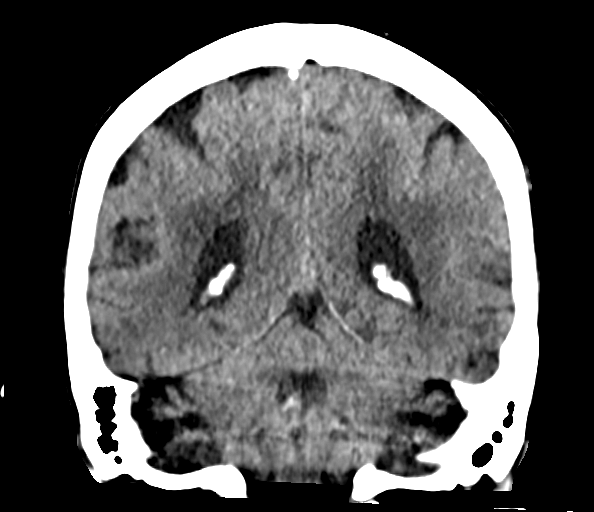
[im 29/66  brain]
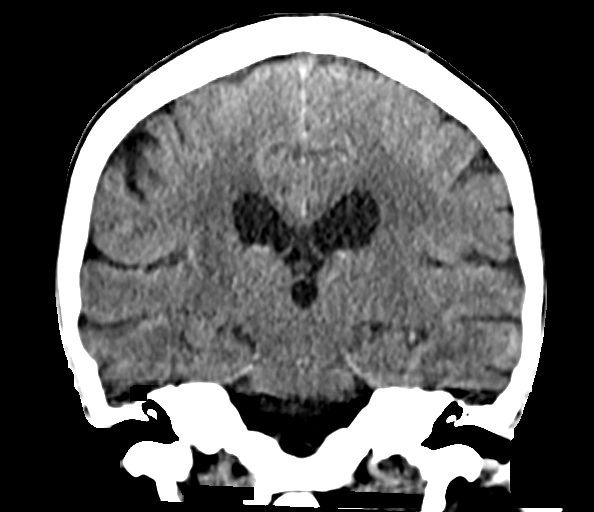
[im 37/66  brain]
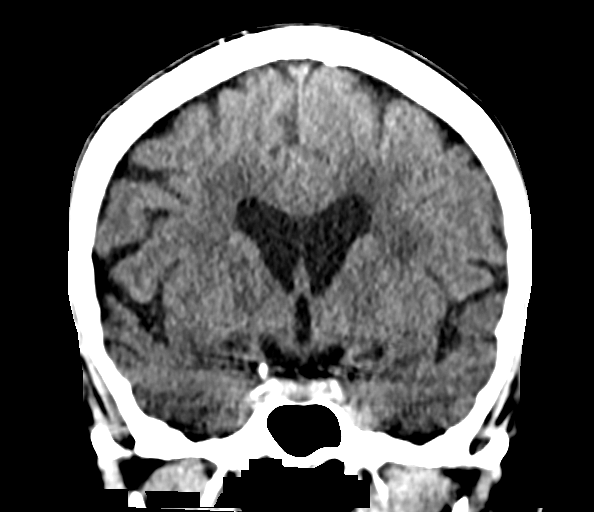

[16 of 47 positions shown; findings below may reference images not displayed]

FINDINGS: Brain: There is no acute intracranial hemorrhage, mass effect, or
edema. Gray-white differentiation is preserved. There is no
extra-axial fluid collection. Ventricles and sulci are within normal
limits in size and configuration. Patchy and confluent low-density
in the supratentorial white matter is nonspecific but may reflect
moderate chronic microvascular ischemic changes.

Vascular: There is atherosclerotic calcification at the skull base.

Skull: Calvarium is unremarkable.

Sinuses/Orbits: No acute finding.

Other: None.
IMPRESSION: No acute intracranial abnormality.

Chronic microvascular ischemic changes.

## 2023-01-15 IMAGING — US IR PARACENTESIS
1 series · 4 of 4 positions shown · non-contrast
Comparison: none

INDICATION: Ascites, cirrhosis

[Series 1: ir (person_name)/(person_name) · 4 of 4 slices shown]
[im 1/4]
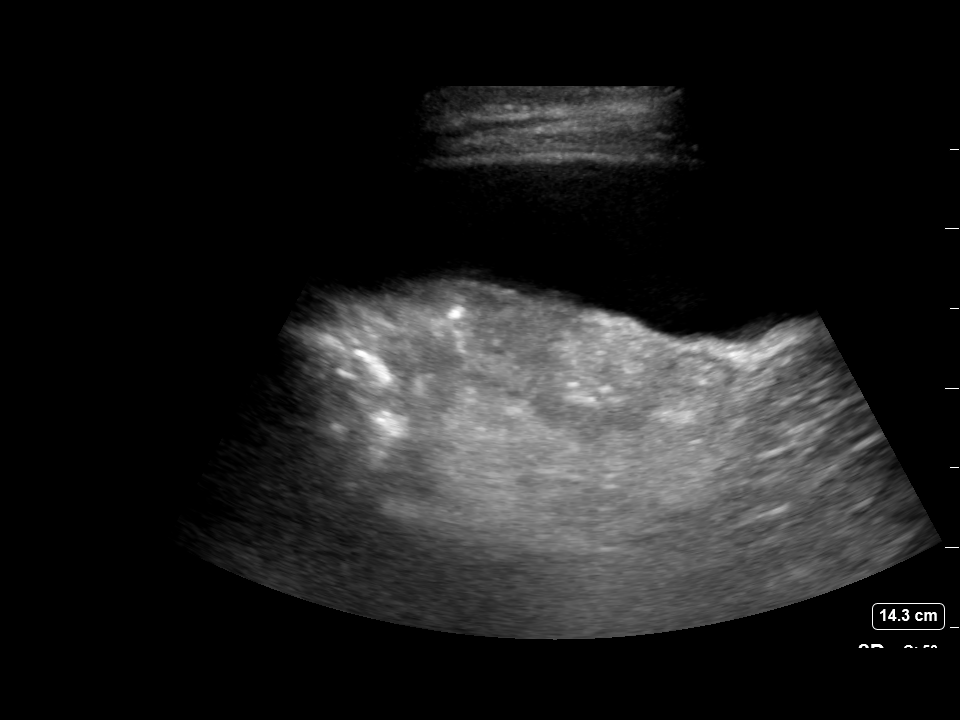
[im 2/4]
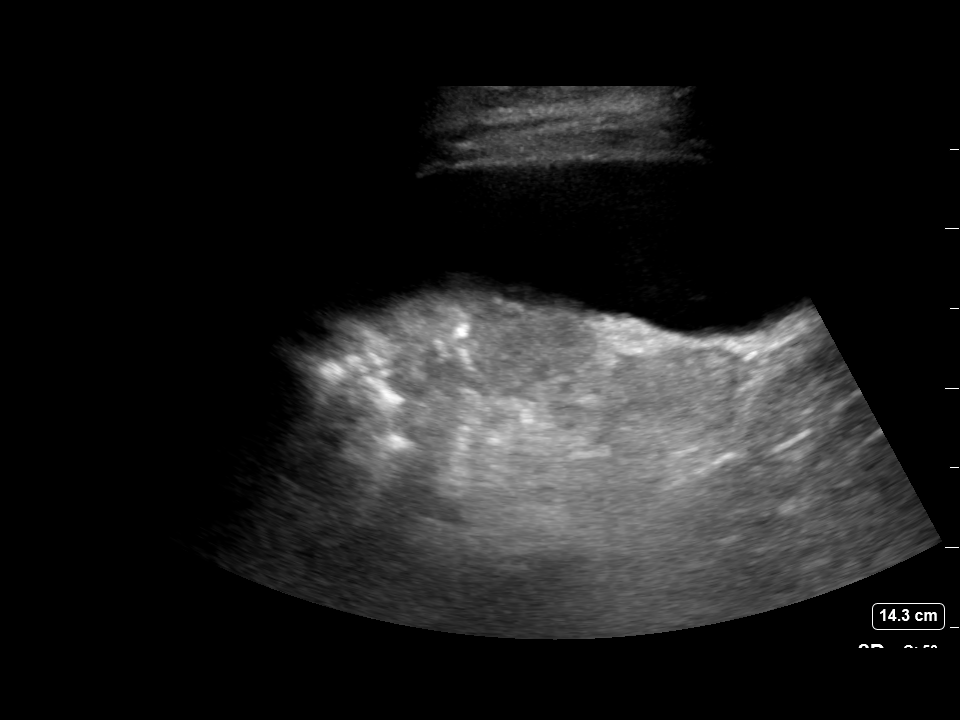
[im 3/4]
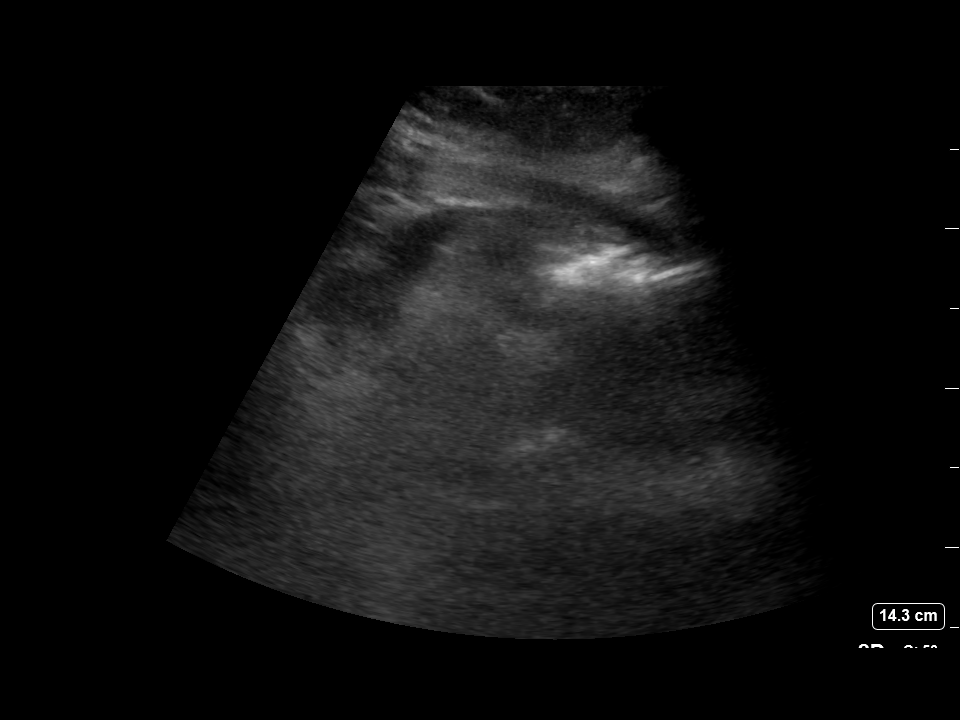
[im 4/4]
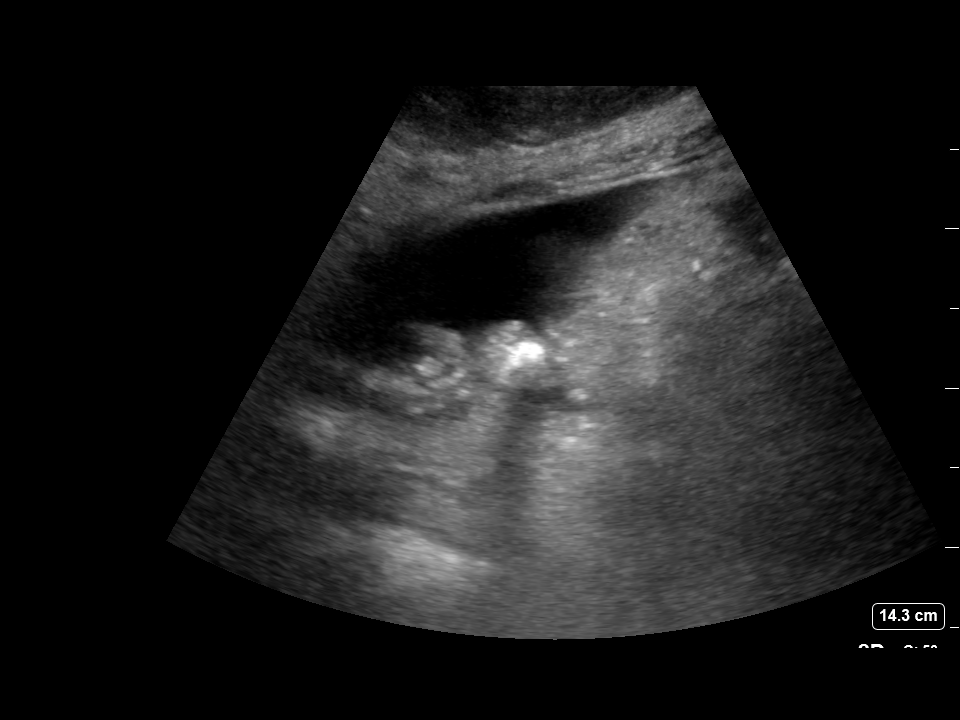

[4 of 4 positions shown; findings below may reference images not displayed]

EXAM:
ULTRASOUND GUIDED THERAPEUTIC PARACENTESIS

MEDICATIONS:
None.

COMPLICATIONS:
None immediate.

PROCEDURE:
Informed written consent was obtained from the patient after a
discussion of the risks, benefits and alternatives to treatment. A
timeout was performed prior to the initiation of the procedure.

Initial ultrasound scanning demonstrates a large amount of ascites
within the right lower abdominal quadrant. The right lower abdomen
was prepped and draped in the usual sterile fashion. 1% lidocaine
was used for local anesthesia.

Following this, a 19 gauge, 7-cm, Yueh catheter was introduced. An
ultrasound image was saved for documentation purposes. The
paracentesis was performed. The catheter was removed and a dressing
was applied. The patient tolerated the procedure well without
immediate post procedural complication.
FINDINGS: A total of approximately 4.4L of serous ascitic fluid was removed.
IMPRESSION: Successful ultrasound-guided paracentesis yielding 4.4 liters of
peritoneal fluid.

Performed and Read by Makuniene, Viskas Vaikams

## 2023-01-21 IMAGING — US US PARACENTESIS
1 series · 8 of 8 positions shown · non-contrast
Comparison: none

INDICATION: Patient with history of NASH cirrhosis, chronic kidney disease,
encephalopathy, recurrent ascites. Request received for therapeutic
paracentesis.

[Series 1: us paracentesis mc & wl · 8 of 8 slices shown]
[im 1/8]
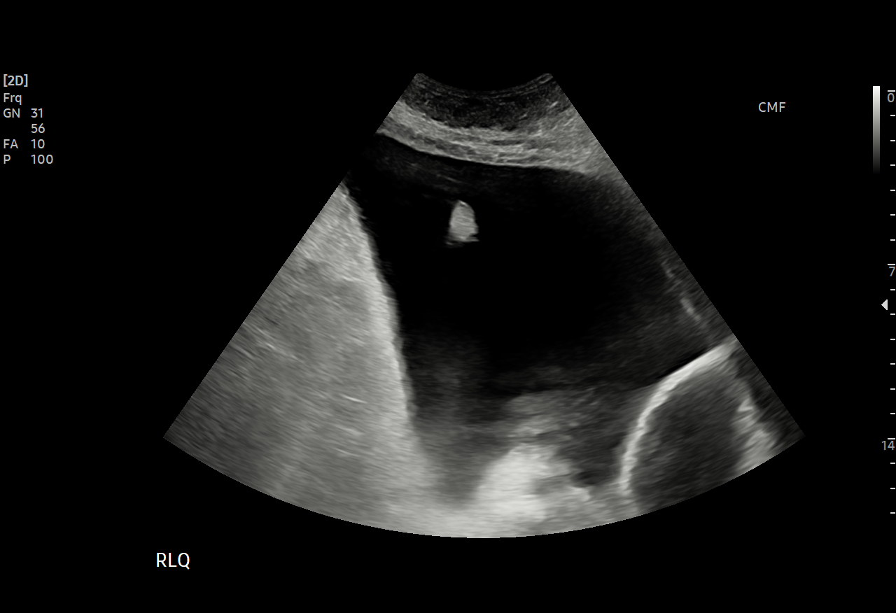
[im 2/8]
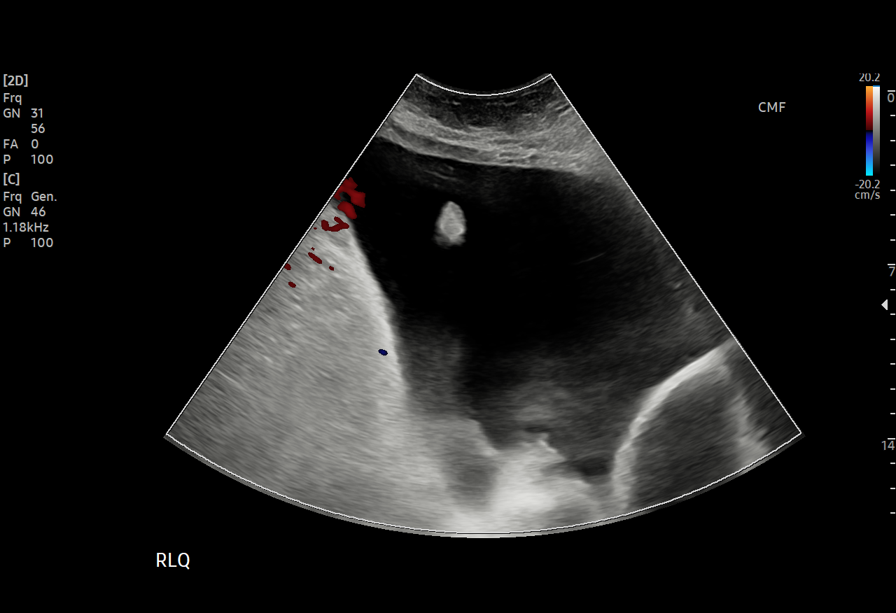
[im 3/8]
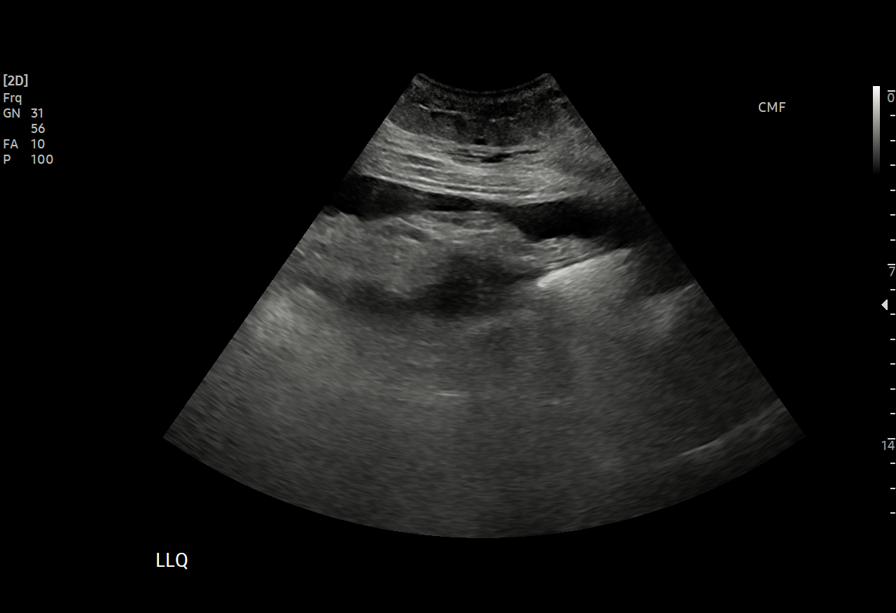
[im 4/8]
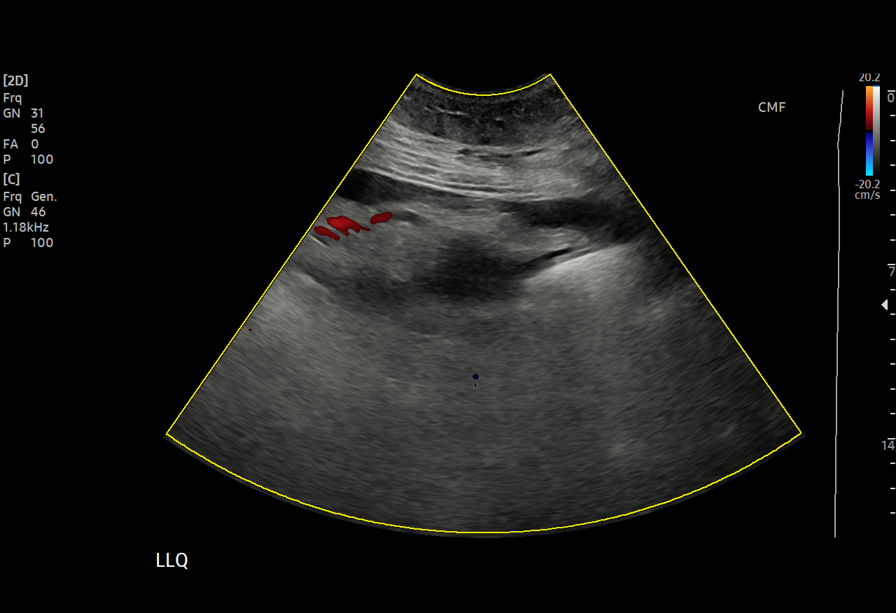
[im 5/8]
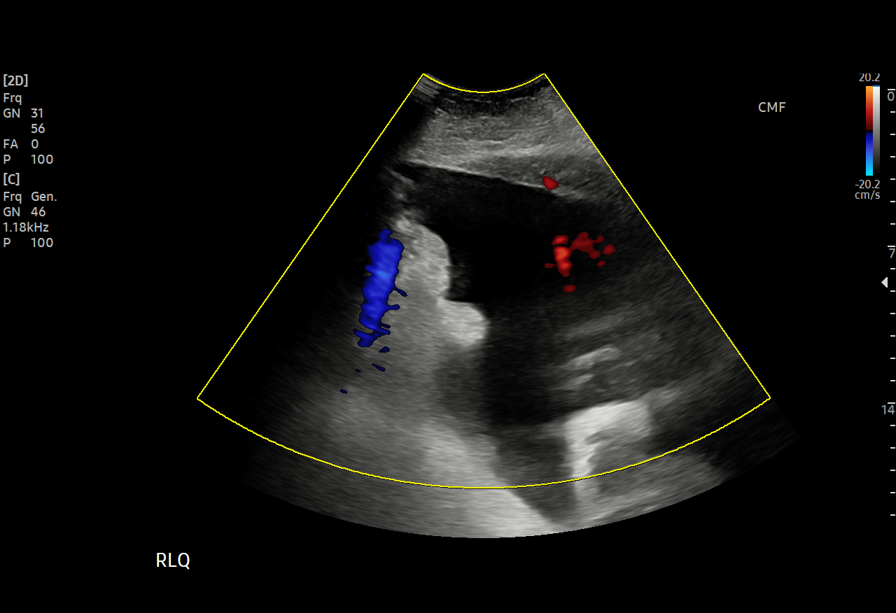
[im 6/8]
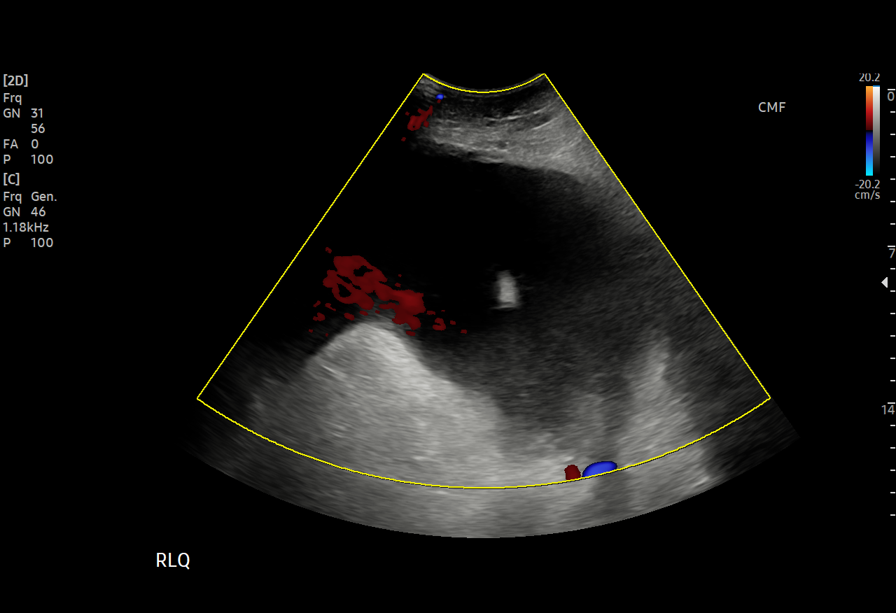
[im 7/8]
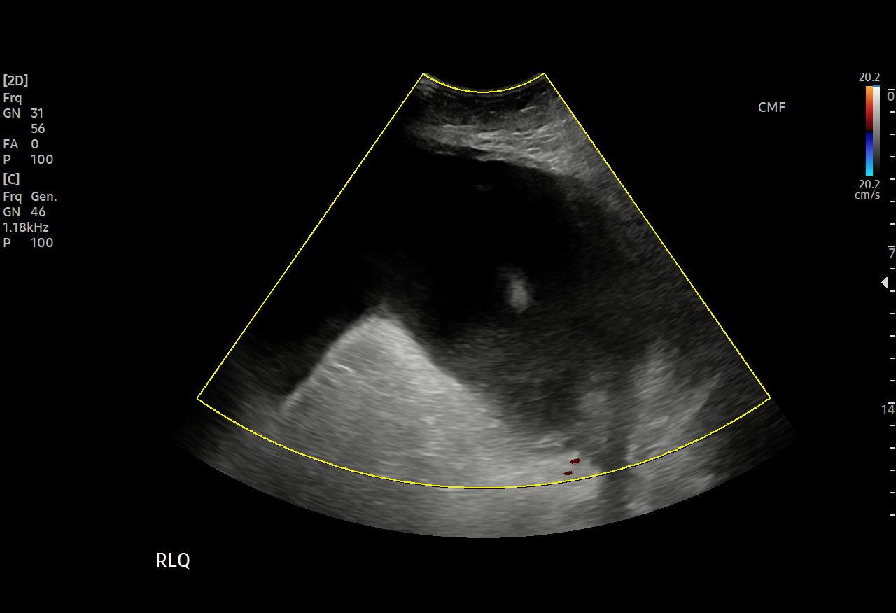
[im 8/8]
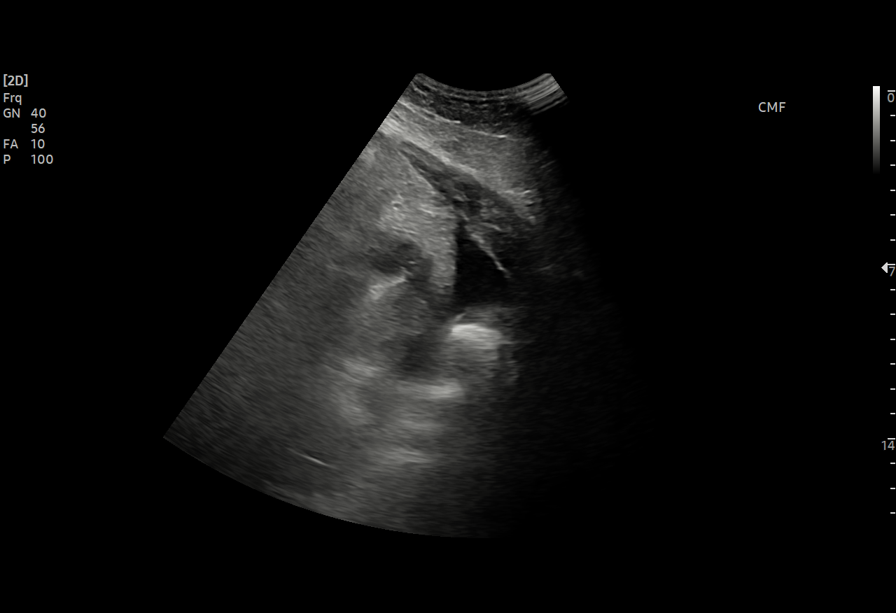

[8 of 8 positions shown; findings below may reference images not displayed]

EXAM:
ULTRASOUND GUIDED THERAPEUTIC PARACENTESIS

MEDICATIONS:
10 mL 1% lidocaine

COMPLICATIONS:
None immediate.

PROCEDURE:
Informed written consent was obtained from the patient/sister after
a discussion of the risks, benefits and alternatives to treatment. A
timeout was performed prior to the initiation of the procedure.

Initial ultrasound scanning demonstrates a moderate to large amount
of ascites within the right lower abdominal quadrant. The right
lower abdomen was prepped and draped in the usual sterile fashion.
1% lidocaine was used for local anesthesia.

Following this, a 19 gauge, 10-cm, Yueh catheter was introduced. An
ultrasound image was saved for documentation purposes. The
paracentesis was performed. The catheter was removed and a dressing
was applied. The patient tolerated the procedure well without
immediate post procedural complication.
FINDINGS: A total of approximately 4.4 liters of yellow fluid was removed.
IMPRESSION: Successful ultrasound-guided therapeutic paracentesis yielding
liters of peritoneal fluid.

## 2023-01-26 IMAGING — US US PARACENTESIS
1 series · 3 of 3 positions shown · non-contrast
Comparison: none

INDICATION: Patient with history of NASH cirrhosis, CKD, encephalopathy with
recurrent ascites. Request received for diagnostic and therapeutic
paracentesis.

[Series 1: us paracentesis · 3 of 3 slices shown]
[im 1/3]
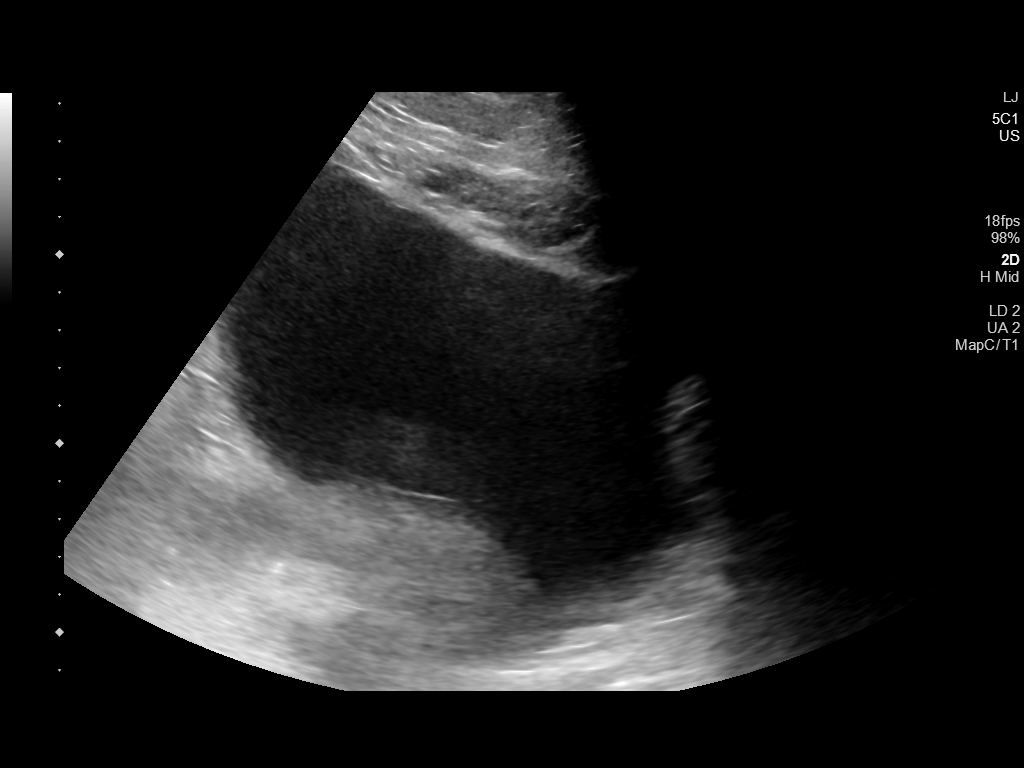
[im 2/3]
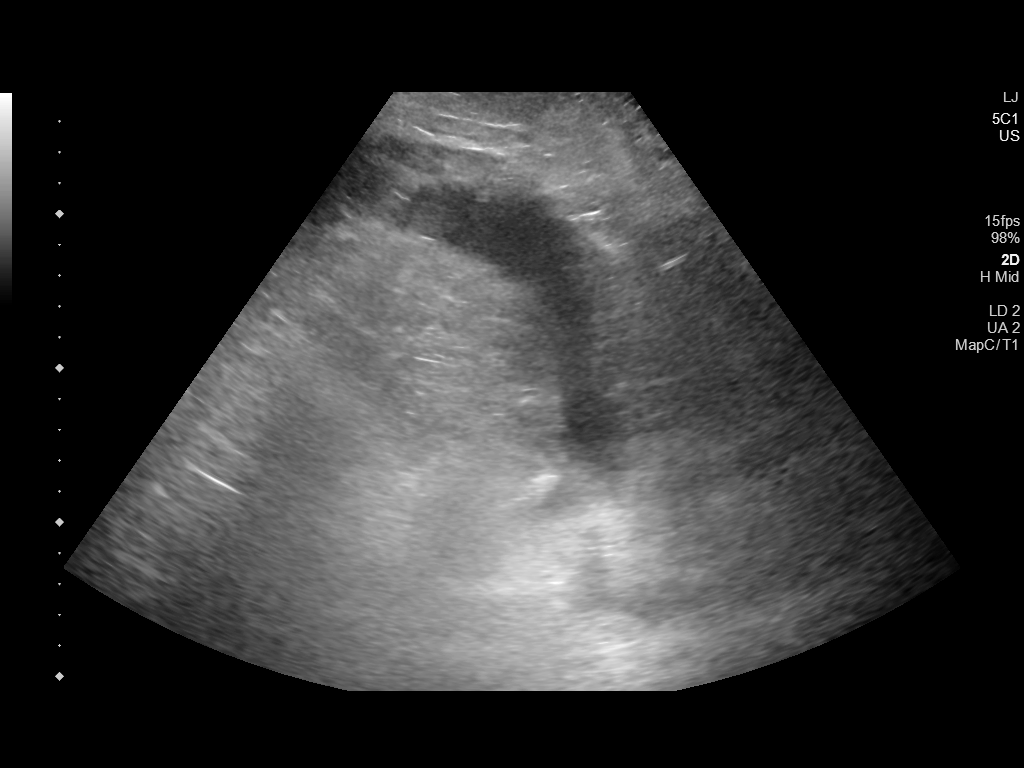
[im 3/3]
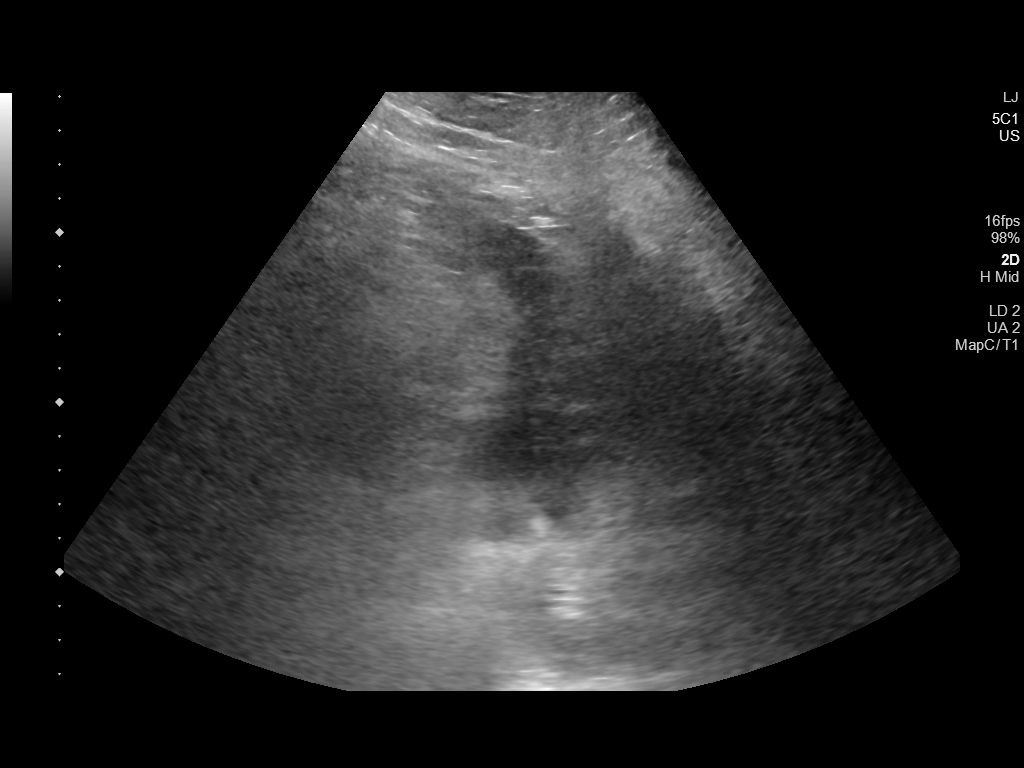

[3 of 3 positions shown; findings below may reference images not displayed]

EXAM:
ULTRASOUND GUIDED DIAGNOSTIC AND THERAPEUTIC RIGHT LOWER QUADRANT
PARACENTESIS

MEDICATIONS:
10 mL 1 % lidocaine

COMPLICATIONS:
None immediate.

PROCEDURE:
Informed written consent was obtained from the patient after a
discussion of the risks, benefits and alternatives to treatment. A
timeout was performed prior to the initiation of the procedure.

Initial ultrasound scanning demonstrates a large amount of ascites
within the right lower abdominal quadrant. The right lower abdomen
was prepped and draped in the usual sterile fashion. 1% lidocaine
was used for local anesthesia.

Following this, a 19 gauge, 10-cm, Yueh catheter was introduced. An
ultrasound image was saved for documentation purposes. The
paracentesis was performed. The catheter was removed and a dressing
was applied. The patient tolerated the procedure well without
immediate post procedural complication.
Patient received post-procedure intravenous albumin; see nursing
notes for details.
FINDINGS: A total of approximately 3.5 L of clear, straw-colored fluid was
removed. Samples were sent to the laboratory as requested by the
clinical team.
IMPRESSION: Successful ultrasound-guided paracentesis yielding 3.5 liters of
peritoneal fluid.
